# Patient Record
Sex: Female | Born: 1960
Health system: Southern US, Community
[De-identification: ages and names within clinical notes are randomized; demographics above are authoritative.]

## PROBLEM LIST (undated history)

## (undated) DIAGNOSIS — G4730 Sleep apnea, unspecified: Secondary | ICD-10-CM

## (undated) DIAGNOSIS — J3801 Paralysis of vocal cords and larynx, unilateral: Secondary | ICD-10-CM

## (undated) DIAGNOSIS — T8859XA Other complications of anesthesia, initial encounter: Secondary | ICD-10-CM

## (undated) DIAGNOSIS — I10 Essential (primary) hypertension: Secondary | ICD-10-CM

## (undated) DIAGNOSIS — M5416 Radiculopathy, lumbar region: Secondary | ICD-10-CM

## (undated) DIAGNOSIS — L729 Follicular cyst of the skin and subcutaneous tissue, unspecified: Secondary | ICD-10-CM

## (undated) DIAGNOSIS — M1990 Unspecified osteoarthritis, unspecified site: Secondary | ICD-10-CM

## (undated) DIAGNOSIS — E079 Disorder of thyroid, unspecified: Secondary | ICD-10-CM

## (undated) DIAGNOSIS — R011 Cardiac murmur, unspecified: Secondary | ICD-10-CM

## (undated) DIAGNOSIS — F32A Depression, unspecified: Secondary | ICD-10-CM

## (undated) DIAGNOSIS — J40 Bronchitis, not specified as acute or chronic: Secondary | ICD-10-CM

## (undated) DIAGNOSIS — I509 Heart failure, unspecified: Secondary | ICD-10-CM

## (undated) DIAGNOSIS — T4145XA Adverse effect of unspecified anesthetic, initial encounter: Secondary | ICD-10-CM

## (undated) DIAGNOSIS — E039 Hypothyroidism, unspecified: Secondary | ICD-10-CM

## (undated) DIAGNOSIS — J45909 Unspecified asthma, uncomplicated: Secondary | ICD-10-CM

## (undated) DIAGNOSIS — K219 Gastro-esophageal reflux disease without esophagitis: Secondary | ICD-10-CM

## (undated) DIAGNOSIS — F329 Major depressive disorder, single episode, unspecified: Secondary | ICD-10-CM

## (undated) DIAGNOSIS — E119 Type 2 diabetes mellitus without complications: Secondary | ICD-10-CM

## (undated) DIAGNOSIS — J3089 Other allergic rhinitis: Secondary | ICD-10-CM

## (undated) HISTORY — PX: BREAST CYST ASPIRATION: SHX578

## (undated) HISTORY — PX: CYSTECTOMY: SUR359

## (undated) HISTORY — DX: Unspecified asthma, uncomplicated: J45.909

## (undated) HISTORY — DX: Other allergic rhinitis: J30.89

## (undated) HISTORY — PX: OTHER SURGICAL HISTORY: SHX169

## (undated) HISTORY — DX: Radiculopathy, lumbar region: M54.16

## (undated) HISTORY — PX: COLONOSCOPY: SHX5424

## (undated) HISTORY — DX: Heart failure, unspecified: I50.9

## (undated) HISTORY — PX: CHOLECYSTECTOMY: SHX55

---
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
## 1998-01-09 ENCOUNTER — Emergency Department (HOSPITAL_COMMUNITY): Admission: EM | Admit: 1998-01-09 | Discharge: 1998-01-09 | Payer: Self-pay | Admitting: Emergency Medicine

## 1998-05-30 ENCOUNTER — Encounter: Payer: Self-pay | Admitting: Emergency Medicine

## 1998-05-30 ENCOUNTER — Emergency Department (HOSPITAL_COMMUNITY): Admission: EM | Admit: 1998-05-30 | Discharge: 1998-05-30 | Payer: Self-pay | Admitting: Emergency Medicine

## 2006-02-21 ENCOUNTER — Emergency Department (HOSPITAL_COMMUNITY): Admission: EM | Admit: 2006-02-21 | Discharge: 2006-02-21 | Payer: Self-pay | Admitting: Family Medicine

## 2006-05-01 ENCOUNTER — Emergency Department (HOSPITAL_COMMUNITY): Admission: EM | Admit: 2006-05-01 | Discharge: 2006-05-01 | Payer: Self-pay | Admitting: Family Medicine

## 2006-05-10 ENCOUNTER — Ambulatory Visit: Payer: Self-pay | Admitting: Nurse Practitioner

## 2006-05-12 ENCOUNTER — Ambulatory Visit (HOSPITAL_COMMUNITY): Admission: RE | Admit: 2006-05-12 | Discharge: 2006-05-12 | Payer: Self-pay | Admitting: Nurse Practitioner

## 2006-05-14 ENCOUNTER — Ambulatory Visit: Payer: Self-pay | Admitting: *Deleted

## 2006-05-22 ENCOUNTER — Ambulatory Visit: Payer: Self-pay | Admitting: Nurse Practitioner

## 2006-06-04 ENCOUNTER — Ambulatory Visit (HOSPITAL_COMMUNITY): Admission: RE | Admit: 2006-06-04 | Discharge: 2006-06-04 | Payer: Self-pay | Admitting: Family Medicine

## 2006-06-05 ENCOUNTER — Ambulatory Visit (HOSPITAL_COMMUNITY): Admission: RE | Admit: 2006-06-05 | Discharge: 2006-06-05 | Payer: Self-pay | Admitting: Family Medicine

## 2006-06-07 ENCOUNTER — Ambulatory Visit: Payer: Self-pay | Admitting: Nurse Practitioner

## 2006-06-18 ENCOUNTER — Ambulatory Visit: Payer: Self-pay | Admitting: Nurse Practitioner

## 2006-06-26 ENCOUNTER — Emergency Department (HOSPITAL_COMMUNITY): Admission: EM | Admit: 2006-06-26 | Discharge: 2006-06-26 | Payer: Self-pay | Admitting: Emergency Medicine

## 2006-07-03 ENCOUNTER — Ambulatory Visit: Payer: Self-pay | Admitting: Nurse Practitioner

## 2006-07-12 ENCOUNTER — Inpatient Hospital Stay (HOSPITAL_COMMUNITY): Admission: RE | Admit: 2006-07-12 | Discharge: 2006-07-13 | Payer: Self-pay | Admitting: Neurosurgery

## 2006-08-23 ENCOUNTER — Encounter: Payer: Self-pay | Admitting: Obstetrics and Gynecology

## 2006-08-23 ENCOUNTER — Ambulatory Visit: Payer: Self-pay | Admitting: Obstetrics and Gynecology

## 2006-08-27 ENCOUNTER — Ambulatory Visit (HOSPITAL_COMMUNITY): Admission: RE | Admit: 2006-08-27 | Discharge: 2006-08-27 | Payer: Self-pay | Admitting: Family Medicine

## 2006-09-06 ENCOUNTER — Ambulatory Visit: Payer: Self-pay | Admitting: Obstetrics & Gynecology

## 2006-09-24 ENCOUNTER — Ambulatory Visit: Payer: Self-pay | Admitting: Obstetrics & Gynecology

## 2006-09-24 ENCOUNTER — Ambulatory Visit (HOSPITAL_COMMUNITY): Admission: RE | Admit: 2006-09-24 | Discharge: 2006-09-24 | Payer: Self-pay | Admitting: Obstetrics & Gynecology

## 2006-09-24 ENCOUNTER — Encounter: Payer: Self-pay | Admitting: Obstetrics & Gynecology

## 2006-10-10 ENCOUNTER — Ambulatory Visit: Payer: Self-pay | Admitting: Obstetrics & Gynecology

## 2006-11-21 ENCOUNTER — Encounter (INDEPENDENT_AMBULATORY_CARE_PROVIDER_SITE_OTHER): Payer: Self-pay | Admitting: Nurse Practitioner

## 2006-11-21 ENCOUNTER — Ambulatory Visit: Payer: Self-pay | Admitting: Internal Medicine

## 2006-11-21 LAB — CONVERTED CEMR LAB
AST: 15 units/L (ref 0–37)
BUN: 12 mg/dL (ref 6–23)
Calcium: 9.1 mg/dL (ref 8.4–10.5)
Chloride: 104 meq/L (ref 96–112)
Creatinine, Ser: 0.94 mg/dL (ref 0.40–1.20)
TSH: 32.967 microintl units/mL — ABNORMAL HIGH (ref 0.350–5.50)

## 2007-05-05 ENCOUNTER — Emergency Department (HOSPITAL_COMMUNITY): Admission: EM | Admit: 2007-05-05 | Discharge: 2007-05-05 | Payer: Self-pay | Admitting: Family Medicine

## 2007-05-09 ENCOUNTER — Ambulatory Visit: Payer: Self-pay | Admitting: Internal Medicine

## 2007-05-27 ENCOUNTER — Encounter (INDEPENDENT_AMBULATORY_CARE_PROVIDER_SITE_OTHER): Payer: Self-pay | Admitting: Family Medicine

## 2007-05-27 ENCOUNTER — Ambulatory Visit: Payer: Self-pay | Admitting: Internal Medicine

## 2007-05-27 LAB — CONVERTED CEMR LAB
ALT: 37 units/L — ABNORMAL HIGH (ref 0–35)
AST: 23 units/L (ref 0–37)
Albumin: 4.3 g/dL (ref 3.5–5.2)
BUN: 15 mg/dL (ref 6–23)
Calcium: 9.5 mg/dL (ref 8.4–10.5)
Chloride: 100 meq/L (ref 96–112)
Potassium: 4.1 meq/L (ref 3.5–5.3)
TSH: 3.132 microintl units/mL (ref 0.350–5.50)
Total Protein: 7.4 g/dL (ref 6.0–8.3)

## 2007-06-25 ENCOUNTER — Ambulatory Visit (HOSPITAL_COMMUNITY): Admission: RE | Admit: 2007-06-25 | Discharge: 2007-06-25 | Payer: Self-pay | Admitting: Family Medicine

## 2007-08-09 ENCOUNTER — Encounter (INDEPENDENT_AMBULATORY_CARE_PROVIDER_SITE_OTHER): Payer: Self-pay | Admitting: Nurse Practitioner

## 2007-08-09 ENCOUNTER — Ambulatory Visit: Payer: Self-pay | Admitting: Internal Medicine

## 2007-08-09 LAB — CONVERTED CEMR LAB
Basophils Absolute: 0 10*3/uL (ref 0.0–0.1)
Basophils Relative: 0 % (ref 0–1)
Cholesterol: 219 mg/dL — ABNORMAL HIGH (ref 0–200)
Eosinophils Absolute: 0.2 10*3/uL (ref 0.0–0.7)
Eosinophils Relative: 2 % (ref 0–5)
HCT: 42.2 % (ref 36.0–46.0)
HDL: 71 mg/dL (ref >39)
Hemoglobin: 13.3 g/dL (ref 12.0–15.0)
LDL Cholesterol: 129 mg/dL — ABNORMAL HIGH (ref 0–99)
Lymphocytes Relative: 27 % (ref 12–46)
Lymphs Abs: 2.7 10*3/uL (ref 0.7–4.0)
MCHC: 31.5 g/dL (ref 30.0–36.0)
MCV: 94.4 fL (ref 78.0–100.0)
Monocytes Absolute: 0.4 10*3/uL (ref 0.1–1.0)
Monocytes Relative: 4 % (ref 3–12)
Neutro Abs: 6.8 10*3/uL (ref 1.7–7.7)
Neutrophils Relative %: 67 % (ref 43–77)
Platelets: 371 10*3/uL (ref 150–400)
RBC: 4.47 M/uL (ref 3.87–5.11)
RDW: 15.9 % — ABNORMAL HIGH (ref 11.5–15.5)
Total CHOL/HDL Ratio: 3.1
Triglycerides: 96 mg/dL (ref <150)
VLDL: 19 mg/dL (ref 0–40)
WBC: 10.2 10*3/uL (ref 4.0–10.5)

## 2007-08-14 ENCOUNTER — Ambulatory Visit (HOSPITAL_COMMUNITY): Admission: RE | Admit: 2007-08-14 | Discharge: 2007-08-14 | Payer: Self-pay | Admitting: Family Medicine

## 2007-11-17 ENCOUNTER — Emergency Department (HOSPITAL_COMMUNITY): Admission: EM | Admit: 2007-11-17 | Discharge: 2007-11-17 | Payer: Self-pay | Admitting: Family Medicine

## 2008-02-03 ENCOUNTER — Ambulatory Visit: Payer: Self-pay | Admitting: Internal Medicine

## 2008-02-03 ENCOUNTER — Encounter (INDEPENDENT_AMBULATORY_CARE_PROVIDER_SITE_OTHER): Payer: Self-pay | Admitting: Internal Medicine

## 2008-02-03 LAB — CONVERTED CEMR LAB
Alkaline Phosphatase: 117 units/L (ref 39–117)
Basophils Absolute: 0 10*3/uL (ref 0.0–0.1)
CO2: 20 meq/L (ref 19–32)
Creatinine, Ser: 1.27 mg/dL — ABNORMAL HIGH (ref 0.40–1.20)
Eosinophils Absolute: 0.1 10*3/uL (ref 0.0–0.7)
Eosinophils Relative: 1 % (ref 0–5)
Glucose, Bld: 67 mg/dL — ABNORMAL LOW (ref 70–99)
HCT: 42 % (ref 36.0–46.0)
Hemoglobin: 14.1 g/dL (ref 12.0–15.0)
Lymphocytes Relative: 34 % (ref 12–46)
Lymphs Abs: 3.9 10*3/uL (ref 0.7–4.0)
MCV: 91.5 fL (ref 78.0–100.0)
Monocytes Absolute: 0.6 10*3/uL (ref 0.1–1.0)
RDW: 15.3 % (ref 11.5–15.5)
TSH: 8.726 microintl units/mL — ABNORMAL HIGH (ref 0.350–4.50)
Total Bilirubin: 0.2 mg/dL — ABNORMAL LOW (ref 0.3–1.2)

## 2008-02-10 ENCOUNTER — Ambulatory Visit (HOSPITAL_COMMUNITY): Admission: RE | Admit: 2008-02-10 | Discharge: 2008-02-10 | Payer: Self-pay | Admitting: Internal Medicine

## 2008-03-30 ENCOUNTER — Ambulatory Visit: Payer: Self-pay | Admitting: Internal Medicine

## 2008-03-30 ENCOUNTER — Encounter (INDEPENDENT_AMBULATORY_CARE_PROVIDER_SITE_OTHER): Payer: Self-pay | Admitting: Internal Medicine

## 2008-03-30 LAB — CONVERTED CEMR LAB
Cholesterol: 207 mg/dL — ABNORMAL HIGH (ref 0–200)
HDL: 62 mg/dL (ref >39)
Total CHOL/HDL Ratio: 3.3

## 2008-04-21 ENCOUNTER — Ambulatory Visit (HOSPITAL_COMMUNITY): Admission: RE | Admit: 2008-04-21 | Discharge: 2008-04-21 | Payer: Self-pay | Admitting: Internal Medicine

## 2008-04-27 ENCOUNTER — Ambulatory Visit: Payer: Self-pay | Admitting: Internal Medicine

## 2008-05-27 ENCOUNTER — Emergency Department (HOSPITAL_COMMUNITY): Admission: EM | Admit: 2008-05-27 | Discharge: 2008-05-27 | Payer: Self-pay | Admitting: Family Medicine

## 2008-06-02 ENCOUNTER — Ambulatory Visit (HOSPITAL_COMMUNITY): Admission: RE | Admit: 2008-06-02 | Discharge: 2008-06-02 | Payer: Self-pay | Admitting: Internal Medicine

## 2008-06-03 ENCOUNTER — Emergency Department (HOSPITAL_COMMUNITY): Admission: EM | Admit: 2008-06-03 | Discharge: 2008-06-03 | Payer: Self-pay | Admitting: Emergency Medicine

## 2008-06-05 ENCOUNTER — Ambulatory Visit: Payer: Self-pay | Admitting: Internal Medicine

## 2008-07-08 ENCOUNTER — Ambulatory Visit: Payer: Self-pay | Admitting: Internal Medicine

## 2008-07-20 IMAGING — CR DG CHEST 2V
2 series · 2 of 2 positions shown · non-contrast
Comparison: none

CLINICAL DATA: Pre-op for lumbar spine surgery.
 CHEST - 2 VIEWS:

[view not recorded (1 of 2)]
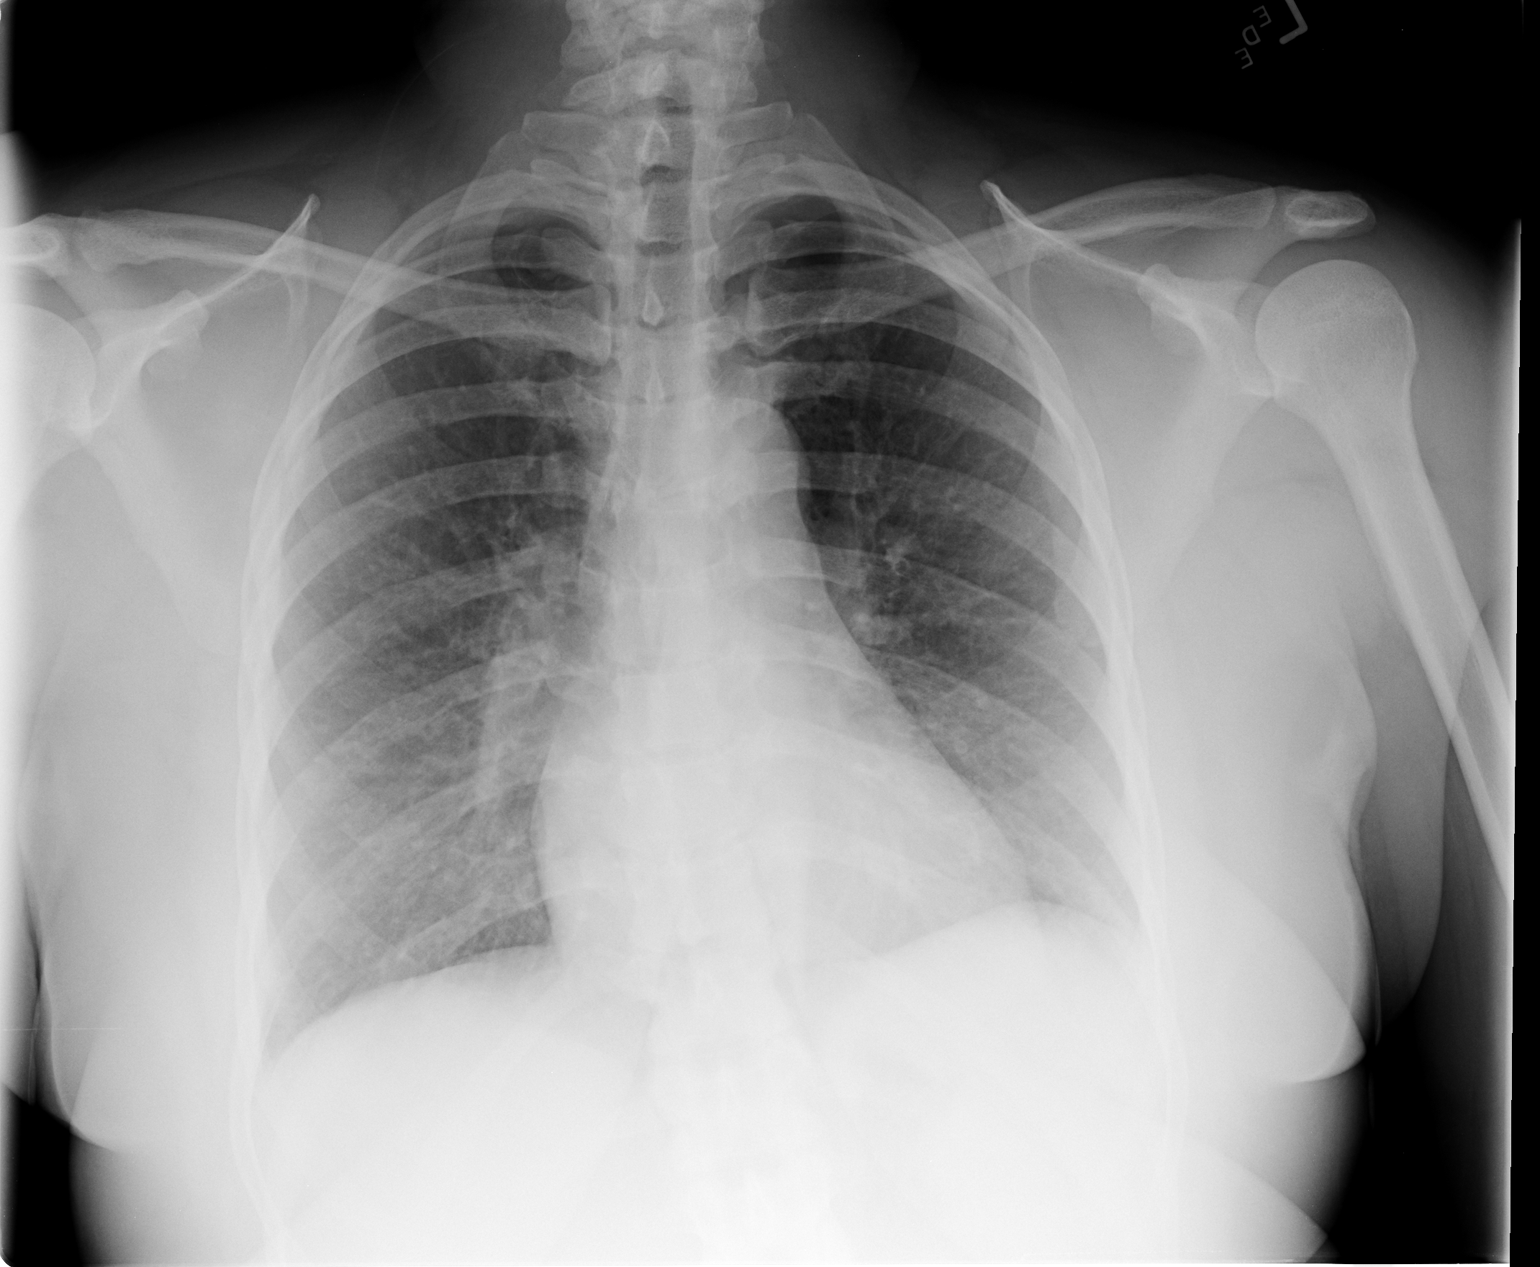

[view not recorded (2 of 2)]
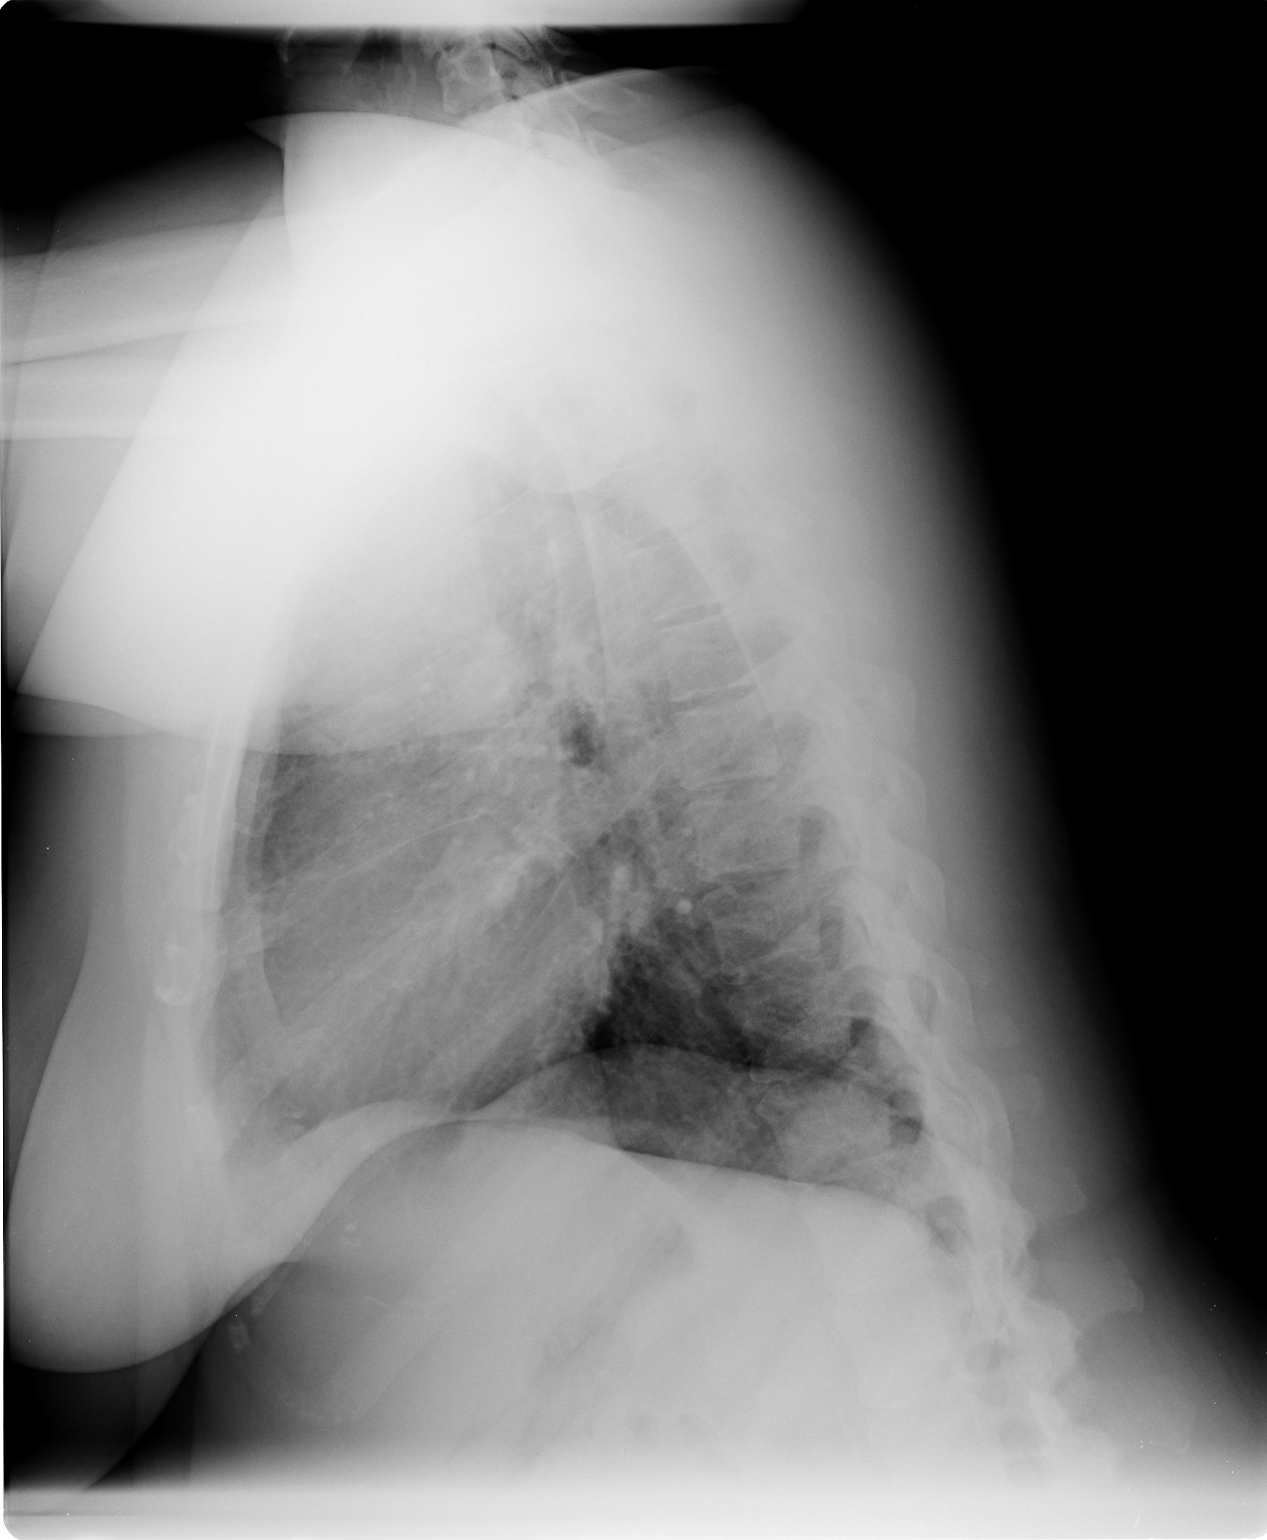

[2 of 2 positions shown; findings below may reference images not displayed]

FINDINGS: Two views of the chest show no pneumonia. The heart is within normal limits in size.  Mild peribronchial thickening is noted.  The bony thorax is intact.
IMPRESSION: No active lung disease.

## 2008-08-24 ENCOUNTER — Ambulatory Visit (HOSPITAL_COMMUNITY): Admission: RE | Admit: 2008-08-24 | Discharge: 2008-08-24 | Payer: Self-pay | Admitting: Internal Medicine

## 2008-09-25 ENCOUNTER — Ambulatory Visit: Payer: Self-pay | Admitting: Internal Medicine

## 2008-09-29 ENCOUNTER — Encounter (INDEPENDENT_AMBULATORY_CARE_PROVIDER_SITE_OTHER): Payer: Self-pay | Admitting: Internal Medicine

## 2008-09-29 ENCOUNTER — Ambulatory Visit: Payer: Self-pay | Admitting: Internal Medicine

## 2008-10-16 ENCOUNTER — Ambulatory Visit: Payer: Self-pay | Admitting: Internal Medicine

## 2009-12-11 ENCOUNTER — Emergency Department (HOSPITAL_COMMUNITY): Admission: EM | Admit: 2009-12-11 | Discharge: 2009-12-12 | Payer: Self-pay | Admitting: Emergency Medicine

## 2010-03-20 ENCOUNTER — Encounter: Payer: Self-pay | Admitting: Internal Medicine

## 2010-03-20 ENCOUNTER — Encounter: Payer: Self-pay | Admitting: Family Medicine

## 2010-05-11 LAB — RAPID URINE DRUG SCREEN, HOSP PERFORMED
Amphetamines: NOT DETECTED
Benzodiazepines: NOT DETECTED
Cocaine: POSITIVE — AB
Tetrahydrocannabinol: NOT DETECTED

## 2010-05-11 LAB — COMPREHENSIVE METABOLIC PANEL
ALT: 19 U/L (ref 0–35)
Albumin: 4 g/dL (ref 3.5–5.2)
Calcium: 9.5 mg/dL (ref 8.4–10.5)
Glucose, Bld: 99 mg/dL (ref 70–99)
Sodium: 140 mEq/L (ref 135–145)
Total Protein: 7.2 g/dL (ref 6.0–8.3)

## 2010-05-11 LAB — DIFFERENTIAL
Lymphs Abs: 1.6 10*3/uL (ref 0.7–4.0)
Monocytes Relative: 4 % (ref 3–12)
Neutro Abs: 5.7 10*3/uL (ref 1.7–7.7)
Neutrophils Relative %: 73 % (ref 43–77)

## 2010-05-11 LAB — CBC
MCH: 32.6 pg (ref 26.0–34.0)
MCHC: 34.6 g/dL (ref 30.0–36.0)
Platelets: 279 10*3/uL (ref 150–400)
RDW: 15 % (ref 11.5–15.5)

## 2010-05-11 LAB — POCT PREGNANCY, URINE: Preg Test, Ur: NEGATIVE

## 2010-05-17 ENCOUNTER — Inpatient Hospital Stay (INDEPENDENT_AMBULATORY_CARE_PROVIDER_SITE_OTHER)
Admission: RE | Admit: 2010-05-17 | Discharge: 2010-05-17 | Disposition: A | Discharge status: Home / Self Care | Payer: Medicaid Other | Source: Ambulatory Visit | Attending: Family Medicine | Admitting: Family Medicine

## 2010-05-17 DIAGNOSIS — J40 Bronchitis, not specified as acute or chronic: Secondary | ICD-10-CM

## 2010-07-12 NOTE — Assessment & Plan Note (Signed)
NAME:  KELSEA, MOUSEL NO.:  [id number]   MEDICAL RECORD NO.:  [id number]          PATIENT TYPE:  WOC   LOCATION:  [facility]         FACILITY:  [facility]   PHYSICIAN:  Allie Bossier, MD        DATE OF BIRTH:  20-Sep-1960   DATE OF SERVICE:                                  CLINIC NOTE   Ms. Greenhalgh is a 50 year old African-American gravida 1, para 1 with a 40-  year-old child.  She has had lower back problems that required surgery.  A preoperative MRI noted an ovarian cyst on the right ovary, one of  which measured 4 cm and was complex.  This was done in April.  She had  her back surgery in May and has been doing well in that respect;  however, a followup ultrasound done on August 27, 2006 showed an  essentially unchanged right adnexal mass.  This is described as complex  and is probably related more to the tube than the ovary.  I have  discussed with Ms. Bangerter watchful waiting versus laparoscopic removal.  She wishes to have the mass removed.  Please note a CA125 was 11 and FSH  and LH were normal.   PAST MEDICAL HISTORY:  Obesity, hypertension, hypothyroidism,  hypercholesterolemia and she is a smoker.   PAST SURGICAL HISTORY:  She has had back surgery May 2008.  She has had  bilateral breast cysts removed and a cholecystectomy.   SOCIAL HISTORY:  She smokes a pack a day.  She says that until June 2008  she was a social alcohol drinker, but she has quit since then.   No latex allergies.  No known drug allergies.   FAMILY HISTORY:  Noncontributory.   REVIEW OF SYSTEMS:  She had a Pap smear done last month that was normal.  She reports some right-sided back pain, this is uncertain whether this  is related to her back or her pelvis.  A very important note is that she  has not had a period since January 2008.  Ultrasound certainly did not  show any pregnancy.  LH and FSH were normal and I have ordered a TSH to  be checked today.  Prior to her period in January  2008 her periods were  approximately every 3 months.  She does wish to be pregnant and is in  fact having unprotected intercourse.   PHYSICAL EXAMINATION:  Weight 232 pounds.  Height 5 feet 4 inches.  Blood pressure 139/82, pulse 80, temperature 98.3.  HEENT:  Normal.  HEART:  Regular rate and rhythm.  LUNGS:  Clear to auscultation bilaterally.  ABDOMEN:  Benign.  EXTERNAL GENITALIA:  Normal.  PELVIC:  Exam limited due to patient's habitus.  Right adnexal fullness  appreciated.   ASSESSMENT AND PLAN:  Symptomatic right adnexal mass.  Will plan for  laparoscopic removal.  Preoperative labs will include a pregnancy test  and a TSH.      Allie Bossier, MD     MCD/MEDQ  D:  09/06/2006  T:  09/07/2006  Job:  [id number]

## 2010-07-12 NOTE — Group Therapy Note (Signed)
NAME:  [name], YOUNGBERG NO.:  [id number]   MEDICAL RECORD NO.:  [id number]          PATIENT TYPE:  WOC   LOCATION:  [facility]                   FACILITY:  [facility]   PHYSICIAN:  Argentina Donovan, MD        DATE OF BIRTH:  [birth year]   DATE OF SERVICE:                                  CLINIC NOTE   The patient is a 50 year old African American female, gravida 1 para 1-0-  0-1 with a child 66 years old.  She had a low back problem that required  surgery and when they did the MRI they noticed ovarian cysts on the  right ovary, one measuring 4 cm in diameter and somewhat complex.  This  was back in April.  Her surgery was in May and been very successful.  She comes in for [place] for reevaluation.  Her mammogram was 4 months ago,  she has been over a year since a Pap smear because she could not be  examined because of the severe pain prior to her orthopedic surgery.   EXAMINATION:  ABDOMEN:  Soft, somewhat looking like it is distended but  nontender, no masses or organomegaly palpated.  EXTERNAL GENITALIA:  Normal, BUS is within normal limits.  Vagina is  clean and well rugated.  The cervix is clean and parous and a Pap smear  was taken.  The uterus is anterior, is upper limits of normal size,  shape.  The adnexa could not be well outlined.  I discussed with the  patient the plan.  She has been amenorrhea for 1 year and has occasional  hot flashes so I am going to get an FSH, LH, a prolactin level.  I am  also going to get a CA-125 and follow-up ultrasound to see if those  cysts are still there and whether anything has to be done with them.  She denies as far as review of systems, her only complaints have been  weight gain, night sweats, and some myalgia.  She is on medication for  hypertension, hypercholesteremia, GERD, and hypothyroidism.   IMPRESSION:  1. Ovarian cyst to be followed.  2. Normal gynecological exam.  Pap smear done.  3. CA-125 to evaluate ovarian cyst.          ______________________________  Argentina Donovan, MD     PR/MEDQ  D:  08/23/2006  T:  08/23/2006  Job:  [id number]

## 2010-07-12 NOTE — Op Note (Signed)
[name], [name]                  ACCOUNT NO.:  [id number]   MEDICAL RECORD NO.:  [id number]          PATIENT TYPE:  INP   LOCATION:  6741                         FACILITY:  [facility]   PHYSICIAN:  Hewitt Shorts, M.D.DATE OF BIRTH:  September 05, 1960   DATE OF PROCEDURE:  07/12/2006  DATE OF DISCHARGE:                               OPERATIVE REPORT   PREOPERATIVE DIAGNOSES:  L5-S1 lumbar disk herniation, lumbar  degenerative disk disease, lumbar spondylosis and lumbar radiculopathy.   PREOPERATIVE DIAGNOSES:  L5-S1 lumbar disk herniation, lumbar  degenerative disk disease, lumbar spondylosis and lumbar radiculopathy.   PROCEDURE:  Bilateral L5-S1 lumbar laminotomy and microdiskectomy with  microdissection.   SURGEON:  Hewitt Shorts, M.D.   ASSISTANT:  Hilda Lias, M.D.   ANESTHESIA:  General endotracheal.   INDICATIONS:  The patient is a 50 year old woman, who presented with a  disabling left lumbar radiculopathy and was found to have an enormous L5-  S1 lumbar disk herniation located centrally and extending bilaterally,  left worse than right, with near complete radiographic block by MRI scan  at the L5-S1 level.  The decision was made to proceed with bilateral  lumbar laminotomy and microdiskectomy.   DESCRIPTION OF PROCEDURE:  The patient was brought to the operating  room, placed under general endotracheal anesthesia.  The patient was  turned to a prone position.  The lumbar region was prepped with Betadine  Soap and Solution and draped in a sterile fashion.  The midline was  infiltrated with local anesthetic with epinephrine, and an x-ray was  taken and the L5-S1 level identified.  A midline incision was made and  carried down through the subcutaneous tissue with bipolar cautery, and  electrocautery used to maintain hemostasis.  Dissection was carried down  to the lumbar fascia, which was incised bilaterally, and the paraspinal  muscles were dissected from the  spinous processes and laminae in  subperiosteal fashion.  A self-retaining retractor was placed and the L5-  S1 interlaminar space was identified.  An x-ray was taken to confirm the  localization, and then the microscope was draped and brought into the  field to provide additional magnification, illumination and  visualization, and the remainder of the decompression was performed  using microdissection and microsurgical technique.  Bilateral laminotomy  was performed using the XMax drill and Kerrison punches.  The ligamentum  flavum was carefully removed, and we identified the thecal sac and  exiting S1 nerve roots bilaterally.  As we examined the ventral epidural  space, there was a large ventral mass.  We entered into the lateral  aspect of the annulus.  It was incised with a 15 scalpel, and we  gradually proceeded with a diskectomy, beginning to remove some of the  degenerative disk material from within the disk space, and then  gradually moving medially, using a variety of microcurets to mobilize  the enormous disk herniation.  We were able to remove the disk  herniation in a piecemeal fashion, with several very large fragments of  disk material removed that had herniated and were causing severe thecal  sac and nerve root compression.  In the end, the annulus was opened  bilaterally at L5-S1, and we were able to proceed with a thorough  diskectomy using a variety of microcurets and pituitary rongeurs.  Good  decompression of the thecal sac and nerve roots was achieved, and once  the diskectomy was completed, hemostasis was established using bipolar  cautery.  Once good hemostasis was established, the wound was irrigated  extensively with Bacitracin solution, as it had been done several times  during the procedure.  And then we instilled 2 mL of fentanyl and 80 mg  of Depo-Medrol into the epidural space, and proceeded with closure.  The  deep fascia was closed with interrupted, undyed 1  Vicryl suture.  The  Scarpa fascia was closed with interrupted undyed 1 Vicryl sutures.  The  subcutaneous and subcuticular layers were closed with interrupted,  inverted 2-0 and 3-0 undyed Vicryl sutures, and the skin edges were  approximated with Dermabond.  The procedure was tolerated well.  The  estimated blood loss was 50 mL.  Sponge and needle counts were correct.  Following surgery, the patient was to be turned back to a supine  position, reversed from the anesthetic, extubated and transferred to the  recovery room for further care.      Hewitt Shorts, M.D.  Electronically Signed     RWN/MEDQ  D:  07/12/2006  T:  07/13/2006  Job:  [id number]

## 2010-07-15 NOTE — Op Note (Signed)
NAMEHANAKO, [name]                  ACCOUNT NO.:  [id number]   MEDICAL RECORD NO.:  [id number]          PATIENT TYPE:  AMB   LOCATION:  [facility]                           [facility]   PHYSICIAN:  Allie Bossier, MD        DATE OF BIRTH:  [birth year]   DATE OF PROCEDURE:  09/25/2006  DATE OF DISCHARGE:                               OPERATIVE REPORT   PREOPERATIVE DIAGNOSES:  1. Pelvic pain.  2. Right adnexal mass.   POSTOPERATIVE DIAGNOSES:  1. Pelvic pain.  2. Right adnexal mass.   SURGEON:  Allie Bossier, M.D.   ANESTHESIA:  GETA, Angelica Pou, M.D.   COMPLICATIONS:  None.   ESTIMATED BLOOD LOSS:  Minimal.   SPECIMENS:  Cystic fluid.   FINDINGS:  1. Normal upper abdomen.  2. Normal appendix.  3. Dense adhesions of the bowel to the posterior aspect of the uterus      and both ovaries.  4. Very large cystic structure associated with the right pelvic side      wall and right ova duct.   DESCRIPTION OF PROCEDURE:  The risks, benefits and alternatives of  surgery were explained.  The accept consents were signed.  Her pregnancy  test was negative.  She was taken to the operating room and placed in  the dorsal lithotomy position.  Once she reported herself to be  comfortable, gentle anesthesia was applied without complications.  Great  care was taken in adjusting her in the lithotomy position due to her  history of back pain.  Her abdomen and vagina were prepped and draped in  the usual sterile fashion.  Her bladder was emptied with a Robinson  catheter.  A bimanual exam revealed a fixed mid-plane uterus and non-  enlarged adnexa.  Please note that the exam was limited by her body  habitus.  A Hulka manipulator was placed.  Gloves were changed.  Attention was turned to the abdomen.  A vertical umbilical incision was  made after injecting the umbilical area with approximately 8 mL of 0.25%  Marcaine.  A Veress needle was placed in the incision.  Low-flow CO2 was  used to  insufflate the abdomen with approximately 3 liters.  The  patient's abdominal pressure was always less than 12.  A 5 mm port was  placed in each lower quadrant under direct laparoscopic visualization,  taking care to avoid the inferior epigastric vessels.  An 11 mm trocar  was placed.  A laparoscopy confirmed the correct placement.  She was  placed into the steep Trendelenburg position to move the very large  amount of bowel out of the pelvis.  My initial impression was dense  adhesions throughout the pelvis and a large cystic mass arising from the  right pelvic side wall in conjunction with the right ova duct.  The  upper abdomen was  normal and the appendix was visualized and noted to  be normal as well.  There were dense adhesions of the posterior cul-de-  sac.  Using an electrosurgical technique, I carefully removed the  adhesions encasing the right ovary and left ovary.  Great care was taken  to avoid the adherent bowel.  I was well away from the ureters at all  times.  The ovary on the left had a long hemorrhagic cyst.  This was  opened and drained.  Once I had the adhesions free, I was attentive to  the right ova duct.  I was able to see the beginning of the ova duct at  its insertion into the uterus.  At no point was I able to find the  fimbria.  The ova duct seemed to become/join the large cyst that was  attached to the side wall.  I used a needle to drain this of  approximately  60 mL of brownish/greenish liquid.  The cyst was deflated  at the end of the removal of this fluid and was hemostatic.  At this  point the adhesions were all free.  The cyst was drained.  There were no  other procedures that needed to be done.  Please note that the ova duct  from the left was somewhat agglutinated on the left ovary.  At the  completion of the case I removed the 5 mm ports.  No bleeding was noted  from their sites.  CO2 was allowed to escape from the abdomen and the  port was removed from  the umbilicus.  The remainder of the Marcaine was  injected into the 5 mm port sites.  Subcuticular closures were done with  #4-0 Vicryl at all three sites.  The Hulka manipulator was removed.  The  instrument, sponge and needle counts were correct.   She was extubated and taken to the recovery room in stable condition.  She tolerated the procedure well.      Allie Bossier, MD  Electronically Signed     MCD/MEDQ  D:  09/24/2006  T:  09/25/2006  Job:  [id number]

## 2010-11-21 LAB — CBC
Hemoglobin: 12.9
MCHC: 34.4
MCV: 91.8
RBC: 4.09

## 2010-11-21 LAB — DIFFERENTIAL
Basophils Absolute: 0
Basophils Relative: 0
Eosinophils Absolute: 0.3
Eosinophils Relative: 2
Lymphocytes Relative: 17
Lymphs Abs: 1.9
Monocytes Absolute: 0.5
Monocytes Relative: 4
Neutro Abs: 8.3 — ABNORMAL HIGH
Neutrophils Relative %: 76

## 2010-12-12 LAB — BASIC METABOLIC PANEL
CO2: 23
Calcium: 9.7
Creatinine, Ser: 0.72
GFR calc Af Amer: >60

## 2010-12-12 LAB — HCG, SERUM, QUALITATIVE: Preg, Serum: NEGATIVE

## 2010-12-12 LAB — CBC
MCHC: 33.7
RDW: 14.9 — ABNORMAL HIGH

## 2014-05-11 ENCOUNTER — Encounter: Payer: Self-pay | Admitting: Physical Therapy

## 2014-05-11 ENCOUNTER — Ambulatory Visit: Payer: No Typology Code available for payment source | Attending: Chiropractic Medicine | Admitting: Physical Therapy

## 2014-05-11 DIAGNOSIS — M25511 Pain in right shoulder: Secondary | ICD-10-CM | POA: Diagnosis present

## 2014-05-11 DIAGNOSIS — M545 Low back pain, unspecified: Secondary | ICD-10-CM
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
## 2014-05-18 ENCOUNTER — Ambulatory Visit: Payer: No Typology Code available for payment source | Admitting: Physical Therapy

## 2014-05-18 DIAGNOSIS — M25511 Pain in right shoulder: Secondary | ICD-10-CM | POA: Diagnosis not present

## 2014-05-18 DIAGNOSIS — M545 Low back pain, unspecified: Secondary | ICD-10-CM

## 2014-05-18 NOTE — Therapy (Signed)
[facility] [address] [place], [place], [address] Phone: [phone]   Fax:  [phone]  Physical Therapy Treatment  Patient Details  Name: [name] MRN: [id number] Date of Birth: 12/09/1960 Referring Provider:  Pete GlatterFricke, Jeffrey, DC  Encounter Date: 05/18/2014      PT End of Session - 05/18/14 1045    Visit Number 2   Date for PT Re-Evaluation 07/11/14   PT Start Time 1014   PT Stop Time 1110   PT Time Calculation (min) 56 min   Activity Tolerance Patient tolerated treatment well   Behavior During Therapy [facility] for tasks assessed/performed      Past Medical History  Diagnosis Date  . CHF (congestive heart failure)     Past Surgical History  Procedure Laterality Date  . Back surgery 2007 Bilateral     There were no vitals filed for this visit.  Visit Diagnosis:  Midline low back pain without sciatica      Subjective Assessment - 05/18/14 1019    Symptoms slept on RT should and hurts today. No low back pain unless doing house work and feeding dogs   Currently in Pain? No/denies                       [facility]/Exercise - 05/18/14 0001    Exercises   Exercises Lumbar   Lumbar Exercises: Standing   Heel Raises 10 reps   Forward Lunge 5 reps  functional short step lunges   Lumbar Exercises: Supine   Dead Bug 5 reps;5 seconds   Bridge 20 reps  X 2   Moist Heat Therapy   Number Minutes Moist Heat 15 Minutes   Moist Heat Location Other (comment)  lumbar   Electrical Stimulation   Electrical Stimulation Location low b   Electrical Stimulation Action pain                PT Education - 05/18/14 1044    Education Details also issued hep for bridges          PT Short Term Goals - 05/11/14 1031    PT SHORT TERM GOAL #1   Title independent with initial HEP   Time 2   Period Weeks   Status New           PT Long Term Goals - 05/11/14 1031    PT  LONG TERM GOAL #1   Title understand proper posture and body mechanics   Time 8   Period Weeks   Status New   PT LONG TERM GOAL #2   Title dress and do hair without difficulty   Time 8   Period Weeks   Status New   PT LONG TERM GOAL #3   Title increase ROM of the right shouler ER and IR to 70 degrees   Time 8   Period Weeks   Status New               Plan - 05/18/14 1053    Clinical Impression Statement Pt did well with body mechanics and posture awareness.    Pt will benefit from skilled therapeutic intervention in order to improve on the following deficits Decreased range of motion;Impaired flexibility;Pain;Impaired UE functional use;Decreased mobility;Decreased strength;Increased muscle spasms   PT Frequency 2x / week   PT Duration 8 weeks   PT Treatment/Interventions Electrical Stimulation;Ultrasound;Moist Heat;Therapeutic exercise;Therapeutic activities;Patient/family education;Manual techniques   PT Next Visit  Plan continue with strength and ROM   Consulted and Agree with Plan of Care Patient        Problem List There are no active problems to display for this patient.    Lady Deutscher, PTA  05/18/2014, 10:57 AM  [facility] [address] [place], [place], [address] Phone: [phone]   Fax:  [phone]

## 2014-05-21 ENCOUNTER — Ambulatory Visit: Payer: No Typology Code available for payment source | Admitting: Physical Therapy

## 2014-05-21 DIAGNOSIS — M25511 Pain in right shoulder: Secondary | ICD-10-CM

## 2014-05-21 DIAGNOSIS — M545 Low back pain, unspecified: Secondary | ICD-10-CM

## 2014-05-21 NOTE — Therapy (Signed)
[facility] [place] [place] [address] [place], [place], [address] Phone: [phone]   Fax:  [phone]  Physical Therapy Treatment  Patient Details  Name: [name] MRN: [id number] Date of Birth: 12/16/1960 Referring Provider:  Pete GlatterFricke, Jeffrey, DC  Encounter Date: 05/21/2014      PT End of Session - 05/21/14 1040    Visit Number 3   Date for PT Re-Evaluation 07/11/14   PT Start Time 0957   PT Stop Time 1057   PT Time Calculation (min) 60 min   Activity Tolerance Patient tolerated treatment well   Behavior During Therapy [facility] for tasks assessed/performed      Past Medical History  Diagnosis Date  . CHF (congestive heart failure)     Past Surgical History  Procedure Laterality Date  . Back surgery 2007 Bilateral     There were no vitals filed for this visit.  Visit Diagnosis:  Midline low back pain without sciatica  Right shoulder pain      Subjective Assessment - 05/21/14 0958    Symptoms Nothing is hurting today; so far everything feels good.  Back pain increases with activity and bending. Still going to chiropractor 1x/week   Patient Stated Goals no pain   Currently in Pain? No/denies                       [facility]/Exercise - 05/21/14 0959    Lumbar Exercises: Stretches   Passive Hamstring Stretch 3 reps;30 seconds   Passive Hamstring Stretch Limitations supine with strap   Single Knee to Chest Stretch 3 reps;30 seconds   Lower Trunk Rotation 5 reps;20 seconds   Lumbar Exercises: Aerobic   Stationary Bike NuStep Level 5 x 8 min   Lumbar Exercises: Standing   Scapular Retraction 15 reps;Strengthening;Theraband   Theraband Level (Scapular Retraction) Level 2 (Red)   Shoulder Extension Strengthening;15 reps;Theraband   Theraband Level (Shoulder Extension) Level 2 (Red)   Lumbar Exercises: Supine   Ab Set 10 reps;5 seconds   Clam 10 reps   Clam Limitations alt lower  extremities with ab set   Bridge 5 seconds;20 reps   Modalities   Modalities Electrical Stimulation;Moist Heat   Moist Heat Therapy   Number Minutes Moist Heat 15 Minutes   Moist Heat Location Other (comment)  low back   Electrical Stimulation   Electrical Stimulation Location low back   Electrical Stimulation Parameters IFC   Electrical Stimulation Goals Pain                  PT Short Term Goals - 05/11/14 1031    PT SHORT TERM GOAL #1   Title independent with initial HEP   Time 2   Period Weeks   Status New           PT Long Term Goals - 05/11/14 1031    PT LONG TERM GOAL #1   Title understand proper posture and body mechanics   Time 8   Period Weeks   Status New   PT LONG TERM GOAL #2   Title dress and do hair without difficulty   Time 8   Period Weeks   Status New   PT LONG TERM GOAL #3   Title increase ROM of the right shouler ER and IR to 70 degrees   Time 8   Period Weeks   Status New  Plan - 05/21/14 1040    Clinical Impression Statement Pt reports pain increased with exercises; reports pain described as "muscles workig."  Will continue to benefit from PT to maximize function.   PT Next Visit Plan continue with strength and ROM        Problem List There are no active problems to display for this patient.  Clarita Crane, PT, DPT 05/21/2014 10:59 AM  [facility]- [address] [place] [address] [place], [place], [address] Phone: [phone]   Fax:  [phone]

## 2014-05-25 ENCOUNTER — Encounter: Payer: Self-pay | Admitting: Physical Therapy

## 2014-05-25 ENCOUNTER — Ambulatory Visit: Payer: No Typology Code available for payment source | Admitting: Physical Therapy

## 2014-05-25 DIAGNOSIS — M25511 Pain in right shoulder: Secondary | ICD-10-CM

## 2014-05-25 DIAGNOSIS — M545 Low back pain, unspecified: Secondary | ICD-10-CM

## 2014-05-25 NOTE — Therapy (Signed)
[facility] [place] [place] [address] [place], [place], [address] Phone: [phone]   Fax:  [phone]  Physical Therapy Treatment  Patient Details  Name: Karen BankRose M Brase MRN: [id number] Date of Birth: 12/27/1960 Referring Provider:  Pete GlatterFricke, Jeffrey, DC  Encounter Date: 05/25/2014    Past Medical History  Diagnosis Date  . CHF (congestive heart failure)     Past Surgical History  Procedure Laterality Date  . Back surgery 2007 Bilateral     There were no vitals filed for this visit.  Visit Diagnosis:  Midline low back pain without sciatica  Right shoulder pain      Subjective Assessment - 05/25/14 1012    Currently in Pain? Yes   Pain Score 8    Pain Location Back   Pain Orientation Lower   Pain Descriptors / Indicators Sharp   Pain Type Chronic pain   Pain Onset More than a month ago   Pain Frequency Intermittent            [facility] PT Assessment - 05/25/14 0001    ROM / Strength   AROM / PROM / Strength AROM   AROM   AROM Assessment Site Shoulder   Right/Left Shoulder Right   Right Shoulder Flexion 157 Degrees   Right Shoulder ABduction 165 Degrees   Right Shoulder Internal Rotation 60 Degrees   Right Shoulder External Rotation 90 Degrees                   [facility] Adult PT Treatment/Exercise - 05/25/14 0001    Exercises   Exercises Lumbar;Shoulder   Lumbar Exercises: Aerobic   Stationary Bike NuStep Level 5 x 8 min   Lumbar Exercises: Standing   Other Standing Lumbar Exercises 5# pull to midline 2x10 bilateral   Lumbar Exercises: Seated   Other Seated Lumbar Exercises 25# row 2x15   Other Seated Lumbar Exercises 25# lat pull 2x15   Lumbar Exercises: Supine   Other Supine Lumbar Exercises KTC, rotation with ball  2x15   Shoulder Exercises: Standing   Other Standing Exercises 4# dow rod IR up back 2x10   Modalities   Modalities Electrical Stimulation;Moist Heat   Moist Heat Therapy    Number Minutes Moist Heat 15 Minutes   Moist Heat Location Shoulder  right   Electrical Stimulation   Electrical Stimulation Location right shoulder   Electrical Stimulation Parameters IFC   Electrical Stimulation Goals Pain                PT Education - 05/25/14 1055    Education provided Yes   Education Details Discussed posture, lifting, and body mechanics   Person(s) Educated Patient   Methods Explanation;Demonstration   Comprehension Verbalized understanding          PT Short Term Goals - 05/25/14 1035    PT SHORT TERM GOAL #1   Title independent with initial HEP   Time 2   Period Weeks   Status Achieved           PT Long Term Goals - 05/25/14 1035    PT LONG TERM GOAL #1   Title understand proper posture and body mechanics   Time 8   Period Weeks   Status Achieved   PT LONG TERM GOAL #2   Title dress and do hair without difficulty   Time 8   Period Weeks   Status Achieved   PT LONG TERM GOAL #3   Title increase  ROM of the right shouler ER and IR to 70 degrees   Time 8   Period Weeks   Status On-going               Plan - 05/25/14 1054    Clinical Impression Statement Decreased strength in right shoulder.  Pain increases with activity in both shoulder and lumbar.   Pt will benefit from skilled therapeutic intervention in order to improve on the following deficits Decreased range of motion;Impaired flexibility;Pain;Impaired UE functional use;Decreased mobility;Decreased strength;Increased muscle spasms   Rehab Potential Good   PT Frequency 2x / week   PT Duration 8 weeks   PT Treatment/Interventions Electrical Stimulation;Ultrasound;Moist Heat;Therapeutic exercise;Therapeutic activities;Patient/family education;Manual techniques   PT Next Visit Plan continue with strength and ROM   Consulted and Agree with Plan of Care Patient        Problem List There are no active problems to display for this patient.   Avrom Robarts  PTA 05/25/2014, 10:56 AM  [facility] [place] [address] [place], [place], [address] Phone: [phone]   Fax:  [phone]

## 2014-05-28 ENCOUNTER — Ambulatory Visit: Payer: No Typology Code available for payment source | Admitting: Physical Therapy

## 2014-05-28 ENCOUNTER — Encounter: Payer: Self-pay | Admitting: Physical Therapy

## 2014-05-28 DIAGNOSIS — M545 Low back pain, unspecified: Secondary | ICD-10-CM

## 2014-05-28 DIAGNOSIS — M25511 Pain in right shoulder: Secondary | ICD-10-CM | POA: Diagnosis not present

## 2014-05-28 NOTE — Therapy (Signed)
[facility] [place] [place] [address] [place], [place], [address] Phone: [phone]   Fax:  [phone]  Physical Therapy Treatment  Patient Details  Name: Karen BankRose M Pyka MRN: [id number] Date of Birth: 07/14/1960 Referring Provider:  Pete GlatterFricke, Jeffrey, DC  Encounter Date: 05/28/2014      PT End of Session - 05/28/14 1031    Visit Number 5   PT Start Time 1000   PT Stop Time 1100   PT Time Calculation (min) 60 min      Past Medical History  Diagnosis Date  . CHF (congestive heart failure)     Past Surgical History  Procedure Laterality Date  . Back surgery 2007 Bilateral     There were no vitals filed for this visit.  Visit Diagnosis:  Midline low back pain without sciatica      Subjective Assessment - 05/28/14 1003    Currently in Pain? Yes   Pain Score 2    Pain Location Back   Pain Orientation Lower   Pain Descriptors / Indicators Aching                       [facility] Adult PT Treatment/Exercise - 05/28/14 0001    Lumbar Exercises: Aerobic   Stationary Bike NuStep Level 5 x 8 min   Lumbar Exercises: Machines for Strengthening   Leg Press 40# 2 sets 15   Lumbar Exercises: Standing   Other Standing Lumbar Exercises green tband hip ext and abd 15 times bilaterally   Lumbar Exercises: Seated   Other Seated Lumbar Exercises 25# row 2x15   Other Seated Lumbar Exercises 25# lat pull 2x15   Lumbar Exercises: Supine   Ab Set 10 reps  2 sets seated green tband   Dead Bug 5 reps;5 seconds   Bridge 5 seconds;20 reps   Other Supine Lumbar Exercises KTC, rotation with ball  2x15   Other Supine Lumbar Exercises isometric abs with ball 2 sets 10   Modalities   Modalities Electrical Stimulation   Moist Heat Therapy   Number Minutes Moist Heat 15 Minutes   Moist Heat Location Shoulder  lumbar   Electrical Stimulation   Electrical Stimulation Location --  lumbar   Electrical Stimulation Parameters IFC   Electrical Stimulation Goals Pain                PT Education - 05/28/14 1030    Education provided Yes   Education Details pt stated using Bow Flex, cautioned to use lift weight and good body mech   Person(s) Educated Patient   Methods Explanation;Demonstration   Comprehension Verbalized understanding          PT Short Term Goals - 05/25/14 1035    PT SHORT TERM GOAL #1   Title independent with initial HEP   Time 2   Period Weeks   Status Achieved           PT Long Term Goals - 05/25/14 1035    PT LONG TERM GOAL #1   Title understand proper posture and body mechanics   Time 8   Period Weeks   Status Achieved   PT LONG TERM GOAL #2   Title dress and do hair without difficulty   Time 8   Period Weeks   Status Achieved   PT LONG TERM GOAL #3   Title increase ROM of the right shouler ER and IR to 70 degrees   Time 8  Period Weeks   Status On-going               Plan - 05/28/14 1032    Clinical Impression Statement pt tolerated ther ex well, needs to continue to work on stab ther ex and core strength   PT Next Visit Plan INCREASE HEP        Problem List There are no active problems to display for this patient.   Claris Guymon,ANGIE PTA 05/28/2014, 10:37 AM  [facility] [place] [address] [place], [place], [address] Phone: [phone]   Fax:  [phone]

## 2014-06-02 ENCOUNTER — Ambulatory Visit: Payer: No Typology Code available for payment source | Attending: Chiropractic Medicine | Admitting: Physical Therapy

## 2014-06-02 ENCOUNTER — Encounter: Payer: Self-pay | Admitting: Physical Therapy

## 2014-06-02 DIAGNOSIS — M545 Low back pain, unspecified: Secondary | ICD-10-CM

## 2014-06-02 DIAGNOSIS — M25511 Pain in right shoulder: Secondary | ICD-10-CM | POA: Diagnosis present

## 2014-06-02 NOTE — Therapy (Signed)
[facility] [address] [place], [place], [address] Phone: [phone]   Fax:  [phone]  Physical Therapy Treatment  Patient Details  Name: Karen BankRose M Shaw MRN: [id number] Date of Birth: 11/12/1960 Referring Provider:  Pete GlatterFricke, Jeffrey, DC  Encounter Date: 06/02/2014      PT End of Session - 06/02/14 1119    Visit Number 6   Date for PT Re-Evaluation 07/11/14   PT Start Time 1017   PT Stop Time 1112   PT Time Calculation (min) 55 min   Activity Tolerance Patient tolerated treatment well;Patient limited by fatigue;Patient limited by pain   Behavior During Therapy [facility] for tasks assessed/performed      Past Medical History  Diagnosis Date  . CHF (congestive heart failure)     Past Surgical History  Procedure Laterality Date  . Back surgery 2007 Bilateral     There were no vitals filed for this visit.  Visit Diagnosis:  Midline low back pain without sciatica  Right shoulder pain      Subjective Assessment - 06/02/14 1017    Subjective It hurt when I fed my dogs.   Currently in Pain? No/denies                       [facility] Adult PT Treatment/Exercise - 06/02/14 0001    Exercises   Exercises Lumbar   Lumbar Exercises: Aerobic   Stationary Bike --   Elliptical Nustep level 6 x 6 minutes   Lumbar Exercises: Machines for Strengthening   Cybex Lumbar Extension 15# 2x15   Cybex Knee Extension 35# 2x15   Lumbar Exercises: Supine   Bridge 10 reps  2 sets   Other Supine Lumbar Exercises KTC, rotation with ball                PT Education - 06/02/14 1047    Education provided Yes   Education Details Re-educated patient on body mechanics   Person(s) Educated Patient   Methods Explanation;Demonstration   Comprehension Verbalized understanding          PT Short Term Goals - 05/25/14 1035    PT SHORT TERM GOAL #1   Title independent with initial HEP   Time 2   Period Weeks   Status Achieved           PT Long Term Goals - 05/25/14 1035    PT LONG TERM GOAL #1   Title understand proper posture and body mechanics   Time 8   Period Weeks   Status Achieved   PT LONG TERM GOAL #2   Title dress and do hair without difficulty   Time 8   Period Weeks   Status Achieved   PT LONG TERM GOAL #3   Title increase ROM of the right shouler ER and IR to 70 degrees   Time 8   Period Weeks   Status On-going               Problem List There are no active problems to display for this patient.   Mariely Mahr PTA 06/02/2014, 11:25 AM  [facility] [place] [address] [place], [place], [address] Phone: [phone]   Fax:  [phone]

## 2014-06-04 ENCOUNTER — Ambulatory Visit: Payer: No Typology Code available for payment source | Admitting: Physical Therapy

## 2014-06-08 ENCOUNTER — Ambulatory Visit: Payer: No Typology Code available for payment source | Admitting: Physical Therapy

## 2014-06-08 DIAGNOSIS — M545 Low back pain, unspecified: Secondary | ICD-10-CM

## 2014-06-08 DIAGNOSIS — M25511 Pain in right shoulder: Secondary | ICD-10-CM | POA: Diagnosis not present

## 2014-06-08 NOTE — Therapy (Signed)
[facility] [place] [address] [place], [place], [address] Phone: [phone]   Fax:  [phone]  Physical Therapy Treatment  Patient Details  Name: [name] MRN: [id number] Date of Birth: 03/01/1960 Referring Provider:  Pete GlatterFricke, Jeffrey, DC  Encounter Date: 06/08/2014      PT End of Session - 06/08/14 1058    Visit Number 7   Date for PT Re-Evaluation 07/11/14   PT Start Time 1015   PT Stop Time 1113   PT Time Calculation (min) 58 min   Activity Tolerance Patient tolerated treatment well;Patient limited by fatigue;Patient limited by pain   Behavior During Therapy [facility] for tasks assessed/performed      Past Medical History  Diagnosis Date  . CHF (congestive heart failure)     Past Surgical History  Procedure Laterality Date  . Back surgery 2007 Bilateral     There were no vitals filed for this visit.  Visit Diagnosis:  Midline low back pain without sciatica      Subjective Assessment - 06/08/14 1018    Subjective Back still hurts with walking; reports L 5th toe is going numb when lying down.  Wants to start a walking program.   Pertinent History Reports prior lumbar surgery in 2007   Limitations Sitting;Lifting;House hold activities   How long can you sit comfortably? unlimited   How long can you stand comfortably? unlimited as long as she isn't doing anything   How long can you walk comfortably? 30 min   Diagnostic tests DDD, arthritis   Patient Stated Goals no pain   Currently in Pain? No/denies   Pain Score --  increases to 9/10 with activity (cleaning house)   Pain Location Back   Pain Orientation Lower   Pain Descriptors / Indicators Aching   Pain Type Chronic pain   Pain Onset More than a month ago   Pain Frequency Intermittent                       [facility] Adult PT Treatment/Exercise - 06/08/14 1020    Lumbar Exercises: Stretches   Single Knee to Chest Stretch 3 reps;20  seconds   Single Knee to Chest Stretch Limitations used sheet to help with stretch   Lower Trunk Rotation 3 reps;20 seconds   Lumbar Exercises: Aerobic   Stationary Bike NuStep Level 6 x 8 min   Lumbar Exercises: Seated   Other Seated Lumbar Exercises 25# row 3x10   Other Seated Lumbar Exercises 25# lat pull 3x10   Lumbar Exercises: Supine   Ab Set 20 reps;5 seconds   Bridge 20 reps;5 seconds   Modalities   Modalities Electrical Stimulation;Moist Heat   Moist Heat Therapy   Number Minutes Moist Heat 15 Minutes   Moist Heat Location Other (comment)  low back   Electrical Stimulation   Electrical Stimulation Location low back   Electrical Stimulation Action IFC x 15 min   Electrical Stimulation Parameters to tolerance   Electrical Stimulation Goals Pain                  PT Short Term Goals - 05/25/14 1035    PT SHORT TERM GOAL #1   Title independent with initial HEP   Time 2   Period Weeks   Status Achieved           PT Long Term Goals - 05/25/14 1035    PT LONG TERM GOAL #1  Title understand proper posture and body mechanics   Time 8   Period Weeks   Status Achieved   PT LONG TERM GOAL #2   Title dress and do hair without difficulty   Time 8   Period Weeks   Status Achieved   PT LONG TERM GOAL #3   Title increase ROM of the right shouler ER and IR to 70 degrees   Time 8   Period Weeks   Status On-going               Plan - 06/08/14 1058    Clinical Impression Statement Increased back pain today with increased activity, however pt reports pain improved with stretches.  Will continue to benefit from PT to maximize function.   PT Next Visit Plan INCREASE HEP   Consulted and Agree with Plan of Care Patient        Problem List There are no active problems to display for this patient.  Clarita Crane, PT, DPT 06/08/2014 11:15 AM  [facility]- [address] [place] [address] [place], [place], [address] Phone: [phone]   Fax:  [phone]

## 2014-06-11 ENCOUNTER — Ambulatory Visit: Payer: No Typology Code available for payment source | Admitting: Physical Therapy

## 2014-06-11 ENCOUNTER — Encounter: Payer: Self-pay | Admitting: Physical Therapy

## 2014-06-11 DIAGNOSIS — M25511 Pain in right shoulder: Secondary | ICD-10-CM

## 2014-06-11 DIAGNOSIS — M545 Low back pain, unspecified: Secondary | ICD-10-CM

## 2014-06-11 NOTE — Therapy (Signed)
[facility] [place] [address] [place], [place], [address] Phone: [phone]   Fax:  [phone]  Physical Therapy Treatment  Patient Details  Name: [name] MRN: [id number] Date of Birth: June 10, 1960 Referring Provider:  Pete Glatter, DC  Encounter Date: 06/11/2014      PT End of Session - 06/11/14 1138    Visit Number 8   Date for PT Re-Evaluation 07/11/14   PT Start Time 1059   PT Stop Time 1155   PT Time Calculation (min) 56 min      Past Medical History  Diagnosis Date  . CHF (congestive heart failure)     Past Surgical History  Procedure Laterality Date  . Back surgery 2007 Bilateral     There were no vitals filed for this visit.  Visit Diagnosis:  Midline low back pain without sciatica  Right shoulder pain      Subjective Assessment - 06/11/14 1104    Subjective Still painful with activity   Currently in Pain? Yes   Pain Score 2    Pain Location Back   Pain Orientation Lower   Pain Descriptors / Indicators Aching   Pain Type Chronic pain   Aggravating Factors  any activity will increase pain up to 6/10   Pain Relieving Factors easy exercises                       [facility] Adult PT Treatment/Exercise - 06/11/14 0001    Ambulation/Gait   Gait Comments walk around building required two rests due to fatigue and some pain in the low back   Lumbar Exercises: Aerobic   Stationary Bike NuStep Level 6 x 8 min   Lumbar Exercises: Machines for Strengthening   Other Lumbar Machine Exercise seated row 25#, last 25#   Other Lumbar Machine Exercise tband obliques and hip extension   Lumbar Exercises: Supine   Ab Set 20 reps;5 seconds   Other Supine Lumbar Exercises KTC, rotation with ball   Other Supine Lumbar Exercises isometric abs with ball 2 sets 10   Modalities   Modalities Electrical Stimulation;Moist Heat   Moist Heat Therapy   Number Minutes Moist Heat 15 Minutes   Moist  Heat Location Other (comment)   Electrical Stimulation   Electrical Stimulation Location low back   Electrical Stimulation Parameters IFC   Electrical Stimulation Goals Pain                PT Education - 06/11/14 1137    Education provided Yes   Education Details went over HEP, gym exercises she could do and a walking program   Person(s) Educated Patient   Methods Explanation;Demonstration   Comprehension Verbalized understanding;Returned demonstration          PT Short Term Goals - 05/25/14 1035    PT SHORT TERM GOAL #1   Title independent with initial HEP   Time 2   Period Weeks   Status Achieved           PT Long Term Goals - 06/11/14 1140    PT LONG TERM GOAL #3   Title increase ROM of the right shouler ER and IR to 70 degrees   Status Achieved               Plan - 06/11/14 1139    Clinical Impression Statement Patient reports that she feels better and has less pain with normal ADL's and at rest, however  c/o toe numbness and is concerned about this   PT Next Visit Plan Patient reports that she may return to MD as she is concerned about the toe numbness and the fact that she has had lumbar surgery in past   Consulted and Agree with Plan of Care Patient        Problem List There are no active problems to display for this patient.   Jearld LeschALBRIGHT,Talena Neira W, PT 06/11/2014, 11:41 AM  [facility] [address] [address] [place], [place], [address] Phone: [phone]   Fax:  [phone]

## 2014-06-12 ENCOUNTER — Ambulatory Visit: Payer: No Typology Code available for payment source | Admitting: Rehabilitation

## 2014-08-24 DIAGNOSIS — M5136 Other intervertebral disc degeneration, lumbar region: Secondary | ICD-10-CM | POA: Insufficient documentation

## 2014-08-24 DIAGNOSIS — M47816 Spondylosis without myelopathy or radiculopathy, lumbar region: Secondary | ICD-10-CM | POA: Insufficient documentation

## 2015-02-15 DIAGNOSIS — R0603 Acute respiratory distress: Secondary | ICD-10-CM | POA: Insufficient documentation

## 2015-02-17 DIAGNOSIS — R739 Hyperglycemia, unspecified: Secondary | ICD-10-CM | POA: Insufficient documentation

## 2015-07-08 ENCOUNTER — Emergency Department (HOSPITAL_COMMUNITY)
Admission: EM | Admit: 2015-07-08 | Discharge: 2015-07-08 | Disposition: A | Discharge status: Home / Self Care | Payer: No Typology Code available for payment source | Attending: Emergency Medicine | Admitting: Emergency Medicine

## 2015-07-08 ENCOUNTER — Encounter (HOSPITAL_COMMUNITY): Payer: Self-pay | Admitting: Emergency Medicine

## 2015-07-08 DIAGNOSIS — E119 Type 2 diabetes mellitus without complications: Secondary | ICD-10-CM | POA: Insufficient documentation

## 2015-07-08 DIAGNOSIS — I509 Heart failure, unspecified: Secondary | ICD-10-CM | POA: Insufficient documentation

## 2015-07-08 DIAGNOSIS — I10 Essential (primary) hypertension: Secondary | ICD-10-CM | POA: Insufficient documentation

## 2015-07-08 DIAGNOSIS — F17200 Nicotine dependence, unspecified, uncomplicated: Secondary | ICD-10-CM | POA: Insufficient documentation

## 2015-07-08 DIAGNOSIS — M545 Low back pain: Secondary | ICD-10-CM | POA: Insufficient documentation

## 2015-07-08 DIAGNOSIS — G8929 Other chronic pain: Secondary | ICD-10-CM | POA: Insufficient documentation

## 2015-07-08 DIAGNOSIS — M1990 Unspecified osteoarthritis, unspecified site: Secondary | ICD-10-CM | POA: Insufficient documentation

## 2015-07-08 HISTORY — DX: Type 2 diabetes mellitus without complications: E11.9

## 2015-07-08 HISTORY — DX: Disorder of thyroid, unspecified: E07.9

## 2015-07-08 HISTORY — DX: Essential (primary) hypertension: I10

## 2015-07-08 HISTORY — DX: Unspecified osteoarthritis, unspecified site: M19.90

## 2015-07-08 MED ORDER — APAP 325 MG PO TABS: 650.0000 mg | ORAL_TABLET | Freq: Four times a day (QID) | ORAL | Status: DC | PRN

## 2015-07-08 NOTE — ED Provider Notes (Signed)
CSN: [id number]     Arrival date & time 07/08/15  1037 History  By signing my name below, I, Freida Busmaniana Omoyeni, attest that this documentation has been prepared under the direction and in the presence of non-physician practitioner, Everlene FarrierWilliam Dequarius Jeffries, PA-C. Electronically Signed: Freida Busmaniana Omoyeni, Scribe. 07/08/2015. 11:17 AM.    Chief Complaint  Patient presents with  . Back Pain    The history is provided by the patient. No language interpreter was used.     HPI Comments:  [name] is a 55 y.o. female with a history of chronic back pain since back surgery in 2007, who presents to the Emergency Department complaining of 10/10 lower back pain, worse in the last 3 days. Pt has next appointment with pain management scheduled for 07/19/15. She reports she ran out of her narcotic pain medications 3 days ago. She has taking tylenol and ibuprofen without relief.   Pt denies recent injury/fall, bowel/bladder incontinence, difficulty urinating, dysuria, hematuria, frequency/urgent urination, numbness/weakness to the extremities,  abdominal pain, nausea, and vomiting. Pt also denies h/o CA, and h/o  IVDA . No alleviating factors noted.  On 06/18/15 pt received 30 day supply of Vicodin with 90 tablets; pt states she ran out 3 days ago.   Past Medical History  Diagnosis Date  . CHF (congestive heart failure) (HCC)   . Hypertension   . Diabetes mellitus without complication (HCC)   . Arthritis   . Thyroid disease    Past Surgical History  Procedure Laterality Date  . Back surgery 2007 Bilateral    No family history on file. Social History  Substance Use Topics  . Smoking status: Current Every Day Smoker  . Smokeless tobacco: None  . Alcohol Use: Yes   OB History    No data available     Review of Systems  Constitutional: Negative for fever.  Gastrointestinal: Negative for nausea, vomiting and abdominal pain.  Genitourinary: Negative for dysuria, urgency, frequency, hematuria and difficulty  urinating.  Musculoskeletal: Positive for back pain.  Skin: Negative for rash and wound.  Neurological: Negative for weakness and numbness.   Allergies  Review of patient's allergies indicates no known allergies.  Home Medications   Prior to Admission medications   Medication Sig Start Date End Date Taking? Authorizing Provider  Acetaminophen (APAP) 325 MG tablet Take 2 tablets (650 mg total) by mouth every 6 (six) hours as needed. 07/08/15   Everlene FarrierWilliam Elon Eoff, PA-C   BP 147/78 mmHg  Pulse 94  Temp(Src) 98.9 F (37.2 C) (Oral)  Resp 18  Ht 5\' 3"  (1.6 m)  Wt 113.399 kg  BMI 44.30 kg/m2  SpO2 97% Physical Exam  Constitutional: She appears well-developed and well-nourished. No distress.  Nontoxic appearing.  HENT:  Head: Normocephalic and atraumatic.  Eyes: Right eye exhibits no discharge. Left eye exhibits no discharge.  Cardiovascular: Normal rate, regular rhythm, normal heart sounds and intact distal pulses.   Pulmonary/Chest: Effort normal and breath sounds normal. No respiratory distress.  Abdominal: Soft. There is no tenderness.  Musculoskeletal: Normal range of motion. She exhibits tenderness. She exhibits no edema.  Diffuse tenderness throughout lower back without focal tenderness. No midline spine tenderness  No erythema, edema, or ecchymosis to back.  5/5 strength BLE nml gait   Neurological: She is alert. She has normal reflexes. She displays normal reflexes. Coordination normal.  Bilateral patellar DTRs intact. Normal gait.  Skin: Skin is warm and dry. No rash noted. She is not diaphoretic. No erythema. No pallor.  Psychiatric: She has a normal mood and affect. Her behavior is normal.  Nursing note and vitals reviewed.   ED Course  Procedures   DIAGNOSTIC STUDIES:  Oxygen Saturation is 97% on RA, normal by my interpretation.    COORDINATION OF CARE:  11:14 AM Discussed treatment plan with pt at bedside and pt agreed to plan.   MDM   Meds given in  ED:  Medications - No data to display  New Prescriptions   ACETAMINOPHEN (APAP) 325 MG TABLET    Take 2 tablets (650 mg total) by mouth every 6 (six) hours as needed.     Final diagnoses:  Chronic low back pain   This is a 55 y.o. female with a history of chronic back pain since back surgery in 2007, who presents to the Emergency Department complaining of 10/10 lower back pain, worse in the last 3 days. Pt has next appointment with pain management scheduled for 07/19/15. She reports she ran out of her narcotic pain medications 3 days ago. Patient with chronic back pain after running out of her pain medications early. On 06/18/15 pt received 30 day supply of Vicodin with 90 tablets; pt states she ran out 3 days ago.   No neurological deficits and normal neuro exam.  Patient is ambulatory.  No loss of bowel or bladder control.  No concern for cauda equina.  No fever, night sweats, weight loss, h/o cancer, IVDA, no recent procedure to back. No urinary symptoms suggestive of UTI.  Supportive care and return precaution discussed. Appears safe for discharge at this time. I advised her not to use ibuprofen as she has elevated creatinine. I discussed return precautions. I advised the patient to follow-up with their primary care provider this week. I advised the patient to return to the emergency department with new or worsening symptoms or new concerns. The patient verbalized understanding and agreement with plan.     I personally performed the services described in this documentation, which was scribed in my presence. The recorded information has been reviewed and is accurate.              Everlene Farrier, PA-C 07/08/15 1126  Alvira Monday, MD 07/09/15 1123

## 2015-07-08 NOTE — Discharge Instructions (Signed)
Back Pain, Adult °Back pain is very common in adults. The cause of back pain is rarely dangerous and the pain often gets better over time. The cause of your back pain may not be known. Some common causes of back pain include: °1. Strain of the muscles or ligaments supporting the spine. °2. Wear and tear (degeneration) of the spinal disks. °3. Arthritis. °4. Direct injury to the back. °For many people, back pain may return. Since back pain is rarely dangerous, most people can learn to manage this condition on their own. °HOME CARE INSTRUCTIONS °Watch your back pain for any changes. The following actions may help to lessen any discomfort you are feeling: °1. Remain active. It is stressful on your back to sit or stand in one place for long periods of time. Do not sit, drive, or stand in one place for more than 30 minutes at a time. Take short walks on even surfaces as soon as you are able. Try to increase the length of time you walk each day. °2. Exercise regularly as directed by your health care provider. Exercise helps your back heal faster. It also helps avoid future injury by keeping your muscles strong and flexible. °3. Do not stay in bed. Resting more than 1-2 days can delay your recovery. °4. Pay attention to your body when you bend and lift. The most comfortable positions are those that put less stress on your recovering back. Always use proper lifting techniques, including: °1. Bending your knees. °2. Keeping the load close to your body. °3. Avoiding twisting. °5. Find a comfortable position to sleep. Use a firm mattress and lie on your side with your knees slightly bent. If you lie on your back, put a pillow under your knees. °6. Avoid feeling anxious or stressed. Stress increases muscle tension and can worsen back pain. It is important to recognize when you are anxious or stressed and learn ways to manage it, such as with exercise. °7. Take medicines only as directed by your health care provider.  Over-the-counter medicines to reduce pain and inflammation are often the most helpful. Your health care provider may prescribe muscle relaxant drugs. These medicines help dull your pain so you can more quickly return to your normal activities and healthy exercise. °8. Apply ice to the injured area: °1. Put ice in a plastic bag. °2. Place a towel between your skin and the bag. °3. Leave the ice on for 20 minutes, 2-3 times a day for the first 2-3 days. After that, ice and heat may be alternated to reduce pain and spasms. °9. Maintain a healthy weight. Excess weight puts extra stress on your back and makes it difficult to maintain good posture. °SEEK MEDICAL CARE IF: °1. You have pain that is not relieved with rest or medicine. °2. You have increasing pain going down into the legs or buttocks. °3. You have pain that does not improve in one week. °4. You have night pain. °5. You lose weight. °6. You have a fever or chills. °SEEK IMMEDIATE MEDICAL CARE IF:  °1. You develop new bowel or bladder control problems. °2. You have unusual weakness or numbness in your arms or legs. °3. You develop nausea or vomiting. °4. You develop abdominal pain. °5. You feel faint. °  °This information is not intended to replace advice given to you by your health care provider. Make sure you discuss any questions you have with your health care provider. °  °Document Released: 02/13/2005 Document Revised: 03/06/2014 Document Reviewed: 06/17/2013 °[employer]vier Interactive Patient Education ©2016 [employer] ° °Back Exercises °The following exercises strengthen the muscles that help to support the back. They also help to keep the lower back flexible. Doing these exercises can help to prevent back pain or lessen existing pain. °If you have back pain or discomfort, try doing these exercises 2-3 times each day or as told by your health care provider. When the pain goes away, do them once each day, but increase the number of times that you repeat the  steps for each exercise (do more repetitions). If you do not have back pain or discomfort, do these exercises once each day or as told by your health care provider. °EXERCISES °Single Knee to Chest °Repeat these steps 3-5 times for each leg: °5. Lie on your back on a firm bed or the floor with your legs extended. °6. Bring one knee to your chest. Your other leg should stay extended and in contact with the floor. °7. Hold your knee in place by grabbing your knee or thigh. °8. Pull on your knee until you feel a gentle stretch in your lower back. °9. Hold the stretch for 10-30 seconds. °10. Slowly release and straighten your leg. °Pelvic Tilt °Repeat these steps 5-10 times: °10. Lie on your back on a firm bed or the floor with your legs extended. °11. Bend your knees so they are pointing toward the ceiling and your feet are flat on the floor. °12. Tighten your lower abdominal muscles to press your lower back against the floor. This motion will tilt your pelvis so your tailbone points up toward the ceiling instead of pointing to your feet or the floor. °13. With gentle tension and even breathing, hold this position for 5-10 seconds. °Cat-Cow °Repeat these steps until your lower back becomes more flexible: °7. Get into a hands-and-knees position on a firm surface. Keep your hands under your shoulders, and keep your knees under your hips. You may place padding under your knees for comfort. °8. Let your head hang down, and point your tailbone toward the floor so your lower back becomes rounded like the back of a cat. °9. Hold this position for 5 seconds. °10. Slowly lift your head and point your tailbone up toward the ceiling so your back forms a sagging arch like the back of a cow. °11. Hold this position for 5 seconds. °Press-Ups °Repeat these steps 5-10 times: °6. Lie on your abdomen (face-down) on the floor. °7. Place your palms near your head, about shoulder-width apart. °8. While you keep your back as relaxed as  possible and keep your hips on the floor, slowly straighten your arms to raise the top half of your body and lift your shoulders. Do not use your back muscles to raise your upper torso. You may adjust the placement of your hands to make yourself more comfortable. °9. Hold this position for 5 seconds while you keep your back relaxed. °10. Slowly return to lying flat on the floor. °Bridges °Repeat these steps 10 times: °1. Lie on your back on a firm surface. °2. Bend your knees so they are pointing toward the ceiling and your feet are flat on the floor. °3. Tighten your buttocks muscles and lift your buttocks off of the floor until your waist is at almost the same height as your knees. You should feel the muscles working in your buttocks and the back of your thighs. If you do not feel these muscles, slide your feet 1-2 inches farther away from your buttocks. °4. Hold this   position for 3-5 seconds. °5. Slowly lower your hips to the starting position, and allow your buttocks muscles to relax completely. °If this exercise is too easy, try doing it with your arms crossed over your chest. °Abdominal Crunches °Repeat these steps 5-10 times: °1. Lie on your back on a firm bed or the floor with your legs extended. °2. Bend your knees so they are pointing toward the ceiling and your feet are flat on the floor. °3. Cross your arms over your chest. °4. Tip your chin slightly toward your chest without bending your neck. °5. Tighten your abdominal muscles and slowly raise your trunk (torso) high enough to lift your shoulder blades a tiny bit off of the floor. Avoid raising your torso higher than that, because it can put too much stress on your low back and it does not help to strengthen your abdominal muscles. °6. Slowly return to your starting position. °Back Lifts °Repeat these steps 5-10 times: °1. Lie on your abdomen (face-down) with your arms at your sides, and rest your forehead on the floor. °2. Tighten the muscles in your  legs and your buttocks. °3. Slowly lift your chest off of the floor while you keep your hips pressed to the floor. Keep the back of your head in line with the curve in your back. Your eyes should be looking at the floor. °4. Hold this position for 3-5 seconds. °5. Slowly return to your starting position. °SEEK MEDICAL CARE IF: °· Your back pain or discomfort gets much worse when you do an exercise. °· Your back pain or discomfort does not lessen within 2 hours after you exercise. °If you have any of these problems, stop doing these exercises right away. Do not do them again unless your health care provider says that you can. °SEEK IMMEDIATE MEDICAL CARE IF: °· You develop sudden, severe back pain. If this happens, stop doing the exercises right away. Do not do them again unless your health care provider says that you can. °  °This information is not intended to replace advice given to you by your health care provider. Make sure you discuss any questions you have with your health care provider. °  °Document Released: 03/23/2004 Document Revised: 11/04/2014 Document Reviewed: 04/09/2014 °Elsevier Interactive Patient Education ©2016 [employer] ° °
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
## 2016-06-13 ENCOUNTER — Ambulatory Visit (INDEPENDENT_AMBULATORY_CARE_PROVIDER_SITE_OTHER): Payer: Self-pay | Admitting: Internal Medicine

## 2016-06-13 VITALS — BP 138/82 | HR 62 | Resp 18 | Ht 63.0 in | Wt 247.0 lb

## 2016-06-13 DIAGNOSIS — J441 Chronic obstructive pulmonary disease with (acute) exacerbation: Secondary | ICD-10-CM

## 2016-06-13 DIAGNOSIS — J3089 Other allergic rhinitis: Secondary | ICD-10-CM

## 2016-07-14 ENCOUNTER — Ambulatory Visit: Payer: Self-pay | Admitting: Internal Medicine

## 2016-07-20 ENCOUNTER — Emergency Department (HOSPITAL_COMMUNITY): Payer: Medicaid Other

## 2016-07-20 ENCOUNTER — Encounter (HOSPITAL_COMMUNITY): Payer: Self-pay

## 2016-07-20 ENCOUNTER — Emergency Department (HOSPITAL_COMMUNITY)
Admission: EM | Admit: 2016-07-20 | Discharge: 2016-07-20 | Disposition: A | Discharge status: Home / Self Care | Payer: Medicaid Other | Attending: Emergency Medicine | Admitting: Emergency Medicine

## 2016-07-20 DIAGNOSIS — Z79899 Other long term (current) drug therapy: Secondary | ICD-10-CM | POA: Diagnosis not present

## 2016-07-20 DIAGNOSIS — M5441 Lumbago with sciatica, right side: Secondary | ICD-10-CM | POA: Insufficient documentation

## 2016-07-20 DIAGNOSIS — I110 Hypertensive heart disease with heart failure: Secondary | ICD-10-CM | POA: Diagnosis not present

## 2016-07-20 DIAGNOSIS — R05 Cough: Secondary | ICD-10-CM | POA: Insufficient documentation

## 2016-07-20 DIAGNOSIS — F17200 Nicotine dependence, unspecified, uncomplicated: Secondary | ICD-10-CM | POA: Insufficient documentation

## 2016-07-20 DIAGNOSIS — G8929 Other chronic pain: Secondary | ICD-10-CM

## 2016-07-20 DIAGNOSIS — G894 Chronic pain syndrome: Secondary | ICD-10-CM | POA: Diagnosis not present

## 2016-07-20 DIAGNOSIS — J449 Chronic obstructive pulmonary disease, unspecified: Secondary | ICD-10-CM | POA: Insufficient documentation

## 2016-07-20 DIAGNOSIS — M5442 Lumbago with sciatica, left side: Secondary | ICD-10-CM | POA: Insufficient documentation

## 2016-07-20 DIAGNOSIS — E119 Type 2 diabetes mellitus without complications: Secondary | ICD-10-CM | POA: Insufficient documentation

## 2016-07-20 DIAGNOSIS — R053 Chronic cough: Secondary | ICD-10-CM

## 2016-07-20 DIAGNOSIS — I509 Heart failure, unspecified: Secondary | ICD-10-CM | POA: Diagnosis not present

## 2016-07-20 LAB — COMPREHENSIVE METABOLIC PANEL
ALT: 25 U/L (ref 14–54)
AST: 25 U/L (ref 15–41)
Albumin: 3.5 g/dL (ref 3.5–5.0)
Alkaline Phosphatase: 92 U/L (ref 38–126)
Anion gap: 8 (ref 5–15)
BILIRUBIN TOTAL: 0.7 mg/dL (ref 0.3–1.2)
BUN: 11 mg/dL (ref 6–20)
CO2: 29 mmol/L (ref 22–32)
Calcium: 9.1 mg/dL (ref 8.9–10.3)
Chloride: 104 mmol/L (ref 101–111)
Creatinine, Ser: 1.18 mg/dL — ABNORMAL HIGH (ref 0.44–1.00)
GFR, EST AFRICAN AMERICAN: 59 mL/min — AB (ref >60)
GFR, EST NON AFRICAN AMERICAN: 51 mL/min — AB (ref >60)
Glucose, Bld: 133 mg/dL — ABNORMAL HIGH (ref 65–99)
POTASSIUM: 3.4 mmol/L — AB (ref 3.5–5.1)
Sodium: 141 mmol/L (ref 135–145)
TOTAL PROTEIN: 6.4 g/dL — AB (ref 6.5–8.1)

## 2016-07-20 LAB — CBC WITH DIFFERENTIAL/PLATELET
BASOS ABS: 0 10*3/uL (ref 0.0–0.1)
Basophils Relative: 0 %
EOS PCT: 2 %
Eosinophils Absolute: 0.2 10*3/uL (ref 0.0–0.7)
HEMATOCRIT: 41.3 % (ref 36.0–46.0)
Hemoglobin: 13.4 g/dL (ref 12.0–15.0)
LYMPHS ABS: 2.2 10*3/uL (ref 0.7–4.0)
LYMPHS PCT: 25 %
MCH: 30.5 pg (ref 26.0–34.0)
MCHC: 32.4 g/dL (ref 30.0–36.0)
MCV: 93.9 fL (ref 78.0–100.0)
MONO ABS: 0.3 10*3/uL (ref 0.1–1.0)
MONOS PCT: 4 %
NEUTROS ABS: 6.1 10*3/uL (ref 1.7–7.7)
Neutrophils Relative %: 69 %
PLATELETS: 292 10*3/uL (ref 150–400)
RBC: 4.4 MIL/uL (ref 3.87–5.11)
RDW: 15.5 % (ref 11.5–15.5)
WBC: 8.8 10*3/uL (ref 4.0–10.5)

## 2016-07-20 LAB — I-STAT TROPONIN, ED: TROPONIN I, POC: 0.01 ng/mL (ref 0.00–0.08)

## 2016-07-20 MED ORDER — KETOROLAC TROMETHAMINE 60 MG/2ML IM SOLN
15.0000 mg | Freq: Once | INTRAMUSCULAR | Status: AC
  Administered 2016-07-20: 15 mg via INTRAMUSCULAR
  Filled 2016-07-20: qty 2

## 2016-07-20 MED ORDER — AZITHROMYCIN 250 MG PO TABS: 250.0000 mg | ORAL_TABLET | Freq: Every day | ORAL | 0 refills | Status: DC

## 2016-07-20 MED ORDER — DIAZEPAM 2 MG PO TABS
2.0000 mg | ORAL_TABLET | Freq: Once | ORAL | Status: AC
  Administered 2016-07-20: 2 mg via ORAL
  Filled 2016-07-20: qty 1

## 2016-07-20 MED ORDER — ALBUTEROL SULFATE (2.5 MG/3ML) 0.083% IN NEBU
INHALATION_SOLUTION | RESPIRATORY_TRACT | Status: AC
  Filled 2016-07-20: qty 3

## 2016-07-20 MED ORDER — PREDNISONE 20 MG PO TABS: ORAL_TABLET | ORAL | 0 refills | Status: DC

## 2016-07-20 MED ORDER — ACETAMINOPHEN 500 MG PO TABS
1000.0000 mg | ORAL_TABLET | Freq: Once | ORAL | Status: AC
  Administered 2016-07-20: 1000 mg via ORAL
  Filled 2016-07-20: qty 2

## 2016-07-20 MED ORDER — PREDNISONE 20 MG PO TABS
60.0000 mg | ORAL_TABLET | Freq: Once | ORAL | Status: AC
  Administered 2016-07-20: 60 mg via ORAL
  Filled 2016-07-20: qty 3

## 2016-07-20 MED ORDER — BENZONATATE 100 MG PO CAPS: 100.0000 mg | ORAL_CAPSULE | Freq: Three times a day (TID) | ORAL | 0 refills | Status: DC

## 2016-07-20 MED ORDER — ALBUTEROL SULFATE (2.5 MG/3ML) 0.083% IN NEBU
5.0000 mg | INHALATION_SOLUTION | Freq: Once | RESPIRATORY_TRACT | Status: AC
  Administered 2016-07-20: 5 mg via RESPIRATORY_TRACT

## 2016-07-20 MED ORDER — OXYCODONE HCL 5 MG PO TABS
5.0000 mg | ORAL_TABLET | Freq: Once | ORAL | Status: AC
  Administered 2016-07-20: 5 mg via ORAL
  Filled 2016-07-20: qty 1

## 2016-07-20 NOTE — ED Triage Notes (Addendum)
Bilateral rib pain that radiates to back. PT was diagnosed with bronchitis 3 weeks ago and has had sob and pain since then. Denies chest pain or nausea. Pt still endorses cough from bronchitis. Breath sounds clear per ems.  spo2 97%ra cbg 133 bp 132/80 Hr 84

## 2016-07-20 NOTE — ED Provider Notes (Signed)
[facility]-EMERGENCY DEPT Provider Note   CSN: [id number] Arrival date & time: 07/20/16  1340   By signing my name below, I, [name], attest that this documentation has been prepared under the direction and in the presence of No att. providers found. Electronically Signed: [name], Scribe. 07/20/16. 3:28 PM.   History   Chief Complaint Chief Complaint  Patient presents with  . Cough  . Shortness of Breath   The history is provided by the patient. No language interpreter was used.  Cough  This is a chronic problem. The current episode started more than 1 week ago. The problem occurs every few minutes. There has been no fever. Associated symptoms include chest pain, rhinorrhea, myalgias and shortness of breath. Pertinent negatives include no chills, no headaches, no wheezing and no eye redness.  Shortness of Breath  This is a chronic problem. The current episode started more than 1 week ago. Associated symptoms include rhinorrhea, cough, chest pain and leg pain. Pertinent negatives include no fever, no headaches, no wheezing and no vomiting. The treatment provided no relief.    HPI Comments: [name] is a 56 y.o. female with a PSHx of back surgery who presents to the Emergency Department complaining of chronic, constant, gradually worsening cough and SOB since 3 weeks. She has associated chest pain when she coughs, lower back pain that radiates to her legs, and numbness in her fingertips. She states she takes tylenol with no pain relief. She denies changes to bowel/bladder function beyond baseline.   Past Medical History:  Diagnosis Date  . Arthritis   . CHF (congestive heart failure) (HCC)   . COPD (chronic obstructive pulmonary disease) (HCC)   . Diabetes mellitus without complication (HCC)   . Hypertension   . Thyroid disease     There are no active problems to display for this patient.   Past Surgical History:  Procedure Laterality Date  . back surgery 2007  Bilateral     OB History    No data available       Home Medications    Prior to Admission medications   Medication Sig Start Date End Date Taking? Authorizing Provider  Acetaminophen (APAP) 325 MG tablet Take 2 tablets (650 mg total) by mouth every 6 (six) hours as needed. 07/08/15   Everlene Farrier, PA-C  azithromycin (ZITHROMAX) 250 MG tablet Take 1 tablet (250 mg total) by mouth daily. Take first 2 tablets together, then 1 every day until finished. 07/20/16   Melene Plan, DO  benzonatate (TESSALON) 100 MG capsule Take 1 capsule (100 mg total) by mouth every 8 (eight) hours. 07/20/16   Melene Plan, DO  predniSONE (DELTASONE) 20 MG tablet 2 tabs po daily x 4 days 07/20/16   Melene Plan, DO    Family History No family history on file.  Social History Social History  Substance Use Topics  . Smoking status: Current Every Day Smoker  . Smokeless tobacco: Never Used  . Alcohol use Yes     Allergies   Patient has no known allergies.   Review of Systems Review of Systems  Constitutional: Negative for chills and fever.  HENT: Positive for rhinorrhea. Negative for congestion.   Eyes: Negative for redness and visual disturbance.  Respiratory: Positive for cough and shortness of breath. Negative for wheezing.   Cardiovascular: Positive for chest pain. Negative for palpitations.  Gastrointestinal: Negative for nausea and vomiting.  Genitourinary: Negative for dysuria and urgency.  Musculoskeletal: Positive for myalgias. Negative for arthralgias.  Skin: Negative for pallor and wound.  Neurological: Positive for numbness. Negative for dizziness and headaches.     Physical Exam Updated Vital Signs BP (!) 145/76 (BP Location: Left Arm)   Pulse 85   Resp 18   Ht 5\' 4"  (1.626 m)   Wt 113.4 kg (250 lb)   SpO2 99%   BMI 42.91 kg/m   Physical Exam  Constitutional: She is oriented to person, place, and time. She appears well-developed and well-nourished. No distress.  HENT:    Head: Normocephalic and atraumatic.  Eyes: EOM are normal. Pupils are equal, round, and reactive to light.  Neck: Normal range of motion. Neck supple.  Cardiovascular: Normal rate and regular rhythm.  Exam reveals no gallop and no friction rub.   No murmur heard. Pulmonary/Chest: Effort normal. She has no wheezes. She has no rales.   Clear lungs, swollen turbinates, rhinorrhea  Abdominal: Soft. She exhibits no distension. There is no tenderness.  Musculoskeletal: She exhibits no edema or tenderness.  Left SI joint  Intact pulse motor and sensation to right lower extremity She has subjective no sensation to plantar surface of left foot and the left lateral dorsal aspect of the left foot This is a chronic issue for her and she has had it multiple times in the past No clonus, reflexes 2+ bilaterally Negative bobinski's bilaterally  Neurological: She is alert and oriented to person, place, and time.  Skin: Skin is warm and dry. She is not diaphoretic.  Psychiatric: She has a normal mood and affect. Her behavior is normal.  Nursing note and vitals reviewed.    ED Treatments / Results  DIAGNOSTIC STUDIES: Oxygen Saturation is 100% on RA, normal by my interpretation.    COORDINATION OF CARE: 3:26 PM Discussed treatment plan with pt at bedside and pt agreed to plan. Labs (all labs ordered are listed, but only abnormal results are displayed) Labs Reviewed  COMPREHENSIVE METABOLIC PANEL - Abnormal; Notable for the following:       Result Value   Potassium 3.4 (*)    Glucose, Bld 133 (*)    Creatinine, Ser 1.18 (*)    Total Protein 6.4 (*)    GFR calc non Af Amer 51 (*)    GFR calc Af Amer 59 (*)    All other components within normal limits  CBC WITH DIFFERENTIAL/PLATELET  [facility] SensorI-STAT TROPOININ, ED    EKG  EKG Interpretation  Date/Time:  Thursday Jul 20 2016 13:45:04 EDT Ventricular Rate:  72 PR Interval:  150 QRS Duration: 80 QT Interval:  366 QTC Calculation: 400 R  Axis:   38 Text Interpretation:  Normal sinus rhythm Possible Left atrial enlargement biphasic t waves in the inferior and lateral leads new since 08 Otherwise no significant change Confirmed by Adela LankFLOYD MD, Reuel BoomANIEL [phone]([id number]) on 07/20/2016 3:02:01 PM       Radiology Dg Chest 2 View  Result Date: 07/20/2016 CLINICAL DATA:  56 year old female with recent bronchitis. Bilateral rib pain radiating to the back. Continued cough. Smoker. EXAM: CHEST  2 VIEW COMPARISON:  Chest radiographs 02/17/2015 and earlier. FINDINGS: Improved lung volumes today. Mediastinal contours remain normal. Visualized tracheal air column is within normal limits. No pneumothorax, pleural effusion or consolidation. Mild basilar predominant increased pulmonary interstitial markings appear chronic and stable. No acute pulmonary opacity identified. No acute displaced rib fracture identified. No acute osseous abnormality identified. Calcified aortic atherosclerosis re - identified in the abdomen. Negative visible bowel gas pattern. IMPRESSION: 1.  No acute cardiopulmonary abnormality.  2.  Calcified aortic atherosclerosis. Electronically Signed   By: Odessa Fleming M.D.   On: 07/20/2016 15:13    Procedures Procedures (including critical care time)  Medications Ordered in ED Medications  albuterol (PROVENTIL) (2.5 MG/3ML) 0.083% nebulizer solution (not administered)  albuterol (PROVENTIL) (2.5 MG/3ML) 0.083% nebulizer solution (not administered)  albuterol (PROVENTIL) (2.5 MG/3ML) 0.083% nebulizer solution 5 mg (5 mg Nebulization Given 07/20/16 1357)  acetaminophen (TYLENOL) tablet 1,000 mg (1,000 mg Oral Given 07/20/16 1535)  ketorolac (TORADOL) injection 15 mg (15 mg Intramuscular Given 07/20/16 1537)  oxyCODONE (Oxy IR/ROXICODONE) immediate release tablet 5 mg (5 mg Oral Given 07/20/16 1535)  diazepam (VALIUM) tablet 2 mg (2 mg Oral Given 07/20/16 1535)  predniSONE (DELTASONE) tablet 60 mg (60 mg Oral Given 07/20/16 1535)     Initial  Impression / Assessment and Plan / ED Course  I have reviewed the triage vital signs and the nursing notes.  Pertinent labs & imaging results that were available during my care of the patient were reviewed by me and considered in my medical decision making (see chart for details).     56 yo F With a chief complaint of a cough. Going on for the past 3 months. Patient feels it is worsened her chronic back pain. Complaining of bilateral lower back pain that radiates down both legs. Is feeling some numbness to the left lower extremity that she says that off and on chronically. Denies any changes to bowel or bladder. Denies any recent procedures or fevers. If the patient's symptoms seem conical treat her pain acutely here and have her follow with her spinal surgeon. Patient with this chronic cough some concern for pertussis will cover with antibiotics. She also had some improvement with bronchodilators will give a burst of steroids.  4:17 PM:  I have discussed the diagnosis/risks/treatment options with the patient and believe the pt to be eligible for discharge home to follow-up with PCP. We also discussed returning to the ED immediately if new or worsening sx occur. We discussed the sx which are most concerning (e.g., sudden worsening pain, fever, inability to tolerate by mouth) that necessitate immediate return. Medications administered to the patient during their visit and any new prescriptions provided to the patient are listed below.  Medications given during this visit Medications  albuterol (PROVENTIL) (2.5 MG/3ML) 0.083% nebulizer solution (not administered)  albuterol (PROVENTIL) (2.5 MG/3ML) 0.083% nebulizer solution (not administered)  albuterol (PROVENTIL) (2.5 MG/3ML) 0.083% nebulizer solution 5 mg (5 mg Nebulization Given 07/20/16 1357)  acetaminophen (TYLENOL) tablet 1,000 mg (1,000 mg Oral Given 07/20/16 1535)  ketorolac (TORADOL) injection 15 mg (15 mg Intramuscular Given 07/20/16 1537)   oxyCODONE (Oxy IR/ROXICODONE) immediate release tablet 5 mg (5 mg Oral Given 07/20/16 1535)  diazepam (VALIUM) tablet 2 mg (2 mg Oral Given 07/20/16 1535)  predniSONE (DELTASONE) tablet 60 mg (60 mg Oral Given 07/20/16 1535)     The patient appears reasonably screen and/or stabilized for discharge and I doubt any other medical condition or other [facility] requiring further screening, evaluation, or treatment in the ED at this time prior to discharge.    Final Clinical Impressions(s) / ED Diagnoses   Final diagnoses:  Chronic cough  Chronic bilateral low back pain with bilateral sciatica    New Prescriptions Discharge Medication List as of 07/20/2016  3:30 PM    START taking these medications   Details  azithromycin (ZITHROMAX) 250 MG tablet Take 1 tablet (250 mg total) by mouth daily. Take first 2 tablets  together, then 1 every day until finished., Starting Thu 07/20/2016, Print    benzonatate (TESSALON) 100 MG capsule Take 1 capsule (100 mg total) by mouth every 8 (eight) hours., Starting Thu 07/20/2016, Print    predniSONE (DELTASONE) 20 MG tablet 2 tabs po daily x 4 days, Print       I personally performed the services described in this documentation, which was scribed in my presence. The recorded information has been reviewed and is accurate.     Melene Plan, DO 07/20/16 [phone]

## 2016-07-20 NOTE — ED Notes (Signed)
ED Provider at bedside. 

## 2016-07-20 NOTE — ED Notes (Signed)
Pt. returned from XR. 

## 2016-07-20 NOTE — Discharge Instructions (Signed)
Take 4 over the counter ibuprofen tablets 3 times a day or 2 over-the-counter naproxen tablets twice a day for pain. Also take tylenol 1000mg (2 extra strength) four times a day.   Only take the ibuprofen or naproxen if your family physician feels that it's safe for you to take.

## 2016-07-24 ENCOUNTER — Encounter: Payer: Self-pay | Admitting: Internal Medicine

## 2016-07-24 MED ORDER — CETIRIZINE HCL 10 MG PO TABS: 10.0000 mg | ORAL_TABLET | Freq: Every day | ORAL | 11 refills | Status: DC
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
## 2016-08-11 ENCOUNTER — Ambulatory Visit (INDEPENDENT_AMBULATORY_CARE_PROVIDER_SITE_OTHER): Payer: Self-pay | Admitting: Internal Medicine

## 2016-08-11 DIAGNOSIS — E079 Disorder of thyroid, unspecified: Secondary | ICD-10-CM

## 2016-08-11 DIAGNOSIS — J45909 Unspecified asthma, uncomplicated: Secondary | ICD-10-CM

## 2016-08-11 DIAGNOSIS — J449 Chronic obstructive pulmonary disease, unspecified: Secondary | ICD-10-CM

## 2016-08-11 DIAGNOSIS — M5416 Radiculopathy, lumbar region: Secondary | ICD-10-CM

## 2016-08-11 DIAGNOSIS — E119 Type 2 diabetes mellitus without complications: Secondary | ICD-10-CM

## 2016-08-11 DIAGNOSIS — J3089 Other allergic rhinitis: Secondary | ICD-10-CM
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
## 2016-08-11 NOTE — Patient Instructions (Addendum)
Tobacco Cessation:   1800QUITNOW or [phone], the former for support and possibly free nicotine patches/gum and support; the latter for [facility] Smoking cessation class. Get rid of all smoking supplies:  Cigarettes, lighters, ashtrays--no stashes just in case at home if you are serious.For nicotine patches:  Stop smoking anything the day you start the first patch Start with 21 mg patch and reapply new to different area of skin every 24 hours for 30 days. Then 14 mg patch changed every 24 hours for 14 days. Then 7 mg patch changed every 24 hours for 14 days.   Call Wednesday if you have not heard from me.  I will try to get you set up with Flonase or similar and Cyclobenzaprine for back pain through [facility]--I have that message into them. Go pick up Flovent and [facility] from [facility]

## 2016-08-14 ENCOUNTER — Encounter: Payer: Self-pay | Admitting: Internal Medicine

## 2016-08-14 DIAGNOSIS — J3089 Other allergic rhinitis: Secondary | ICD-10-CM

## 2016-08-14 DIAGNOSIS — I10 Essential (primary) hypertension: Secondary | ICD-10-CM | POA: Insufficient documentation

## 2016-08-14 DIAGNOSIS — J45909 Unspecified asthma, uncomplicated: Secondary | ICD-10-CM | POA: Insufficient documentation

## 2016-08-14 DIAGNOSIS — M5416 Radiculopathy, lumbar region: Secondary | ICD-10-CM | POA: Insufficient documentation

## 2016-08-14 DIAGNOSIS — E119 Type 2 diabetes mellitus without complications: Secondary | ICD-10-CM | POA: Insufficient documentation

## 2016-08-14 DIAGNOSIS — E039 Hypothyroidism, unspecified: Secondary | ICD-10-CM | POA: Insufficient documentation

## 2016-08-14 DIAGNOSIS — J449 Chronic obstructive pulmonary disease, unspecified: Secondary | ICD-10-CM | POA: Insufficient documentation

## 2016-08-14 DIAGNOSIS — M1990 Unspecified osteoarthritis, unspecified site: Secondary | ICD-10-CM | POA: Insufficient documentation

## 2016-08-14 DIAGNOSIS — I509 Heart failure, unspecified: Secondary | ICD-10-CM | POA: Insufficient documentation

## 2016-08-14 HISTORY — DX: Other allergic rhinitis: J30.89

## 2016-08-14 MED ORDER — CETIRIZINE HCL 10 MG PO TABS: 10.0000 mg | ORAL_TABLET | Freq: Every day | ORAL | 3 refills | Status: DC

## 2016-08-14 MED ORDER — ALBUTEROL SULFATE HFA 108 (90 BASE) MCG/ACT IN AERS: 2.0000 | INHALATION_SPRAY | Freq: Four times a day (QID) | RESPIRATORY_TRACT | 1 refills | Status: DC | PRN

## 2016-08-14 MED ORDER — CYCLOBENZAPRINE HCL 10 MG PO TABS: ORAL_TABLET | ORAL | 3 refills | Status: DC

## 2016-08-14 MED ORDER — MOMETASONE FUROATE 220 MCG/INH IN AEPB: 2.0000 | INHALATION_SPRAY | Freq: Every day | RESPIRATORY_TRACT | 3 refills | Status: DC

## 2016-08-14 MED ORDER — MOMETASONE FUROATE 50 MCG/ACT NA SUSP: 2.0000 | Freq: Every day | NASAL | 3 refills | Status: DC

## 2016-08-15 ENCOUNTER — Telehealth: Payer: Self-pay | Admitting: Internal Medicine

## 2016-08-17 NOTE — Telephone Encounter (Signed)
Spoke with patient. Patient verbalized understanding.

## 2016-08-22 ENCOUNTER — Ambulatory Visit (INDEPENDENT_AMBULATORY_CARE_PROVIDER_SITE_OTHER): Payer: Self-pay | Admitting: Internal Medicine

## 2016-08-22 ENCOUNTER — Encounter: Payer: Self-pay | Admitting: Internal Medicine

## 2016-08-22 ENCOUNTER — Encounter: Payer: Self-pay | Admitting: Family Medicine

## 2016-08-22 VITALS — BP 130/78 | HR 76 | Resp 14 | Ht 63.0 in | Wt 265.0 lb

## 2016-08-22 DIAGNOSIS — M25562 Pain in left knee: Secondary | ICD-10-CM | POA: Insufficient documentation

## 2016-08-22 DIAGNOSIS — E119 Type 2 diabetes mellitus without complications: Secondary | ICD-10-CM

## 2016-08-22 DIAGNOSIS — M25561 Pain in right knee: Secondary | ICD-10-CM

## 2016-08-22 LAB — GLUCOSE, POCT (MANUAL RESULT ENTRY): POC Glucose: 113 mg/dl — AB (ref 70–99)

## 2016-08-22 NOTE — Progress Notes (Signed)
Subjective  Patient is presenting with the following illnesses  Right Knee Pain Hurting more over the last month although on and off for about a year.  No injury.  Tylenol does not help much.  Narcotic pain medications have helped some in the past.  Gabapentin did not help when took for her back.  No soft tissue swelling or redness or rashes or other joint pain except for her back.   Never had an xray.    PMH Creatinine of 1.2 last year. High of 1.7 in past  Chief Complaint noted Review of Symptoms - see HPI PMH - Smoking status noted.     Objective Vital Signs reviewed Knees - no evident soft tissue swelling or effusion.  Can almost fully extend R knee but with pain.  Diffuse mild tenderness without focality. Limited exam due to pain but no laxity or instability. Normal skin.  R hip and ankle range of motion and strength normal without pain.  Can ambulate but uses a cane      Assessments/Plans  R knee pain - seems most consistent with  DJD.  No signs of systemic inflammatory condition or internal derangement of infection.  Will get xray to confirm and rule out focal lesions  Given history of mild renal insuff will not use full dose nsaids but try combination of different nonnarcotic analgesics - see after visit summary

## 2016-08-22 NOTE — Patient Instructions (Signed)
Good to see you today!  Thanks for coming in  For your knee Use combination of things for the pain - often the work better together than separately  Aspercreme - OTC use 2-3 x a day rub on  Tylenol 500 mg can take 2 tabs up to 3 x a day  Ibuprofen (motrin ) can take up to 1 tab every 12 hours (do not take every day) We will get an xray to see if any thing other than arthritis is going on   Losing weight will help with your pain -even 5 lbs - cut out sweet sugar drinks

## 2016-09-20 ENCOUNTER — Other Ambulatory Visit: Payer: Self-pay

## 2016-09-25 ENCOUNTER — Other Ambulatory Visit: Payer: Self-pay

## 2016-10-09 ENCOUNTER — Emergency Department (HOSPITAL_COMMUNITY)
Admission: EM | Admit: 2016-10-09 | Discharge: 2016-10-09 | Disposition: A | Discharge status: Home / Self Care | Payer: Medicaid Other | Attending: Emergency Medicine | Admitting: Emergency Medicine

## 2016-10-09 ENCOUNTER — Encounter (HOSPITAL_COMMUNITY): Payer: Self-pay

## 2016-10-09 DIAGNOSIS — G8929 Other chronic pain: Secondary | ICD-10-CM | POA: Diagnosis not present

## 2016-10-09 DIAGNOSIS — J449 Chronic obstructive pulmonary disease, unspecified: Secondary | ICD-10-CM | POA: Insufficient documentation

## 2016-10-09 DIAGNOSIS — E119 Type 2 diabetes mellitus without complications: Secondary | ICD-10-CM | POA: Insufficient documentation

## 2016-10-09 DIAGNOSIS — J45909 Unspecified asthma, uncomplicated: Secondary | ICD-10-CM | POA: Diagnosis not present

## 2016-10-09 DIAGNOSIS — Z79899 Other long term (current) drug therapy: Secondary | ICD-10-CM | POA: Insufficient documentation

## 2016-10-09 DIAGNOSIS — I110 Hypertensive heart disease with heart failure: Secondary | ICD-10-CM | POA: Insufficient documentation

## 2016-10-09 DIAGNOSIS — M545 Low back pain: Secondary | ICD-10-CM | POA: Diagnosis not present

## 2016-10-09 DIAGNOSIS — I509 Heart failure, unspecified: Secondary | ICD-10-CM | POA: Insufficient documentation

## 2016-10-09 DIAGNOSIS — M25561 Pain in right knee: Secondary | ICD-10-CM | POA: Diagnosis not present

## 2016-10-09 DIAGNOSIS — F17210 Nicotine dependence, cigarettes, uncomplicated: Secondary | ICD-10-CM | POA: Insufficient documentation

## 2016-10-09 MED ORDER — OXYCODONE-ACETAMINOPHEN 5-325 MG PO TABS
1.0000 | ORAL_TABLET | Freq: Once | ORAL | Status: AC
  Administered 2016-10-09: 1 via ORAL
  Filled 2016-10-09: qty 1

## 2016-10-09 MED ORDER — TRAMADOL HCL 50 MG PO TABS: 50.0000 mg | ORAL_TABLET | Freq: Four times a day (QID) | ORAL | 0 refills | Status: DC | PRN

## 2016-10-09 MED ORDER — DICLOFENAC SODIUM 1 % TD GEL: TRANSDERMAL | 0 refills | Status: DC

## 2016-10-09 NOTE — ED Notes (Signed)
Patient Alert and oriented X4. Stable and ambulatory. Patient verbalized understanding of the discharge instructions.  Patient belongings were taken by the patient.  

## 2016-10-09 NOTE — Discharge Instructions (Signed)
It was my pleasure taking care of you today!   You need to follow up with both the orthopedist and your spine doctor.   Return to ER for new or worsening symptoms, any additional concerns.

## 2016-10-09 NOTE — ED Notes (Signed)
PA-C at bedside 
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
## 2016-10-11 ENCOUNTER — Ambulatory Visit: Payer: Self-pay | Admitting: Internal Medicine

## 2016-10-11 MED FILL — traMADol HCL 50 MG TABS: 50 | 2 days supply | Qty: 5 | Fill #0

## 2016-10-16 ENCOUNTER — Ambulatory Visit: Payer: Self-pay | Admitting: Internal Medicine

## 2016-10-31 ENCOUNTER — Emergency Department (HOSPITAL_COMMUNITY)
Admission: EM | Admit: 2016-10-31 | Discharge: 2016-10-31 | Disposition: A | Discharge status: Home / Self Care | Payer: Medicaid Other | Attending: Emergency Medicine | Admitting: Emergency Medicine

## 2016-10-31 ENCOUNTER — Encounter (HOSPITAL_COMMUNITY): Payer: Self-pay | Admitting: Emergency Medicine

## 2016-10-31 DIAGNOSIS — E119 Type 2 diabetes mellitus without complications: Secondary | ICD-10-CM | POA: Diagnosis not present

## 2016-10-31 DIAGNOSIS — G8929 Other chronic pain: Secondary | ICD-10-CM | POA: Diagnosis not present

## 2016-10-31 DIAGNOSIS — M545 Low back pain: Secondary | ICD-10-CM | POA: Diagnosis not present

## 2016-10-31 DIAGNOSIS — K644 Residual hemorrhoidal skin tags: Secondary | ICD-10-CM | POA: Insufficient documentation

## 2016-10-31 DIAGNOSIS — Z79899 Other long term (current) drug therapy: Secondary | ICD-10-CM | POA: Diagnosis not present

## 2016-10-31 DIAGNOSIS — J45909 Unspecified asthma, uncomplicated: Secondary | ICD-10-CM | POA: Diagnosis not present

## 2016-10-31 DIAGNOSIS — J449 Chronic obstructive pulmonary disease, unspecified: Secondary | ICD-10-CM | POA: Diagnosis not present

## 2016-10-31 DIAGNOSIS — I110 Hypertensive heart disease with heart failure: Secondary | ICD-10-CM | POA: Diagnosis not present

## 2016-10-31 DIAGNOSIS — F17210 Nicotine dependence, cigarettes, uncomplicated: Secondary | ICD-10-CM | POA: Insufficient documentation

## 2016-10-31 DIAGNOSIS — I509 Heart failure, unspecified: Secondary | ICD-10-CM | POA: Diagnosis not present

## 2016-10-31 MED ORDER — HYDROCORTISONE ACETATE 25 MG RE SUPP: 25.0000 mg | Freq: Two times a day (BID) | RECTAL | 0 refills | Status: DC

## 2016-10-31 MED ORDER — LIDOCAINE 5 % EX PTCH: 1.0000 | MEDICATED_PATCH | CUTANEOUS | 0 refills | Status: DC

## 2016-10-31 MED ORDER — ACETAMINOPHEN 500 MG PO TABS: 500.0000 mg | ORAL_TABLET | Freq: Three times a day (TID) | ORAL | 0 refills | Status: DC
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
## 2016-10-31 NOTE — ED Triage Notes (Signed)
Pt c/o chronic lower back pain that has worsened the past week, states the Tramadol she was prescribed last time is not helping. Patient had back surgery in 2007 and states she is waiting on her Medicaid card before she is able to see her back doctor. Patient adds that her hemorrhoids are acting up again - sees a little bright red blood when wiping after having a BM. Patient denies dark stools, no numbness/tingling, or urinary/bowel incontinence. Pt ambulatory with cane.

## 2016-10-31 NOTE — Discharge Instructions (Signed)
If is very important that you stay well hydrated to keep your stools soft.  Use the hydrocortisone suppositories twice a day for your hemorrhoids.  Use the lidocaine patches as needed for back pain.  Take tylenol 3 times a day to help with back pain.  It is important that you follow up with your spine doctor for evaluation of your back pain.  Return to the ER if you develop fevers, chills, loss of bowel or bladder control, inability to walk, or any new or worsening conditions.

## 2016-11-14 ENCOUNTER — Ambulatory Visit: Payer: Self-pay | Admitting: Internal Medicine

## 2016-11-24 ENCOUNTER — Encounter (HOSPITAL_COMMUNITY): Payer: Self-pay | Admitting: Emergency Medicine

## 2016-11-24 ENCOUNTER — Inpatient Hospital Stay (HOSPITAL_COMMUNITY)
Admission: EM | Admit: 2016-11-24 | Discharge: 2016-11-29 | DRG: 638 | Disposition: A | Discharge status: Home / Self Care | Payer: Medicaid Other | Attending: Internal Medicine | Admitting: Internal Medicine

## 2016-11-24 DIAGNOSIS — E785 Hyperlipidemia, unspecified: Secondary | ICD-10-CM | POA: Diagnosis present

## 2016-11-24 DIAGNOSIS — N611 Abscess of the breast and nipple: Secondary | ICD-10-CM | POA: Diagnosis not present

## 2016-11-24 DIAGNOSIS — N3000 Acute cystitis without hematuria: Secondary | ICD-10-CM | POA: Diagnosis present

## 2016-11-24 DIAGNOSIS — Z91048 Other nonmedicinal substance allergy status: Secondary | ICD-10-CM

## 2016-11-24 DIAGNOSIS — E1165 Type 2 diabetes mellitus with hyperglycemia: Secondary | ICD-10-CM

## 2016-11-24 DIAGNOSIS — E1100 Type 2 diabetes mellitus with hyperosmolarity without nonketotic hyperglycemic-hyperosmolar coma (NKHHC): Secondary | ICD-10-CM | POA: Diagnosis present

## 2016-11-24 DIAGNOSIS — I509 Heart failure, unspecified: Secondary | ICD-10-CM | POA: Diagnosis present

## 2016-11-24 DIAGNOSIS — J449 Chronic obstructive pulmonary disease, unspecified: Secondary | ICD-10-CM | POA: Diagnosis present

## 2016-11-24 DIAGNOSIS — I110 Hypertensive heart disease with heart failure: Secondary | ICD-10-CM | POA: Diagnosis present

## 2016-11-24 DIAGNOSIS — M5416 Radiculopathy, lumbar region: Secondary | ICD-10-CM | POA: Diagnosis present

## 2016-11-24 DIAGNOSIS — E039 Hypothyroidism, unspecified: Secondary | ICD-10-CM | POA: Diagnosis present

## 2016-11-24 DIAGNOSIS — E1101 Type 2 diabetes mellitus with hyperosmolarity with coma: Secondary | ICD-10-CM | POA: Diagnosis not present

## 2016-11-24 DIAGNOSIS — I10 Essential (primary) hypertension: Secondary | ICD-10-CM | POA: Diagnosis present

## 2016-11-24 DIAGNOSIS — Z79899 Other long term (current) drug therapy: Secondary | ICD-10-CM | POA: Diagnosis not present

## 2016-11-24 DIAGNOSIS — Z23 Encounter for immunization: Secondary | ICD-10-CM

## 2016-11-24 DIAGNOSIS — Z833 Family history of diabetes mellitus: Secondary | ICD-10-CM

## 2016-11-24 DIAGNOSIS — L03313 Cellulitis of chest wall: Secondary | ICD-10-CM | POA: Diagnosis present

## 2016-11-24 DIAGNOSIS — E1110 Type 2 diabetes mellitus with ketoacidosis without coma: Secondary | ICD-10-CM | POA: Diagnosis present

## 2016-11-24 DIAGNOSIS — N61 Mastitis without abscess: Secondary | ICD-10-CM | POA: Diagnosis present

## 2016-11-24 DIAGNOSIS — R739 Hyperglycemia, unspecified: Secondary | ICD-10-CM

## 2016-11-24 LAB — CBC WITH DIFFERENTIAL/PLATELET
Basophils Absolute: 0 10*3/uL (ref 0.0–0.1)
Basophils Relative: 0 %
EOS ABS: 0.1 10*3/uL (ref 0.0–0.7)
EOS PCT: 1 %
HCT: 38.5 % (ref 36.0–46.0)
Hemoglobin: 12.9 g/dL (ref 12.0–15.0)
Lymphocytes Relative: 33 %
Lymphs Abs: 2.1 10*3/uL (ref 0.7–4.0)
MCH: 30.1 pg (ref 26.0–34.0)
MCHC: 33.5 g/dL (ref 30.0–36.0)
MCV: 89.7 fL (ref 78.0–100.0)
MONO ABS: 0.4 10*3/uL (ref 0.1–1.0)
Monocytes Relative: 7 %
NEUTROS PCT: 59 %
Neutro Abs: 3.8 10*3/uL (ref 1.7–7.7)
PLATELETS: 192 10*3/uL (ref 150–400)
RBC: 4.29 MIL/uL (ref 3.87–5.11)
RDW: 14 % (ref 11.5–15.5)
SMEAR REVIEW: ADEQUATE
WBC: 6.4 10*3/uL (ref 4.0–10.5)

## 2016-11-24 LAB — URINALYSIS, ROUTINE W REFLEX MICROSCOPIC
Bilirubin Urine: NEGATIVE
Glucose, UA: >=500 mg/dL — AB
KETONES UR: 20 mg/dL — AB
Nitrite: NEGATIVE
PH: 6 (ref 5.0–8.0)
PROTEIN: NEGATIVE mg/dL
Specific Gravity, Urine: 1.029 (ref 1.005–1.030)

## 2016-11-24 LAB — CBC
HEMATOCRIT: 41.5 % (ref 36.0–46.0)
HEMOGLOBIN: 13.8 g/dL (ref 12.0–15.0)
MCH: 29.9 pg (ref 26.0–34.0)
MCHC: 33.3 g/dL (ref 30.0–36.0)
MCV: 90 fL (ref 78.0–100.0)
Platelets: 254 10*3/uL (ref 150–400)
RBC: 4.61 MIL/uL (ref 3.87–5.11)
RDW: 14.2 % (ref 11.5–15.5)
WBC: 8 10*3/uL (ref 4.0–10.5)

## 2016-11-24 LAB — CBG MONITORING, ED
GLUCOSE-CAPILLARY: 583 mg/dL — AB (ref 65–99)
GLUCOSE-CAPILLARY: 473 mg/dL — AB (ref 65–99)
GLUCOSE-CAPILLARY: 291 mg/dL — AB (ref 65–99)
GLUCOSE-CAPILLARY: 178 mg/dL — AB (ref 65–99)
GLUCOSE-CAPILLARY: 145 mg/dL — AB (ref 65–99)
GLUCOSE-CAPILLARY: 120 mg/dL — AB (ref 65–99)
GLUCOSE-CAPILLARY: 311 mg/dL — AB (ref 65–99)
Glucose-Capillary: 180 mg/dL — ABNORMAL HIGH (ref 65–99)
Glucose-Capillary: 223 mg/dL — ABNORMAL HIGH (ref 65–99)
Glucose-Capillary: 357 mg/dL — ABNORMAL HIGH (ref 65–99)
Glucose-Capillary: >600 mg/dL (ref 65–99)

## 2016-11-24 LAB — BASIC METABOLIC PANEL
ANION GAP: 10 (ref 5–15)
ANION GAP: 10 (ref 5–15)
ANION GAP: 14 (ref 5–15)
Anion gap: 10 (ref 5–15)
BUN: 5 mg/dL — ABNORMAL LOW (ref 6–20)
BUN: 9 mg/dL (ref 6–20)
BUN: <5 mg/dL — ABNORMAL LOW (ref 6–20)
BUN: <5 mg/dL — ABNORMAL LOW (ref 6–20)
CALCIUM: 8.1 mg/dL — AB (ref 8.9–10.3)
CALCIUM: 8.3 mg/dL — AB (ref 8.9–10.3)
CHLORIDE: 103 mmol/L (ref 101–111)
CHLORIDE: 104 mmol/L (ref 101–111)
CHLORIDE: 95 mmol/L — AB (ref 101–111)
CO2: 21 mmol/L — ABNORMAL LOW (ref 22–32)
CO2: 20 mmol/L — AB (ref 22–32)
CO2: 21 mmol/L — AB (ref 22–32)
CO2: 20 mmol/L — ABNORMAL LOW (ref 22–32)
Calcium: 9.8 mg/dL (ref 8.9–10.3)
Calcium: 8.6 mg/dL — ABNORMAL LOW (ref 8.9–10.3)
Chloride: 107 mmol/L (ref 101–111)
Creatinine, Ser: 1.54 mg/dL — ABNORMAL HIGH (ref 0.44–1.00)
Creatinine, Ser: 0.88 mg/dL (ref 0.44–1.00)
Creatinine, Ser: 0.95 mg/dL (ref 0.44–1.00)
Creatinine, Ser: 0.99 mg/dL (ref 0.44–1.00)
GFR calc Af Amer: 43 mL/min — ABNORMAL LOW (ref >60)
GFR calc Af Amer: >60 mL/min (ref >60)
GFR calc Af Amer: >60 mL/min (ref >60)
GFR calc non Af Amer: 37 mL/min — ABNORMAL LOW (ref >60)
GFR calc non Af Amer: >60 mL/min (ref >60)
GFR calc non Af Amer: >60 mL/min (ref >60)
GFR calc non Af Amer: >60 mL/min (ref >60)
GLUCOSE: 412 mg/dL — AB (ref 65–99)
GLUCOSE: 602 mg/dL — AB (ref 65–99)
Glucose, Bld: 170 mg/dL — ABNORMAL HIGH (ref 65–99)
Glucose, Bld: 267 mg/dL — ABNORMAL HIGH (ref 65–99)
POTASSIUM: 3.5 mmol/L (ref 3.5–5.1)
Potassium: 4.4 mmol/L (ref 3.5–5.1)
Potassium: 3.1 mmol/L — ABNORMAL LOW (ref 3.5–5.1)
Potassium: 4 mmol/L (ref 3.5–5.1)
SODIUM: 137 mmol/L (ref 135–145)
Sodium: 130 mmol/L — ABNORMAL LOW (ref 135–145)
Sodium: 135 mmol/L (ref 135–145)
Sodium: 133 mmol/L — ABNORMAL LOW (ref 135–145)

## 2016-11-24 LAB — COMPREHENSIVE METABOLIC PANEL
ALT: 19 U/L (ref 14–54)
AST: 18 U/L (ref 15–41)
Albumin: 2.9 g/dL — ABNORMAL LOW (ref 3.5–5.0)
Alkaline Phosphatase: 140 U/L — ABNORMAL HIGH (ref 38–126)
Anion gap: 17 — ABNORMAL HIGH (ref 5–15)
BUN: 8 mg/dL (ref 6–20)
CALCIUM: 9 mg/dL (ref 8.9–10.3)
CHLORIDE: 98 mmol/L — AB (ref 101–111)
CO2: 18 mmol/L — ABNORMAL LOW (ref 22–32)
CREATININE: 1.36 mg/dL — AB (ref 0.44–1.00)
GFR, EST AFRICAN AMERICAN: 50 mL/min — AB (ref >60)
GFR, EST NON AFRICAN AMERICAN: 43 mL/min — AB (ref >60)
Glucose, Bld: 489 mg/dL — ABNORMAL HIGH (ref 65–99)
Potassium: 3.3 mmol/L — ABNORMAL LOW (ref 3.5–5.1)
Sodium: 133 mmol/L — ABNORMAL LOW (ref 135–145)
Total Bilirubin: 1 mg/dL (ref 0.3–1.2)
Total Protein: 5.5 g/dL — ABNORMAL LOW (ref 6.5–8.1)

## 2016-11-24 LAB — TROPONIN I

## 2016-11-24 LAB — GLUCOSE, CAPILLARY: Glucose-Capillary: 387 mg/dL — ABNORMAL HIGH (ref 65–99)

## 2016-11-24 LAB — LACTIC ACID, PLASMA
Lactic Acid, Venous: 1.4 mmol/L (ref 0.5–1.9)
Lactic Acid, Venous: 0.8 mmol/L (ref 0.5–1.9)

## 2016-11-24 LAB — HIV ANTIBODY (ROUTINE TESTING W REFLEX): HIV SCREEN 4TH GENERATION: NONREACTIVE

## 2016-11-24 MED ORDER — VANCOMYCIN HCL 10 G IV SOLR
2000.0000 mg | Freq: Once | INTRAVENOUS | Status: AC
  Administered 2016-11-24: 2000 mg via INTRAVENOUS
  Filled 2016-11-24: qty 2000

## 2016-11-24 MED ORDER — ACETAMINOPHEN 325 MG PO TABS
650.0000 mg | ORAL_TABLET | Freq: Four times a day (QID) | ORAL | Status: DC | PRN
  Administered 2016-11-24: 650 mg via ORAL
  Filled 2016-11-24: qty 2

## 2016-11-24 MED ORDER — SODIUM CHLORIDE 0.9 % IV BOLUS (SEPSIS)
1000.0000 mL | Freq: Once | INTRAVENOUS | Status: AC
  Administered 2016-11-24: 1000 mL via INTRAVENOUS

## 2016-11-24 MED ORDER — VANCOMYCIN HCL 10 G IV SOLR
1250.0000 mg | INTRAVENOUS | Status: DC
  Administered 2016-11-25 – 2016-11-26 (×2): 1250 mg via INTRAVENOUS
  Filled 2016-11-24 (×3): qty 1250

## 2016-11-24 MED ORDER — INSULIN STARTER KIT- SYRINGES (ENGLISH)
1.0000 | Freq: Once | Status: AC
  Administered 2016-11-26: 1
  Filled 2016-11-24: qty 1

## 2016-11-24 MED ORDER — INSULIN GLARGINE 100 UNIT/ML ~~LOC~~ SOLN
20.0000 [IU] | Freq: Every day | SUBCUTANEOUS | Status: DC
  Administered 2016-11-25: 20 [IU] via SUBCUTANEOUS
  Filled 2016-11-24 (×2): qty 0.2

## 2016-11-24 MED ORDER — ONDANSETRON HCL 4 MG PO TABS: 4.0000 mg | ORAL_TABLET | Freq: Four times a day (QID) | ORAL | Status: DC | PRN

## 2016-11-24 MED ORDER — SODIUM CHLORIDE 0.9 % IV BOLUS (SEPSIS)
2000.0000 mL | Freq: Once | INTRAVENOUS | Status: AC
  Administered 2016-11-24: 2000 mL via INTRAVENOUS

## 2016-11-24 MED ORDER — POTASSIUM CHLORIDE CRYS ER 20 MEQ PO TBCR
40.0000 meq | EXTENDED_RELEASE_TABLET | Freq: Once | ORAL | Status: AC
  Administered 2016-11-24: 40 meq via ORAL
  Filled 2016-11-24: qty 2

## 2016-11-24 MED ORDER — LEVOTHYROXINE SODIUM 25 MCG PO TABS
125.0000 ug | ORAL_TABLET | Freq: Every day | ORAL | Status: DC
  Administered 2016-11-25 – 2016-11-29 (×5): 125 ug via ORAL
  Filled 2016-11-24 (×7): qty 1

## 2016-11-24 MED ORDER — SODIUM CHLORIDE 0.9 % IV SOLN: INTRAVENOUS | Status: DC

## 2016-11-24 MED ORDER — INSULIN REGULAR BOLUS VIA INFUSION
0.0000 [IU] | Freq: Three times a day (TID) | INTRAVENOUS | Status: DC
  Filled 2016-11-24: qty 10

## 2016-11-24 MED ORDER — DEXTROSE 50 % IV SOLN: 25.0000 mL | INTRAVENOUS | Status: DC | PRN

## 2016-11-24 MED ORDER — ALBUTEROL SULFATE (2.5 MG/3ML) 0.083% IN NEBU: 2.5000 mg | INHALATION_SOLUTION | Freq: Four times a day (QID) | RESPIRATORY_TRACT | Status: DC | PRN

## 2016-11-24 MED ORDER — ATORVASTATIN CALCIUM 40 MG PO TABS
40.0000 mg | ORAL_TABLET | Freq: Every day | ORAL | Status: DC
  Administered 2016-11-25 – 2016-11-29 (×5): 40 mg via ORAL
  Filled 2016-11-24 (×7): qty 1

## 2016-11-24 MED ORDER — LORATADINE 10 MG PO TABS
10.0000 mg | ORAL_TABLET | Freq: Every day | ORAL | Status: DC
  Administered 2016-11-24 – 2016-11-29 (×6): 10 mg via ORAL
  Filled 2016-11-24 (×6): qty 1

## 2016-11-24 MED ORDER — SODIUM CHLORIDE 0.9 % IV SOLN: INTRAVENOUS | Status: AC

## 2016-11-24 MED ORDER — SODIUM CHLORIDE 0.9 % IV SOLN
INTRAVENOUS | Status: DC
  Administered 2016-11-24: 05:00:00 via INTRAVENOUS

## 2016-11-24 MED ORDER — SODIUM CHLORIDE 0.9 % IV SOLN
INTRAVENOUS | Status: DC
  Administered 2016-11-24: 5.2 [IU]/h via INTRAVENOUS

## 2016-11-24 MED ORDER — LIVING WELL WITH DIABETES BOOK
Freq: Once | Status: DC
  Filled 2016-11-24: qty 1

## 2016-11-24 MED ORDER — PANTOPRAZOLE SODIUM 40 MG PO TBEC
40.0000 mg | DELAYED_RELEASE_TABLET | Freq: Every day | ORAL | Status: DC
  Administered 2016-11-24 – 2016-11-29 (×6): 40 mg via ORAL
  Filled 2016-11-24 (×6): qty 1

## 2016-11-24 MED ORDER — PIPERACILLIN-TAZOBACTAM 3.375 G IVPB
3.3750 g | Freq: Three times a day (TID) | INTRAVENOUS | Status: DC
  Administered 2016-11-24 – 2016-11-27 (×10): 3.375 g via INTRAVENOUS
  Filled 2016-11-24 (×12): qty 50

## 2016-11-24 MED ORDER — DEXTROSE-NACL 5-0.45 % IV SOLN: INTRAVENOUS | Status: DC

## 2016-11-24 MED ORDER — NYSTATIN 100000 UNIT/GM EX CREA
TOPICAL_CREAM | Freq: Two times a day (BID) | CUTANEOUS | Status: DC
  Administered 2016-11-24 – 2016-11-26 (×5): via TOPICAL
  Administered 2016-11-26: 1 via TOPICAL
  Administered 2016-11-27 (×2): via TOPICAL
  Administered 2016-11-28: 1 via TOPICAL
  Administered 2016-11-28 – 2016-11-29 (×2): via TOPICAL
  Filled 2016-11-24 (×3): qty 15

## 2016-11-24 MED ORDER — TRAMADOL HCL 50 MG PO TABS
50.0000 mg | ORAL_TABLET | Freq: Four times a day (QID) | ORAL | Status: DC | PRN
  Administered 2016-11-27 – 2016-11-29 (×4): 50 mg via ORAL
  Filled 2016-11-24 (×4): qty 1

## 2016-11-24 MED ORDER — INSULIN ASPART 100 UNIT/ML ~~LOC~~ SOLN
0.0000 [IU] | Freq: Three times a day (TID) | SUBCUTANEOUS | Status: DC
  Administered 2016-11-24: 11 [IU] via SUBCUTANEOUS
  Administered 2016-11-25: 15 [IU] via SUBCUTANEOUS
  Filled 2016-11-24: qty 1

## 2016-11-24 MED ORDER — ONDANSETRON HCL 4 MG/2ML IJ SOLN: 4.0000 mg | Freq: Four times a day (QID) | INTRAMUSCULAR | Status: DC | PRN

## 2016-11-24 MED ORDER — INSULIN ASPART 100 UNIT/ML ~~LOC~~ SOLN
0.0000 [IU] | Freq: Every day | SUBCUTANEOUS | Status: DC
  Administered 2016-11-24 – 2016-11-25 (×2): 5 [IU] via SUBCUTANEOUS
  Administered 2016-11-26: 4 [IU] via SUBCUTANEOUS
  Administered 2016-11-27: 3 [IU] via SUBCUTANEOUS
  Administered 2016-11-28: 2 [IU] via SUBCUTANEOUS

## 2016-11-24 MED ORDER — DEXTROSE 5 % IV SOLN
1.0000 g | Freq: Once | INTRAVENOUS | Status: AC
  Administered 2016-11-24: 1 g via INTRAVENOUS
  Filled 2016-11-24: qty 10

## 2016-11-24 MED ORDER — ACETAMINOPHEN 650 MG RE SUPP: 650.0000 mg | Freq: Four times a day (QID) | RECTAL | Status: DC | PRN

## 2016-11-24 MED ORDER — FLUOXETINE HCL 20 MG PO CAPS
20.0000 mg | ORAL_CAPSULE | Freq: Every day | ORAL | Status: DC
  Administered 2016-11-25 – 2016-11-29 (×5): 20 mg via ORAL
  Filled 2016-11-24 (×7): qty 1

## 2016-11-24 MED ORDER — SODIUM CHLORIDE 0.9 % IV SOLN
INTRAVENOUS | Status: DC
  Filled 2016-11-24: qty 1

## 2016-11-24 NOTE — ED Notes (Signed)
Pt crying -- c/o severe itching in pubic/vaginal area-- states she has had same and uses special soap "dove"-- area is red/irritated looking.

## 2016-11-24 NOTE — Progress Notes (Signed)
Received pt, alert and oriented x 4. Pt on 2 L O2 per nasal cannula. Pt denies pain at this time.

## 2016-11-24 NOTE — ED Notes (Addendum)
Lunch meal given. Paged Dr. Cena Benton of pt's latest CBG and iinsulin gtt discontinued.

## 2016-11-24 NOTE — Progress Notes (Signed)
Inpatient Diabetes Program Recommendations  AACE/ADA: New Consensus Statement on Inpatient Glycemic Control (2015)  Target Ranges:  Prepandial:   less than 140 mg/dL      Peak postprandial:   less than 180 mg/dL (1-2 hours)      Critically ill patients:  140 - 180 mg/dL   Lab Results  Component Value Date   GLUCAP 120 (H) 11/24/2016    Review of Glycemic ControlResults for [name], [name] (MRN [id number]) as of 11/24/2016 14:10  Ref. Range 11/24/2016 00:50 11/24/2016 03:49 11/24/2016 05:07 11/24/2016 06:14 11/24/2016 07:19 11/24/2016 08:39 11/24/2016 09:54 11/24/2016 11:05 11/24/2016 12:10 11/24/2016 13:26  Glucose-Capillary Latest Ref Range: 65 - 99 mg/dL >191 (HH) 478 (HH) 295 (H) 357 (H) 291 (H) 178 (H) 223 (H) 180 (H) 145 (H) 120 (H)   Diabetes history: Type 2 diabetes Outpatient Diabetes medications: None listed Current orders for Inpatient glycemic control:  IV insulin- stopped  Inpatient Diabetes Program Recommendations:   Sent text page to Dr. Cena Benton requesting basal insulin-Lantus 20 units daily and Novolog correction.  A1C pending.  Will likely need addition of diabetes medications at d/c and f/u with PCP.    Thanks, Beryl Meager, RN, BC-ADM Inpatient Diabetes Coordinator Pager [phone] (8a-5p)

## 2016-11-24 NOTE — Progress Notes (Signed)
Inpatient Diabetes Program Recommendations  AACE/ADA: New Consensus Statement on Inpatient Glycemic Control (2015)  Target Ranges:  Prepandial:   less than 140 mg/dL      Peak postprandial:   less than 180 mg/dL (1-2 hours)      Critically ill patients:  140 - 180 mg/dL   Lab Results  Component Value Date   GLUCAP 120 (H) 11/24/2016    Review of Glycemic Control  Inpatient Diabetes Program Recommendations:  Spoke with patient @ bedside concerning hx diabetes. Patient states she was diagnosed approx. 1 year ago with type 2 diabetes but has never taken any medications for the diabetes. Ordered Living Well With Diabetes book along with insulin syringes to have ready to educate if patient discharged on insulin. Patient's meter @ home not working properly and shared her Medicaid should restart next month. Shared with patient cheapest meter @ [employer] is [employer] meter + 50 strips = approx. $18.   Thank you, Billy Fischer. Samir Ishaq, RN, MSN, CDE  Diabetes Coordinator Inpatient Glycemic Control Team Team Pager [phone] (8am-5pm) 11/24/2016 3:00 PM
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
## 2016-11-24 NOTE — ED Triage Notes (Signed)
Patient reports excessive thirst , vaginal yeast infection with dryness/itching and diagnosed with DM last year with no medications . CBG ="High" at triage . Denies fever or chills .

## 2016-11-24 NOTE — Progress Notes (Addendum)
Pharmacy Antibiotic Note  [name] is a 56 y.o. female admitted on 11/24/2016 with cellulitis, bilateral breasts.  Pharmacy has been consulted for Vancomycin/Zosyn dosing. Wt: ~120kg. WBC WNL. Noted renal dysfunction with normalized CrCl ~47.   Plan: Vancomycin 2000 mg IV x 1, then 1250 mg IV q24h, increase dose if renal function improves  Zosyn 3.375G IV q8h to be infused over 4 hours Trend WBC, temp, renal function  F/U infectious work-up Drug levels as indicated  No data recorded.   Recent Labs Lab 11/24/16 0054  WBC 8.0  CREATININE 1.54*    CrCl cannot be calculated (Unknown ideal weight.).    Allergies  Allergen Reactions  . Tape Rash    Abran Duke 11/24/2016 3:18 AM
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
## 2016-11-24 NOTE — Progress Notes (Signed)
Patient seen and evaluated earlier this AM by my associate. Please refer to H and P for details regarding assessment and plan.   Had orders for telemetry bed but that was changed to stepdown. Also advanced diet to diabetic diet given blood sugars in the 100's.   Gen: pt in nad, alert and awake, sitting up smiling at times CV: no cyanosis Pulm: no increased wob, no wheezes  [name]  Will reassess next am.  Replace K and reassess

## 2016-11-25 ENCOUNTER — Encounter (HOSPITAL_COMMUNITY): Payer: Self-pay

## 2016-11-25 LAB — URINE CULTURE

## 2016-11-25 LAB — GLUCOSE, CAPILLARY
GLUCOSE-CAPILLARY: 424 mg/dL — AB (ref 65–99)
Glucose-Capillary: 425 mg/dL — ABNORMAL HIGH (ref 65–99)
Glucose-Capillary: 310 mg/dL — ABNORMAL HIGH (ref 65–99)
Glucose-Capillary: 346 mg/dL — ABNORMAL HIGH (ref 65–99)
Glucose-Capillary: 394 mg/dL — ABNORMAL HIGH (ref 65–99)

## 2016-11-25 MED ORDER — INFLUENZA VAC SPLIT QUAD 0.5 ML IM SUSY
0.5000 mL | PREFILLED_SYRINGE | INTRAMUSCULAR | Status: AC
  Administered 2016-11-26: 0.5 mL via INTRAMUSCULAR
  Filled 2016-11-25: qty 0.5

## 2016-11-25 MED ORDER — INSULIN GLARGINE 100 UNIT/ML ~~LOC~~ SOLN
25.0000 [IU] | Freq: Every day | SUBCUTANEOUS | Status: DC
  Administered 2016-11-26: 25 [IU] via SUBCUTANEOUS
  Filled 2016-11-25: qty 0.25

## 2016-11-25 MED ORDER — INSULIN ASPART 100 UNIT/ML ~~LOC~~ SOLN
0.0000 [IU] | Freq: Three times a day (TID) | SUBCUTANEOUS | Status: DC
  Administered 2016-11-25 (×2): 15 [IU] via SUBCUTANEOUS
  Administered 2016-11-26: 20 [IU] via SUBCUTANEOUS
  Administered 2016-11-26: 7 [IU] via SUBCUTANEOUS
  Administered 2016-11-26 – 2016-11-27 (×2): 20 [IU] via SUBCUTANEOUS
  Administered 2016-11-27: 15 [IU] via SUBCUTANEOUS
  Administered 2016-11-27: 11 [IU] via SUBCUTANEOUS
  Administered 2016-11-28: 15 [IU] via SUBCUTANEOUS
  Administered 2016-11-28: 20 [IU] via SUBCUTANEOUS
  Administered 2016-11-28 – 2016-11-29 (×2): 11 [IU] via SUBCUTANEOUS
  Administered 2016-11-29: 7 [IU] via SUBCUTANEOUS

## 2016-11-25 MED ORDER — CALCIUM CARBONATE ANTACID 500 MG PO CHEW
1.0000 | CHEWABLE_TABLET | Freq: Once | ORAL | Status: AC
  Administered 2016-11-25: 200 mg via ORAL
  Filled 2016-11-25: qty 1

## 2016-11-25 MED ORDER — INSULIN ASPART 100 UNIT/ML ~~LOC~~ SOLN
5.0000 [IU] | Freq: Once | SUBCUTANEOUS | Status: AC
  Administered 2016-11-25: 5 [IU] via SUBCUTANEOUS

## 2016-11-25 NOTE — Progress Notes (Signed)
PROGRESS NOTE    [name]  OIT:[id number] DOB: 1961/02/24 DOA: 11/24/2016 PCP: Patient, No Pcp Per    Brief Narrative:   56 y.o. female with history of diabetes mellitus type 2 has not been taking medications, hypertension, hypothyroidism and hyperlipidemia presents to the ER because she has been running elevated blood sugar last few days. Patient states her glucometer has not able to record the readings. In addition patient also has been noticing some breast abscess which has eventually started draining. Breast abscess present in both breasts.   Assessment & Plan:   Principal Problem:   Hyperosmolar non-ketotic state in patient with type 2 diabetes mellitus (HCC)/Uncontrolled type 2 diabetes mellitus with hyperglycemia (HCC)  - Continue sliding scale insulin and increase Lantus to 25 units daily  Active Problems:   Hypertension - Stable currently off of antihypertensives    Hypothyroidism - Stable on Synthroid    COPD (chronic obstructive pulmonary disease) (HCC) - Currently compensated. Continue current medical regimen    Breast abscess - Most likely cause of exacerbated principal problem with elevated blood sugars. -Continue IV antibiotics   DVT prophylaxis: Early ambulation and SCD's Code Status: Full Family Communication: None at bedside Disposition Plan: Once blood sugars better controlled   Consultants:   None   Procedures: None  Antimicrobials: Zosyn and Vancomycin  Subjective: Pt has no new complaints, no acute issues overnight.   Objective: Vitals:   11/24/16 1900 11/24/16 2031 11/25/16 0405 11/25/16 1425  BP: (!) 133/97 (!) 135/58 (!) 104/57 (!) 127/50  Pulse: 86 80 78 69  Resp:  18 18 18   Temp:  97.8 F (36.6 C) 98.5 F (36.9 C) 98.1 F (36.7 C)  TempSrc:  Oral  Oral  SpO2: 98% 95% 98% 100%  Weight:      Height:        Intake/Output Summary (Last 24 hours) at 11/25/16 1742 Last data filed at 11/25/16 1018  Gross per 24 hour  Intake              1935 ml  Output             2300 ml  Net             -365 ml   Filed Weights   11/24/16 1000  Weight: 120.2 kg (264 lb 15.9 oz)    Examination:  General exam: Appears calm and comfortable , In no acute distress Respiratory system: Clear to auscultation. Respiratory effort normal. Cardiovascular system: S1 & S2 heard, RRR. No JVD, murmurs, rubs, gallops or clicks. Gastrointestinal system: Abdomen is nondistended, soft and nontender. No guarding Normal bowel sounds heard. Central nervous system: Alert and oriented. No focal neurological deficits. Extremities: Symmetric 5 x 5 power. Skin: Patient has ulcerations and some cellulitis at right lower. Psychiatry: Judgement and insight appear normal. Mood & affect appropriate.   Data Reviewed: I have personally reviewed following labs and imaging studies  CBC:  Recent Labs Lab 11/24/16 0054 11/24/16 0503  WBC 8.0 6.4  NEUTROABS  --  3.8  HGB 13.8 12.9  HCT 41.5 38.5  MCV 90.0 89.7  PLT 254 583   Basic Metabolic Panel:  Recent Labs Lab 11/24/16 0054 11/24/16 0503 11/24/16 0847 11/24/16 1548 11/24/16 1929  NA 130* 133* 137 135 133*  K 4.4 3.3* 3.1* 4.0 3.5  CL 95* 98* 107 104 103  CO2 21* 18* 20* 21* 20*  GLUCOSE 602* 489* 170* 267* 412*  BUN 9 8 <5* <5* 5*  CREATININE 1.54* 1.36* 0.88 0.95 0.99  CALCIUM 9.8 9.0 8.6* 8.1* 8.3*   GFR: Estimated Creatinine Clearance: 83 mL/min (by C-G formula based on SCr of 0.99 mg/dL). Liver Function Tests:  Recent Labs Lab 11/24/16 0503  AST 18  ALT 19  ALKPHOS 140*  BILITOT 1.0  PROT 5.5*  ALBUMIN 2.9*   No results for input(s): LIPASE, AMYLASE in the last 168 hours. No results for input(s): AMMONIA in the last 168 hours. Coagulation Profile: No results for input(s): INR, PROTIME in the last 168 hours. Cardiac Enzymes:  Recent Labs Lab 11/24/16 0847  TROPONINI <0.03   BNP (last 3 results) No results for input(s): PROBNP in the last 8760  hours. HbA1C: No results for input(s): HGBA1C in the last 72 hours. CBG:  Recent Labs Lab 11/24/16 2046 11/25/16 0808 11/25/16 0946 11/25/16 1235 11/25/16 1649  GLUCAP 387* 425* 424* 310* 346*   Lipid Profile: No results for input(s): CHOL, HDL, LDLCALC, TRIG, CHOLHDL, LDLDIRECT in the last 72 hours. Thyroid Function Tests: No results for input(s): TSH, T4TOTAL, FREET4, T3FREE, THYROIDAB in the last 72 hours. Anemia Panel: No results for input(s): VITAMINB12, FOLATE, FERRITIN, TIBC, IRON, RETICCTPCT in the last 72 hours. Sepsis Labs:  Recent Labs Lab 11/24/16 0847 11/24/16 1548  LATICACIDVEN 1.4 0.8    Recent Results (from the past 240 hour(s))  Urine Culture     Status: Abnormal   Collection Time: 11/24/16 12:56 AM  Result Value Ref Range Status   Specimen Description Urine  Final   Special Requests NONE  Final   Culture MULTIPLE SPECIES PRESENT, SUGGEST RECOLLECTION (A)  Final   Report Status 11/25/2016 FINAL  Final  Wound or Superficial Culture     Status: None (Preliminary result)   Collection Time: 11/24/16  5:41 AM  Result Value Ref Range Status   Specimen Description WOUND BREAST  Final   Special Requests NONE  Final   Gram Stain   Final    ABUNDANT WBC PRESENT,BOTH PMN AND MONONUCLEAR ABUNDANT GRAM POSITIVE RODS FEW GRAM POSITIVE COCCI IN PAIRS    Culture PENDING  Incomplete   Report Status PENDING  Incomplete      Radiology Studies: No results found.   Scheduled Meds: . atorvastatin  40 mg Oral Daily  . FLUoxetine  20 mg Oral Daily  . [START ON 11/26/2016] Influenza vac split quadrivalent PF  0.5 mL Intramuscular Tomorrow-1000  . insulin aspart  0-20 Units Subcutaneous TID WC  . insulin aspart  0-5 Units Subcutaneous QHS  . [START ON 11/26/2016] insulin glargine  25 Units Subcutaneous Daily  . insulin starter kit- syringes  1 kit Other Once  . levothyroxine  125 mcg Oral QAC breakfast  . living well with diabetes book   Does not apply Once  .  loratadine  10 mg Oral Daily  . nystatin cream   Topical BID  . pantoprazole  40 mg Oral Daily   Continuous Infusions: . piperacillin-tazobactam (ZOSYN)  IV 3.375 g (11/25/16 1343)  . vancomycin Stopped (11/25/16 0707)     LOS: 1 day    Time spent: > 35 minutes  Velvet Bathe, MD [facility] Pager [phone]  If 7PM-7AM, please contact night-coverage [email or web address] Password [facility] 11/25/2016, 5:42 PM

## 2016-11-26 LAB — CBC
HCT: 38.6 % (ref 36.0–46.0)
HEMOGLOBIN: 12.8 g/dL (ref 12.0–15.0)
MCH: 30 pg (ref 26.0–34.0)
MCHC: 33.2 g/dL (ref 30.0–36.0)
MCV: 90.6 fL (ref 78.0–100.0)
PLATELETS: 207 10*3/uL (ref 150–400)
RBC: 4.26 MIL/uL (ref 3.87–5.11)
RDW: 13.9 % (ref 11.5–15.5)
WBC: 5.7 10*3/uL (ref 4.0–10.5)

## 2016-11-26 LAB — GLUCOSE, CAPILLARY
GLUCOSE-CAPILLARY: 499 mg/dL — AB (ref 65–99)
GLUCOSE-CAPILLARY: 535 mg/dL — AB (ref 65–99)
GLUCOSE-CAPILLARY: 232 mg/dL — AB (ref 65–99)
Glucose-Capillary: 339 mg/dL — ABNORMAL HIGH (ref 65–99)
Glucose-Capillary: 232 mg/dL — ABNORMAL HIGH (ref 65–99)
Glucose-Capillary: 309 mg/dL — ABNORMAL HIGH (ref 65–99)

## 2016-11-26 LAB — OCCULT BLOOD X 1 CARD TO LAB, STOOL: FECAL OCCULT BLD: POSITIVE — AB

## 2016-11-26 MED ORDER — VANCOMYCIN HCL IN DEXTROSE 750-5 MG/150ML-% IV SOLN
750.0000 mg | Freq: Two times a day (BID) | INTRAVENOUS | Status: DC
  Administered 2016-11-26 – 2016-11-27 (×2): 750 mg via INTRAVENOUS
  Filled 2016-11-26 (×3): qty 150

## 2016-11-26 MED ORDER — INSULIN GLARGINE 100 UNIT/ML ~~LOC~~ SOLN
28.0000 [IU] | Freq: Every day | SUBCUTANEOUS | Status: DC
  Administered 2016-11-27: 28 [IU] via SUBCUTANEOUS
  Filled 2016-11-26: qty 0.28

## 2016-11-26 NOTE — Progress Notes (Signed)
PROGRESS NOTE    [name]  WUJ:[id number] DOB: Jan 06, 1961 DOA: 11/24/2016 PCP: Patient, No Pcp Per    Brief Narrative:   56 y.o. female with history of diabetes mellitus type 2 has not been taking medications, hypertension, hypothyroidism and hyperlipidemia presents to the ER because she has been running elevated blood sugar last few days. Patient states her glucometer has not able to record the readings. In addition patient also has been noticing some breast abscess which has eventually started draining. Breast abscess present in both breasts.   Assessment & Plan:   Principal Problem:   Hyperosmolar non-ketotic state in patient with type 2 diabetes mellitus (HCC)/Uncontrolled type 2 diabetes mellitus with hyperglycemia (HCC)  - Continue sliding scale insulin and increase Lantus to 28 units daily  Active Problems:   Hypertension - Stable currently off of antihypertensives    Hypothyroidism - Stable on Synthroid    COPD (chronic obstructive pulmonary disease) (HCC) - Currently compensated. Continue current medical regimen    Breast abscess - Increasing blood sugars most likely -Will continue IV antibiotics  DVT prophylaxis: Early ambulation and SCD's Code Status: Full Family Communication: None at bedside Disposition Plan: Once blood sugars better controlled   Consultants:   None   Procedures: None  Antimicrobials: Zosyn and Vancomycin  Subjective: Pt has no new complaints, no acute issues overnight.   Objective: Vitals:   11/25/16 0405 11/25/16 1425 11/25/16 2136 11/26/16 0421  BP: (!) 104/57 (!) 127/50 128/74 (!) 139/59  Pulse: 78 69 83 68  Resp: Temp: 98.5 F (36.9 C) 98.1 F (36.7 C) 98.1 F (36.7 C) 98.2 F (36.8 C)  TempSrc:  Oral Oral Oral  SpO2: 98% 100% 98% 100%  Weight:      Height:        Intake/Output Summary (Last 24 hours) at 11/26/16 1107 Last data filed at 11/26/16 9562  Gross per 24 hour  Intake             1800 ml    Output              800 ml  Net             1000 ml   Filed Weights   11/24/16 1000  Weight: 120.2 kg (264 lb 15.9 oz)    Examination:  General exam: Appears calm and comfortable , In no acute distress Respiratory system: Clear to auscultation. Respiratory effort normal. Cardiovascular system: S1 & S2 heard, RRR. No JVD, murmurs, rubs, gallops or clicks. Gastrointestinal system: Abdomen is nondistended, soft and nontender. No guarding Normal bowel sounds heard. Central nervous system: Alert and oriented. No focal neurological deficits. Extremities: Symmetric 5 x 5 power. Skin: Patient has ulcerations and some cellulitis at right lower quadrant of right breast. Psychiatry: Judgement and insight appear normal. Mood & affect appropriate.   Data Reviewed: I have personally reviewed following labs and imaging studies  CBC:  Recent Labs Lab 11/24/16 0054 11/24/16 0503  WBC 8.0 6.4  NEUTROABS  --  3.8  HGB 13.8 12.9  HCT 41.5 38.5  MCV 90.0 89.7  PLT 254 192   Basic Metabolic Panel:  Recent Labs Lab 11/24/16 0054 11/24/16 0503 11/24/16 0847 11/24/16 1548 11/24/16 1929  NA 130* 133* 137 135 133*  K 4.4 3.3* 3.1* 4.0 3.5  CL 95* 98* 107 104 103  CO2 21* 18* 20* 21* 20*  GLUCOSE 602* 489* 170* 267* 412*  BUN 9 8 <5* <5*  5*  CREATININE 1.54* 1.36* 0.88 0.95 0.99  CALCIUM 9.8 9.0 8.6* 8.1* 8.3*   GFR: Estimated Creatinine Clearance: 83 mL/min (by C-G formula based on SCr of 0.99 mg/dL). Liver Function Tests:  Recent Labs Lab 11/24/16 0503  AST 18  ALT 19  ALKPHOS 140*  BILITOT 1.0  PROT 5.5*  ALBUMIN 2.9*   No results for input(s): LIPASE, AMYLASE in the last 168 hours. No results for input(s): AMMONIA in the last 168 hours. Coagulation Profile: No results for input(s): INR, PROTIME in the last 168 hours. Cardiac Enzymes:  Recent Labs Lab 11/24/16 0847  TROPONINI <0.03   BNP (last 3 results) No results for input(s): PROBNP in the last 8760  hours. HbA1C: No results for input(s): HGBA1C in the last 72 hours. CBG:  Recent Labs Lab 11/25/16 0946 11/25/16 1235 11/25/16 1649 11/25/16 2027 11/26/16 0747  GLUCAP 424* 310* 346* 394* 499*   Lipid Profile: No results for input(s): CHOL, HDL, LDLCALC, TRIG, CHOLHDL, LDLDIRECT in the last 72 hours. Thyroid Function Tests: No results for input(s): TSH, T4TOTAL, FREET4, T3FREE, THYROIDAB in the last 72 hours. Anemia Panel: No results for input(s): VITAMINB12, FOLATE, FERRITIN, TIBC, IRON, RETICCTPCT in the last 72 hours. Sepsis Labs:  Recent Labs Lab 11/24/16 0847 11/24/16 1548  LATICACIDVEN 1.4 0.8    Recent Results (from the past 240 hour(s))  Urine Culture     Status: Abnormal   Collection Time: 11/24/16 12:56 AM  Result Value Ref Range Status   Specimen Description Urine  Final   Special Requests NONE  Final   Culture MULTIPLE SPECIES PRESENT, SUGGEST RECOLLECTION (A)  Final   Report Status 11/25/2016 FINAL  Final  Wound or Superficial Culture     Status: None (Preliminary result)   Collection Time: 11/24/16  5:41 AM  Result Value Ref Range Status   Specimen Description WOUND BREAST  Final   Special Requests NONE  Final   Gram Stain   Final    ABUNDANT WBC PRESENT,BOTH PMN AND MONONUCLEAR ABUNDANT GRAM POSITIVE RODS FEW GRAM POSITIVE COCCI IN PAIRS    Culture PENDING  Incomplete   Report Status PENDING  Incomplete  Culture, blood (routine x 2)     Status: None (Preliminary result)   Collection Time: 11/24/16  8:50 AM  Result Value Ref Range Status   Specimen Description BLOOD LEFT HAND  Final   Special Requests   Final    BOTTLES DRAWN AEROBIC AND ANAEROBIC Blood Culture adequate volume   Culture NO GROWTH 1 DAY  Final   Report Status PENDING  Incomplete  Culture, blood (routine x 2)     Status: None (Preliminary result)   Collection Time: 11/24/16  8:55 AM  Result Value Ref Range Status   Specimen Description BLOOD WRIST LEFT  Final   Special Requests  IN PEDIATRIC BOTTLE Blood Culture adequate volume  Final   Culture NO GROWTH 1 DAY  Final   Report Status PENDING  Incomplete      Radiology Studies: No results found.   Scheduled Meds: . atorvastatin  40 mg Oral Daily  . FLUoxetine  20 mg Oral Daily  . insulin aspart  0-20 Units Subcutaneous TID WC  . insulin aspart  0-5 Units Subcutaneous QHS  . [START ON 11/27/2016] insulin glargine  28 Units Subcutaneous Daily  . levothyroxine  125 mcg Oral QAC breakfast  . living well with diabetes book   Does not apply Once  . loratadine  10 mg Oral Daily  .  nystatin cream   Topical BID  . pantoprazole  40 mg Oral Daily   Continuous Infusions: . piperacillin-tazobactam (ZOSYN)  IV Stopped (11/26/16 0908)  . vancomycin Stopped (11/26/16 1610)     LOS: 2 days    Time spent: > 35 minutes  Penny Pia, MD [facility] Pager [phone]  If 7PM-7AM, please contact night-coverage [email or web address] Password [email or web address] 11/26/2016, 11:07 AM

## 2016-11-26 NOTE — Progress Notes (Signed)
Pharmacy Antibiotic Note  [name] is a 56 y.o. female admitted on 11/24/2016 with cellulitis, bilateral breasts.  Pharmacy has been consulted for Vancomycin/Zosyn dosing. Wt: ~120kg. WBC WNL. Noted renal dysfunction with normalized CrCl ~ 80  Plan: Increase Vancomycin to  IV every 12 hours for improving renal function.  Continue Zosyn 3.375G IV q8h to be infused over 4 hours Trend WBC, temp, renal function  F/U infectious work-up Drug levels as indicated  Suggest restart of insulin drip to help with blood sugar control  Temp (24hrs), Avg:98.1 F (36.7 C), Min:98.1 F (36.7 C), Max:98.2 F (36.8 C)   Recent Labs Lab 11/24/16 0054 11/24/16 0503 11/24/16 0847 11/24/16 1548 11/24/16 1929  WBC 8.0 6.4  --   --   --   CREATININE 1.54* 1.36* 0.88 0.95 0.99  LATICACIDVEN  --   --  1.4 0.8  --     Estimated Creatinine Clearance: 83 mL/min (by C-G formula based on SCr of 0.99 mg/dL).    Allergies  Allergen Reactions  . Tape Rash    Link Snuffer, PharmD, BCPS Clinical Pharmacist Clinical phone 11/26/2016 until 3:[phone] After hours, please call [phone] 11/26/2016 12:33 PM

## 2016-11-27 LAB — GLUCOSE, CAPILLARY
GLUCOSE-CAPILLARY: 393 mg/dL — AB (ref 65–99)
GLUCOSE-CAPILLARY: 302 mg/dL — AB (ref 65–99)
Glucose-Capillary: 291 mg/dL — ABNORMAL HIGH (ref 65–99)
Glucose-Capillary: 297 mg/dL — ABNORMAL HIGH (ref 65–99)

## 2016-11-27 LAB — AEROBIC CULTURE  (SUPERFICIAL SPECIMEN): CULTURE: NORMAL

## 2016-11-27 LAB — AEROBIC CULTURE W GRAM STAIN (SUPERFICIAL SPECIMEN)

## 2016-11-27 LAB — HEMOGLOBIN A1C: Mean Plasma Glucose: >398 mg/dL

## 2016-11-27 MED ORDER — INSULIN GLARGINE 100 UNIT/ML ~~LOC~~ SOLN
32.0000 [IU] | Freq: Every day | SUBCUTANEOUS | Status: DC
  Administered 2016-11-28: 32 [IU] via SUBCUTANEOUS
  Filled 2016-11-27: qty 0.32

## 2016-11-27 MED ORDER — SULFAMETHOXAZOLE-TRIMETHOPRIM 800-160 MG PO TABS
1.0000 | ORAL_TABLET | Freq: Two times a day (BID) | ORAL | Status: DC
  Administered 2016-11-27 – 2016-11-29 (×4): 1 via ORAL
  Filled 2016-11-27 (×5): qty 1

## 2016-11-27 NOTE — Progress Notes (Signed)
Inpatient Diabetes Program Recommendations  AACE/ADA: New Consensus Statement on Inpatient Glycemic Control (2015)  Target Ranges:  Prepandial:   less than 140 mg/dL      Peak postprandial:   less than 180 mg/dL (1-2 hours)      Critically ill patients:  140 - 180 mg/dL   Lab Results  Component Value Date   GLUCAP 393 (H) 11/27/2016   HGBA1C >15.5 (H) 11/24/2016    Review of Glycemic ControlResults for SARABETH, BENTON (MRN [id number]) as of 11/27/2016 11:10  Ref. Range 11/26/2016 13:25 11/26/2016 16:35 11/26/2016 16:52 11/26/2016 20:41 11/27/2016 08:00  Glucose-Capillary Latest Ref Range: 65 - 99 mg/dL 160 (H) 737 (H) 106 (H) 309 (H) 393 (H)   Diabetes history: Type 2 diabetes Outpatient Diabetes medications: None listed Current orders for Inpatient glycemic control:  Novolog resistant tid with meals and HS, Lantus 28 units daily Inpatient Diabetes Program Recommendations:    Please continue to titrate insulin based on fasting blood sugars.  Consider increasing Lantus to 36 units daily.  Also please add Novolog meal coverage 5 units tid with meals.  Called RN to discuss insulin teaching and diabetes videos.  A1C indicates that patient will need both basal insulin and meal coverage insulin prior to d/c- unclear it patient's Medicaid is now restarted?  Will order case management consult.   Thanks, Beryl Meager, RN, BC-ADM Inpatient Diabetes Coordinator Pager [phone] (8a-5p)

## 2016-11-27 NOTE — Consult Note (Addendum)
WOC Nurse wound consult note Reason for Consult: Consult requested for breast folds.  Pt states she had red lesions in the past which rupture and heal.  Left breast with .2X.2cm dry scabbed area where one previously occurred.  No open wound or drainage, dry and scabbed. Right breast fold with 2 healing full thickness wounds; .2X.2X.2cm and .3X.2X.2cm; both are pink and moist, no odor, small amt yellow drainage.   Dressing procedure/placement/frequency: Foam dressings to protect and promote healing.  Recommend bathing with dial antibacterial soap.  Discussed plan of care with patient and she verbalized understanding. Please re-consult if further assistance is needed.  Thank-you,  Cammie Mcgee MSN, RN, CWOCN, [place], CNS [phone]

## 2016-11-27 NOTE — Care Management Note (Signed)
Case Management Note  Patient Details  Name: INFANT DOANE MRN: [id number] Date of Birth: Jun 10, 1960  Subjective/Objective:                    Action/Plan:  MATCH letter given dated 11-27-16 to 12-04-16 and explained.   Patient has scheduled an appointment at [facility] on Oct 10 , 2018 .   Patient asking for a machine to check her blood sugar. Advised patient she will have to purchase one . NCM has been told best deal for machine and test strips is  [employer] .  Patient voiced understanding to all of above. Expected Discharge Date:                  Expected Discharge Plan:  Home/Self Care  In-House Referral:     Discharge planning Services  CM Consult, Indigent Health Clinic, [facility], Medication Assistance  Post Acute Care Choice:    Choice offered to:  Patient  DME Arranged:    DME Agency:     HH Arranged:    HH Agency:     Status of Service:  Completed, signed off  If discussed at [employer], dates discussed:    Additional Comments:  Kingsley Plan, RN 11/27/2016, 2:13 PM

## 2016-11-27 NOTE — Progress Notes (Signed)
Pt left unit without telling anyone. Someone called from cafeteria and stated that pt doesn't feel like she can walk back could we bring a wc and wheel her back. Pt instructed to not leave unit again.

## 2016-11-27 NOTE — Progress Notes (Signed)
PROGRESS NOTE    PENNI PENADO  ZOX:[id number] DOB: [birth year] DOA: 11/24/2016 PCP: Patient, No Pcp Per    Brief Narrative:   56 y.o. female with history of diabetes mellitus type 2 has not been taking medications, hypertension, hypothyroidism and hyperlipidemia presents to the ER because she has been running elevated blood sugar last few days. Patient states her glucometer has not able to record the readings. In addition patient also has been noticing some breast abscess which has eventually started draining. Breast abscess present in both breasts.  Assessment & Plan:   Principal Problem:   Hyperosmolar non-ketotic state in patient with type 2 diabetes mellitus (HCC)/Uncontrolled type 2 diabetes mellitus with hyperglycemia (HCC)  - Continue sliding scale insulin, blood sugars still elevated will increase Lantus dosing to 32 units daily  Active Problems:   Hypertension - Stable currently off of antihypertensives    Hypothyroidism - Stable on Synthroid    COPD (chronic obstructive pulmonary disease) (HCC) - Currently compensated. Continue current medical regimen    Breast abscess -  Increasing blood sugars most likely - Will transition to oral antibiotics given improvement in condition  DVT prophylaxis: Early ambulation and SCD's Code Status: Full Family Communication: None at bedside Disposition Plan: Once blood sugars better controlled, still elevated.    Consultants:   None   Procedures: None  Antimicrobials: Zosyn and Vancomycin  Subjective: Still reporting elevated blood sugars. But reports improvement in infections at breast  Objective: Vitals:   11/26/16 1528 11/26/16 2039 11/27/16 0555 11/27/16 1544  BP: (!) 119/58 133/60 (!) 113/42 (!) 133/59  Pulse: 80 83 75 72  Resp: Temp: 98.2 F (36.8 C) 98.2 F (36.8 C) 98.6 F (37 C) 98.1 F (36.7 C)  TempSrc: Oral Oral Oral Oral  SpO2: 97% 97% 92% 100%  Weight:      Height:         Intake/Output Summary (Last 24 hours) at 11/27/16 1743 Last data filed at 11/27/16 1543  Gross per 24 hour  Intake             1894 ml  Output              500 ml  Net             1394 ml   Filed Weights   11/24/16 1000  Weight: 120.2 kg (264 lb 15.9 oz)    Examination:Exam unchanged when compared to 11/26/2016  General exam: Appears calm and comfortable , In no acute distress Respiratory system: Clear to auscultation. Respiratory effort normal. Cardiovascular system: S1 & S2 heard, RRR. No JVD, murmurs, rubs, gallops or clicks. Gastrointestinal system: Abdomen is nondistended, soft and nontender. No guarding Normal bowel sounds heard. Central nervous system: Alert and oriented. No focal neurological deficits. Extremities: Symmetric 5 x 5 power. Skin: Patient has ulcerations and some cellulitis at right lower quadrant of right breast. Psychiatry: Judgement and insight appear normal. Mood & affect appropriate.   Data Reviewed: I have personally reviewed following labs and imaging studies  CBC:  Recent Labs Lab 11/24/16 0054 11/24/16 0503 11/26/16 1641  WBC 8.0 6.4 5.7  NEUTROABS  --  3.8  --   HGB 13.8 12.9 12.8  HCT 41.5 38.5 38.6  MCV 90.0 89.7 90.6  PLT 254 192 207   Basic Metabolic Panel:  Recent Labs Lab 11/24/16 0054 11/24/16 0503 11/24/16 0847 11/24/16 1548 11/24/16 1929  NA 130* 133* 137 135 133*  K 4.4 3.3*  3.1* 4.0 3.5  CL 95* 98* 107 104 103  CO2 21* 18* 20* 21* 20*  GLUCOSE 602* 489* 170* 267* 412*  BUN 9 8 <5* <5* 5*  CREATININE 1.54* 1.36* 0.88 0.95 0.99  CALCIUM 9.8 9.0 8.6* 8.1* 8.3*   GFR: Estimated Creatinine Clearance: 83 mL/min (by C-G formula based on SCr of 0.99 mg/dL). Liver Function Tests:  Recent Labs Lab 11/24/16 0503  AST 18  ALT 19  ALKPHOS 140*  BILITOT 1.0  PROT 5.5*  ALBUMIN 2.9*   No results for input(s): LIPASE, AMYLASE in the last 168 hours. No results for input(s): AMMONIA in the last 168  hours. Coagulation Profile: No results for input(s): INR, PROTIME in the last 168 hours. Cardiac Enzymes:  Recent Labs Lab 11/24/16 0847  TROPONINI <0.03   BNP (last 3 results) No results for input(s): PROBNP in the last 8760 hours. HbA1C: No results for input(s): HGBA1C in the last 72 hours. CBG:  Recent Labs Lab 11/26/16 1652 11/26/16 2041 11/27/16 0800 11/27/16 1216 11/27/16 1715  GLUCAP 232* 309* 393* 291* 302*   Lipid Profile: No results for input(s): CHOL, HDL, LDLCALC, TRIG, CHOLHDL, LDLDIRECT in the last 72 hours. Thyroid Function Tests: No results for input(s): TSH, T4TOTAL, FREET4, T3FREE, THYROIDAB in the last 72 hours. Anemia Panel: No results for input(s): VITAMINB12, FOLATE, FERRITIN, TIBC, IRON, RETICCTPCT in the last 72 hours. Sepsis Labs:  Recent Labs Lab 11/24/16 0847 11/24/16 1548  LATICACIDVEN 1.4 0.8    Recent Results (from the past 240 hour(s))  Urine Culture     Status: Abnormal   Collection Time: 11/24/16 12:56 AM  Result Value Ref Range Status   Specimen Description Urine  Final   Special Requests NONE  Final   Culture MULTIPLE SPECIES PRESENT, SUGGEST RECOLLECTION (A)  Final   Report Status 11/25/2016 FINAL  Final  Wound or Superficial Culture     Status: None   Collection Time: 11/24/16  5:41 AM  Result Value Ref Range Status   Specimen Description WOUND BREAST  Final   Special Requests NONE  Final   Gram Stain   Final    ABUNDANT WBC PRESENT,BOTH PMN AND MONONUCLEAR ABUNDANT GRAM POSITIVE RODS FEW GRAM POSITIVE COCCI IN PAIRS    Culture NORMAL SKIN FLORA  Final   Report Status 11/27/2016 FINAL  Final  Culture, blood (routine x 2)     Status: None (Preliminary result)   Collection Time: 11/24/16  8:50 AM  Result Value Ref Range Status   Specimen Description BLOOD LEFT HAND  Final   Special Requests   Final    BOTTLES DRAWN AEROBIC AND ANAEROBIC Blood Culture adequate volume   Culture NO GROWTH 3 DAYS  Final   Report Status  PENDING  Incomplete  Culture, blood (routine x 2)     Status: None (Preliminary result)   Collection Time: 11/24/16  8:55 AM  Result Value Ref Range Status   Specimen Description BLOOD WRIST LEFT  Final   Special Requests IN PEDIATRIC BOTTLE Blood Culture adequate volume  Final   Culture NO GROWTH 3 DAYS  Final   Report Status PENDING  Incomplete      Radiology Studies: No results found.   Scheduled Meds: . atorvastatin  40 mg Oral Daily  . FLUoxetine  20 mg Oral Daily  . insulin aspart  0-20 Units Subcutaneous TID WC  . insulin aspart  0-5 Units Subcutaneous QHS  . [START ON 11/28/2016] insulin glargine  32 Units Subcutaneous Daily  .  levothyroxine  125 mcg Oral QAC breakfast  . living well with diabetes book   Does not apply Once  . loratadine  10 mg Oral Daily  . nystatin cream   Topical BID  . pantoprazole  40 mg Oral Daily  . sulfamethoxazole-trimethoprim  1 tablet Oral Q12H   Continuous Infusions:    LOS: 3 days    Time spent: > 35 minutes  Penny Pia, MD [facility] Pager [phone]  If 7PM-7AM, please contact night-coverage [email or web address] Password [email or web address] 11/27/2016, 5:43 PM

## 2016-11-28 LAB — GLUCOSE, CAPILLARY
GLUCOSE-CAPILLARY: 354 mg/dL — AB (ref 65–99)
GLUCOSE-CAPILLARY: 327 mg/dL — AB (ref 65–99)
Glucose-Capillary: 290 mg/dL — ABNORMAL HIGH (ref 65–99)
Glucose-Capillary: 248 mg/dL — ABNORMAL HIGH (ref 65–99)

## 2016-11-28 MED ORDER — INSULIN GLARGINE 100 UNIT/ML ~~LOC~~ SOLN
36.0000 [IU] | Freq: Every day | SUBCUTANEOUS | Status: DC
  Administered 2016-11-29: 36 [IU] via SUBCUTANEOUS
  Filled 2016-11-28: qty 0.36

## 2016-11-28 MED ORDER — INSULIN ASPART 100 UNIT/ML ~~LOC~~ SOLN
5.0000 [IU] | Freq: Three times a day (TID) | SUBCUTANEOUS | Status: DC
  Administered 2016-11-29 (×2): 5 [IU] via SUBCUTANEOUS

## 2016-11-28 NOTE — Progress Notes (Signed)
Pt wants to go home I paged Dr. Cena Benton and he said he still monitoring the CBG trend is 300's, pt aware that she is not going home today.

## 2016-11-28 NOTE — Progress Notes (Signed)
Inpatient Diabetes Program Recommendations  AACE/ADA: New Consensus Statement on Inpatient Glycemic Control (2015)  Target Ranges:  Prepandial:   less than 140 mg/dL      Peak postprandial:   less than 180 mg/dL (1-2 hours)      Critically ill patients:  140 - 180 mg/dL   Lab Results  Component Value Date   GLUCAP 290 (H) 11/28/2016   HGBA1C >15.5 (H) 11/24/2016    Review of Glycemic Control Blood sugars > 180 mg/dL. Needs insulin adjustment. Post-prandials elevated.  Inpatient Diabetes Program Recommendations:   Increase Lantus to 36 units QD Add Novolog 6 units tidwc for meal coverage insulin Will f/u with [facility].  Continue with bedside RN teaching insulin administration.  Follow.  Thank you. Ailene Ards, RD, LDN, CDE Inpatient Diabetes Coordinator [phone]

## 2016-11-28 NOTE — Progress Notes (Signed)
PROGRESS NOTE    [name]  BJY:[id number] DOB: 07-08-1960 DOA: 11/24/2016 PCP: Patient, No Pcp Per    Brief Narrative:   56 y.o. female with history of diabetes mellitus type 2 has not been taking medications, hypertension, hypothyroidism and hyperlipidemia presents to the ER because she has been running elevated blood sugar last few days. Patient states her glucometer has not able to record the readings. In addition patient also has been noticing some breast abscess which has eventually started draining. Breast abscess (currently draining) present in both breasts.  Assessment & Plan:   Principal Problem:   Hyperosmolar non-ketotic state in patient with type 2 diabetes mellitus (HCC)/Uncontrolled type 2 diabetes mellitus with hyperglycemia (HCC)  - Continue sliding scale insulin, blood sugars still elevated will increase Lantus dosing to 36 units daily and place on novolog 5 units TID with meals as recommended by diabetic coordinator.  - Blood sugars still in the 300's and given that patient is new diabetic I chose to keep her longer until her insulin regimen kept her blood sugars consistently below 300.   Active Problems:   Hypertension - Stable currently off of antihypertensives    Hypothyroidism - Stable on Synthroid    COPD (chronic obstructive pulmonary disease) (HCC) - Currently compensated. Continue current medical regimen    Breast abscess -  Increasing blood sugars most likely - Tolerating oral antibiotic regimen.  DVT prophylaxis: Early ambulation and SCD's Code Status: Full Family Communication: None at bedside Disposition Plan: Once blood sugars better controlled, still elevated.    Consultants:   None   Procedures: None  Antimicrobials: Zosyn and Vancomycin  Subjective: Pt has no new complaints.  Objective: Vitals:   11/27/16 1544 11/27/16 2124 11/28/16 0557 11/28/16 1424  BP: (!) 133/59 132/81 (!) 112/59 (!) 107/57  Pulse: 72 81 85 80  Resp: Temp: 98.1 F (36.7 C) 98.4 F (36.9 C) 98.4 F (36.9 C) 98.5 F (36.9 C)  TempSrc: Oral Oral Oral Oral  SpO2: 100% 100% 96% 96%  Weight:      Height:        Intake/Output Summary (Last 24 hours) at 11/28/16 2111 Last data filed at 11/28/16 1941  Gross per 24 hour  Intake             1520 ml  Output              500 ml  Net             1020 ml   Filed Weights   11/24/16 1000  Weight: 120.2 kg (264 lb 15.9 oz)    Examination:Exam unchanged when compared to 11/27/16  General exam: Appears calm and comfortable , In no acute distress Respiratory system: Clear to auscultation. Respiratory effort normal. Cardiovascular system: S1 & S2 heard, RRR. No JVD, murmurs, rubs, gallops or clicks. Gastrointestinal system: Abdomen is nondistended, soft and nontender. No guarding Normal bowel sounds heard. Central nervous system: Alert and oriented. No focal neurological deficits. Extremities: Symmetric 5 x 5 power. Skin: Patient has ulcerations and some cellulitis at right lower quadrant of right breast. Psychiatry: Judgement and insight appear normal. Mood & affect appropriate.   Data Reviewed: I have personally reviewed following labs and imaging studies  CBC:  Recent Labs Lab 11/24/16 0054 11/24/16 0503 11/26/16 1641  WBC 8.0 6.4 5.7  NEUTROABS  --  3.8  --   HGB 13.8 12.9 12.8  HCT 41.5 38.5 38.6  MCV 90.0  89.7 90.6  PLT 254 192 207   Basic Metabolic Panel:  Recent Labs Lab 11/24/16 0054 11/24/16 0503 11/24/16 0847 11/24/16 1548 11/24/16 1929  NA 130* 133* 137 135 133*  K 4.4 3.3* 3.1* 4.0 3.5  CL 95* 98* 107 104 103  CO2 21* 18* 20* 21* 20*  GLUCOSE 602* 489* 170* 267* 412*  BUN 9 8 <5* <5* 5*  CREATININE 1.54* 1.36* 0.88 0.95 0.99  CALCIUM 9.8 9.0 8.6* 8.1* 8.3*   GFR: Estimated Creatinine Clearance: 83 mL/min (by C-G formula based on SCr of 0.99 mg/dL). Liver Function Tests:  Recent Labs Lab 11/24/16 0503  AST 18  ALT 19  ALKPHOS 140*    BILITOT 1.0  PROT 5.5*  ALBUMIN 2.9*   No results for input(s): LIPASE, AMYLASE in the last 168 hours. No results for input(s): AMMONIA in the last 168 hours. Coagulation Profile: No results for input(s): INR, PROTIME in the last 168 hours. Cardiac Enzymes:  Recent Labs Lab 11/24/16 0847  TROPONINI <0.03   BNP (last 3 results) No results for input(s): PROBNP in the last 8760 hours. HbA1C: No results for input(s): HGBA1C in the last 72 hours. CBG:  Recent Labs Lab 11/27/16 1715 11/27/16 2122 11/28/16 0744 11/28/16 1219 11/28/16 1639  GLUCAP 302* 297* 354* 327* 290*   Lipid Profile: No results for input(s): CHOL, HDL, LDLCALC, TRIG, CHOLHDL, LDLDIRECT in the last 72 hours. Thyroid Function Tests: No results for input(s): TSH, T4TOTAL, FREET4, T3FREE, THYROIDAB in the last 72 hours. Anemia Panel: No results for input(s): VITAMINB12, FOLATE, FERRITIN, TIBC, IRON, RETICCTPCT in the last 72 hours. Sepsis Labs:  Recent Labs Lab 11/24/16 0847 11/24/16 1548  LATICACIDVEN 1.4 0.8    Recent Results (from the past 240 hour(s))  Urine Culture     Status: Abnormal   Collection Time: 11/24/16 12:56 AM  Result Value Ref Range Status   Specimen Description Urine  Final   Special Requests NONE  Final   Culture MULTIPLE SPECIES PRESENT, SUGGEST RECOLLECTION (A)  Final   Report Status 11/25/2016 FINAL  Final  Wound or Superficial Culture     Status: None   Collection Time: 11/24/16  5:41 AM  Result Value Ref Range Status   Specimen Description WOUND BREAST  Final   Special Requests NONE  Final   Gram Stain   Final    ABUNDANT WBC PRESENT,BOTH PMN AND MONONUCLEAR ABUNDANT GRAM POSITIVE RODS FEW GRAM POSITIVE COCCI IN PAIRS    Culture NORMAL SKIN FLORA  Final   Report Status 11/27/2016 FINAL  Final  Culture, blood (routine x 2)     Status: None (Preliminary result)   Collection Time: 11/24/16  8:50 AM  Result Value Ref Range Status   Specimen Description BLOOD LEFT  HAND  Final   Special Requests   Final    BOTTLES DRAWN AEROBIC AND ANAEROBIC Blood Culture adequate volume   Culture NO GROWTH 4 DAYS  Final   Report Status PENDING  Incomplete  Culture, blood (routine x 2)     Status: None (Preliminary result)   Collection Time: 11/24/16  8:55 AM  Result Value Ref Range Status   Specimen Description BLOOD WRIST LEFT  Final   Special Requests IN PEDIATRIC BOTTLE Blood Culture adequate volume  Final   Culture NO GROWTH 4 DAYS  Final   Report Status PENDING  Incomplete      Radiology Studies: No results found.   Scheduled Meds: . atorvastatin  40 mg Oral Daily  .  FLUoxetine  20 mg Oral Daily  . insulin aspart  0-20 Units Subcutaneous TID WC  . insulin aspart  0-5 Units Subcutaneous QHS  . insulin glargine  32 Units Subcutaneous Daily  . levothyroxine  125 mcg Oral QAC breakfast  . living well with diabetes book   Does not apply Once  . loratadine  10 mg Oral Daily  . nystatin cream   Topical BID  . pantoprazole  40 mg Oral Daily  . sulfamethoxazole-trimethoprim  1 tablet Oral Q12H   Continuous Infusions:    LOS: 4 days   Time spent: > 35 minutes  Penny Pia, MD [facility] Pager [phone]  If 7PM-7AM, please contact night-coverage [email or web address] Password [email or web address] 11/28/2016, 9:11 PM

## 2016-11-29 LAB — CULTURE, BLOOD (ROUTINE X 2)
CULTURE: NO GROWTH
Culture: NO GROWTH
SPECIAL REQUESTS: ADEQUATE
SPECIAL REQUESTS: ADEQUATE

## 2016-11-29 LAB — GLUCOSE, CAPILLARY
Glucose-Capillary: 255 mg/dL — ABNORMAL HIGH (ref 65–99)
Glucose-Capillary: 219 mg/dL — ABNORMAL HIGH (ref 65–99)

## 2016-11-29 MED ORDER — HYDROCHLOROTHIAZIDE 25 MG PO TABS: 25.0000 mg | ORAL_TABLET | Freq: Every day | ORAL | Status: DC

## 2016-11-29 MED ORDER — INSULIN ASPART 100 UNIT/ML ~~LOC~~ SOLN: 5.0000 [IU] | Freq: Three times a day (TID) | SUBCUTANEOUS | 0 refills | Status: DC

## 2016-11-29 MED ORDER — SULFAMETHOXAZOLE-TRIMETHOPRIM 800-160 MG PO TABS: 1.0000 | ORAL_TABLET | Freq: Two times a day (BID) | ORAL | 0 refills | Status: DC

## 2016-11-29 MED ORDER — INSULIN GLARGINE 100 UNIT/ML ~~LOC~~ SOLN: 36.0000 [IU] | Freq: Every day | SUBCUTANEOUS | 2 refills | Status: DC

## 2016-11-29 MED ORDER — BLOOD GLUCOSE METER KIT: PACK | 0 refills | Status: AC

## 2016-11-29 MED ORDER — FLUCONAZOLE 100 MG PO TABS
100.0000 mg | ORAL_TABLET | Freq: Once | ORAL | Status: DC
Start: 2016-11-29 — End: 2016-11-29
  Filled 2016-11-29: qty 1

## 2016-11-29 NOTE — Progress Notes (Signed)
Pt discharged home in stable condition after going over discharge instructions with no concerns voiced. AVS and discharge scripts given before leaving unit 

## 2016-11-29 NOTE — Discharge Summary (Signed)
Physician Discharge Summary  ADY HEIMANN IPP:[id number] DOB: [birth year] DOA: 11/24/2016  PCP: Patient, No Pcp Per  Admit date: 11/24/2016 Discharge date: 11/29/2016  Time spent: 35 minutes  Recommendations for Outpatient Follow-up:  1. 10/10 at [facility]   Discharge Diagnoses:  Principal Problem:   Hyperosmolar non-ketotic state in patient with type 2 diabetes mellitus ([place]) Active Problems:   Hypertension   Hypothyroidism   COPD (chronic obstructive pulmonary disease) ([place])   Breast cellulitis   Uncontrolled type 2 diabetes mellitus with hyperglycemia ([place])   DKA (diabetic ketoacidoses) ([place])   Discharge Condition: stable  Diet recommendation: diabetic  Filed Weights   11/24/16 1000  Weight: 120.2 kg (264 lb 15.9 oz)    History of present illness:  56 y.o.femalewith history of diabetes mellitus type 2 has not been taking medications, hypertension, hypothyroidism and hyperlipidemia presents to the ER because she has been running elevated blood sugar last few days. Patient states her glucometer has not able to record the readings. In addition patient also has been noticing some breast abscess which has eventually started draining. Breast abscess (currently draining) present in both breasts.   Hospital Course:  Hyperosmolar non-ketotic state in patient with type 2 diabetes mellitus (HCC)/Uncontrolled type 2 diabetes mellitus with hyperglycemia  - admitted with CBG>600 - started on Insulin gtt initially and then switched to lantus and novolog sliding scale - Hba1c was 15.5 -insulin teaching completed -she was provided with [facility] letter, med assistance and we made a FU for pt at [facility] on 10/10  Breast cellulitis and small boils -she had a small boil on the inframammary aspect of her R breast which was spontaneously draining and had mild surrounding erythema on admission, she was treated with IV Abx for this and has this completely resolved -Abx  were switched to pill form by Dr.Vega who saw her yesterday, since she has no active evidence of cellulitis or abscess at this time today when I saw her, I discharged her on 56moe days of Bactrim and advised FU with Surgery if there are any problems in this location, also advised pt regarding need for mammogram in a few weeks to months time   Hypothyroidism - Stable on Synthroid   COPD [place]ch[place] obstructive [place] disease) [place] - Currently compensated. Continue current medical regimen  Consultations:  DM coordinator  Discharge Exam: Vitals:   11/28/16 2136 11/29/16 0448  BP: (!) 131/96 (!) 116/91  Pulse: 87 89  Resp: 20 18  Temp: 98.7 F (37.1 C) 99.3 F (37.4 C)  SpO2: 99% 93%    General: AAOx3 Cardiovascular: S1S2/RRR Respiratory: CTAB  Discharge Instructions   Discharge Instructions    Diet Carb Modified    Complete by:  As directed    Increase activity slowly    Complete by:  As directed      Discharge Medication List as of 11/29/2016 12:04 PM    START taking these medications   Details  blood glucose meter kit and supplies Dispense based on patient and insurance preference. Use up to four times daily as directed. (FOR ICD-9 250.00, 250.01)., Print    insulin aspart (NOVOLOG) 100 UNIT/ML injection Inject 5 Units into the skin 3 (three) times daily with meals., Starting Wed 11/29/2016, Print    insulin glargine (LANTUS) 100 UNIT/ML injection Inject 0.36 mLs (36 Units total) into the skin daily., Starting Thu 11/30/2016, Print    sulfamethoxazole-trimethoprim (BACTRIM DS,SEPTRA DS) 800-160 MG tablet Take 1 tablet by mouth every 12 (twelve)  hours. For 2days, Starting Wed 11/29/2016, Print      CONTINUE these medications which have CHANGED   Details  hydrochlorothiazide (HYDRODIURIL) 25 MG tablet Take 1 tablet (25 mg total) by mouth daily. 1/2 tab by mouth daily, Starting Wed 11/29/2016, No Print      CONTINUE these medications which have NOT CHANGED   Details   acetaminophen (TYLENOL) 500 MG tablet Take 1 tablet (500 mg total) by mouth every 8 (eight) hours., Starting Tue 10/31/2016, Print    albuterol (PROVENTIL HFA;VENTOLIN HFA) 108 (90 Base) MCG/ACT inhaler Inhale 2 puffs into the lungs every 6 (six) hours as needed for wheezing or shortness of breath., Starting Mon 08/14/2016, Print    atorvastatin (LIPITOR) 40 MG tablet Take 40 mg by mouth daily., Historical Med    cetirizine (ZYRTEC) 10 MG tablet Take 1 tablet (10 mg total) by mouth daily., Starting Mon 08/14/2016, Print    cyclobenzaprine (FLEXERIL) 10 MG tablet 1/2 to 1 tab by mouth at bedtime as needed for muscle spasm/pain, Print    diclofenac sodium (VOLTAREN) 1 % GEL Apply 2-4 grams topically to the right knee 3-4 times daily as needed for pain., Print    FLUoxetine (PROZAC) 20 MG capsule Take 20 mg by mouth daily., Historical Med    hydrocortisone (ANUSOL-HC) 25 MG suppository Place 1 suppository (25 mg total) rectally 2 (two) times daily., Starting Tue 10/31/2016, Print    levothyroxine (SYNTHROID, LEVOTHROID) 125 MCG tablet Take 125 mcg by mouth daily before breakfast., Historical Med    lidocaine (LIDODERM) 5 % Place 1 patch onto the skin daily. Remove & Discard patch within 12 hours or as directed by MD, Starting Tue 10/31/2016, Print    mometasone (ASMANEX 60 METERED DOSES) 220 MCG/INH inhaler Inhale 2 puffs into the lungs daily., Starting Mon 08/14/2016, Print    mometasone (NASONEX) 50 MCG/ACT nasal spray Place 2 sprays into the nose daily., Starting Mon 08/14/2016, Print    omeprazole (PRILOSEC) 20 MG capsule Take 20 mg by mouth daily., Historical Med    traMADol (ULTRAM) 50 MG tablet Take 1 tablet (50 mg total) by mouth every 6 (six) hours as needed., Starting Mon 10/09/2016, Print       Allergies  Allergen Reactions  . Tape Rash      The results of significant diagnostics from this hospitalization (including imaging, microbiology, ancillary and laboratory) are listed below  for reference.    Significant Diagnostic Studies: No results found.  Microbiology: Recent Results (from the past 240 hour(s))  Urine Culture     Status: Abnormal   Collection Time: 11/24/16 12:56 AM  Result Value Ref Range Status   Specimen Description Urine  Final   Special Requests NONE  Final   Culture MULTIPLE SPECIES PRESENT, SUGGEST RECOLLECTION (A)  Final   Report Status 11/25/2016 FINAL  Final  Wound or Superficial Culture     Status: None   Collection Time: 11/24/16  5:41 AM  Result Value Ref Range Status   Specimen Description WOUND BREAST  Final   Special Requests NONE  Final   Gram Stain   Final    ABUNDANT WBC PRESENT,BOTH PMN AND MONONUCLEAR ABUNDANT GRAM POSITIVE RODS FEW GRAM POSITIVE COCCI IN PAIRS    Culture NORMAL SKIN FLORA  Final   Report Status 11/27/2016 FINAL  Final  Culture, blood (routine x 2)     Status: None   Collection Time: 11/24/16  8:50 AM  Result Value Ref Range Status   Specimen Description BLOOD  LEFT HAND  Final   Special Requests   Final    BOTTLES DRAWN AEROBIC AND ANAEROBIC Blood Culture adequate volume   Culture NO GROWTH 5 DAYS  Final   Report Status 11/29/2016 FINAL  Final  Culture, blood (routine x 2)     Status: None   Collection Time: 11/24/16  8:55 AM  Result Value Ref Range Status   Specimen Description BLOOD WRIST LEFT  Final   Special Requests IN PEDIATRIC BOTTLE Blood Culture adequate volume  Final   Culture NO GROWTH 5 DAYS  Final   Report Status 11/29/2016 FINAL  Final     Labs: Basic Metabolic Panel:  Recent Labs Lab 11/24/16 0054 11/24/16 0503 11/24/16 0847 11/24/16 1548 11/24/16 1929  NA 130* 133* 137 135 133*  K 4.4 3.3* 3.1* 4.0 3.5  CL 95* 98* 107 104 103  CO2 21* 18* 20* 21* 20*  GLUCOSE 602* 489* 170* 267* 412*  BUN 9 8 <5* <5* 5*  CREATININE 1.54* 1.36* 0.88 0.95 0.99  CALCIUM 9.8 9.0 8.6* 8.1* 8.3*   Liver Function Tests:  Recent Labs Lab 11/24/16 0503  AST 18  ALT 19  ALKPHOS 140*   BILITOT 1.0  PROT 5.5*  ALBUMIN 2.9*   No results for input(s): LIPASE, AMYLASE in the last 168 hours. No results for input(s): AMMONIA in the last 168 hours. CBC:  Recent Labs Lab 11/24/16 0054 11/24/16 0503 11/26/16 1641  WBC 8.0 6.4 5.7  NEUTROABS  --  3.8  --   HGB 13.8 12.9 12.8  HCT 41.5 38.5 38.6  MCV 90.0 89.7 90.6  PLT 254 192 207   Cardiac Enzymes:  Recent Labs Lab 11/24/16 0847  TROPONINI <0.03   BNP: BNP (last 3 results) No results for input(s): BNP in the last 8760 hours.  ProBNP (last 3 results) No results for input(s): PROBNP in the last 8760 hours.  CBG:  Recent Labs Lab 11/28/16 1219 11/28/16 1639 11/28/16 2132 11/29/16 0747 11/29/16 1221  GLUCAP 327* 290* 248* 255* 219*       SignedDomenic Polite MD.  [facility] 11/29/2016, 4:28 PM

## 2016-11-30 ENCOUNTER — Telehealth: Payer: Self-pay | Admitting: *Deleted

## 2016-11-30 MED FILL — !TRUE METRIX BLOOD GLUCOSE: 1 days supply | Qty: 1 | Fill #0

## 2016-11-30 MED FILL — TRUEplus LANCETS 28G MISC: 25 days supply | Qty: 100 | Fill #0

## 2016-11-30 MED FILL — LANTUS 100 UNITS/ML VIAL: 100 | 27 days supply | Qty: 10 | Fill #0

## 2016-11-30 MED FILL — TRUE METRIX GLUCOSE TEST ST: 25 days supply | Qty: 100 | Fill #0

## 2016-11-30 MED FILL — SULFAMETHOXAZOLE-TMP DS TAB: 800-160 | 2 days supply | Qty: 4 | Fill #0

## 2016-11-30 MED FILL — NovoLOG 100 UNIT/ML SOLN: 100 | 30 days supply | Qty: 10 | Fill #0

## 2016-11-30 NOTE — Telephone Encounter (Signed)
Pt states she was prescribed syringes for insulin injections on yesterday. Reviewed medications: insulin regimen. Pt was given syringes to administered insulin as directed.

## 2016-12-08 ENCOUNTER — Inpatient Hospital Stay: Payer: Self-pay

## 2016-12-11 ENCOUNTER — Telehealth: Payer: Self-pay

## 2016-12-11 NOTE — Telephone Encounter (Signed)
Pt came into the office today to request medication refill. Pt is requesting a 90 day supply. Pt is also requesting syringes and needles for her insulin as well. I asked Misty Stanley if she would be able to refill them but unfortunately because she has not seen anyone in the office she will not be able to fill it. Pt had an appointment schedule for 10-12 but had to be rescheduled due to [place] not being in the office. Would you be able to refill all her mediations if possible. If you are able pt would like rx's sent to [facility]. Medication Assistance Program. Pt states she doesn't have enough medicine to last till her appointment. Please f/u

## 2016-12-12 NOTE — Telephone Encounter (Signed)
Unfortunately I would be unable to give her a 90 day supply of medication without being seen in this office. I have reviewed her med list and she does have some refills which she should take until her upcoming appointment.

## 2016-12-13 NOTE — Telephone Encounter (Signed)
Contacted pt to make aware that medications would not be able to refilled and that her medications should last her till appointment pt didn't answer and was unable to lvm

## 2016-12-20 ENCOUNTER — Ambulatory Visit: Payer: Medicaid Other | Attending: Internal Medicine | Admitting: Physician Assistant

## 2016-12-20 ENCOUNTER — Encounter: Payer: Self-pay | Admitting: Physician Assistant

## 2016-12-20 VITALS — BP 148/83 | HR 79 | Temp 98.5°F | Resp 16 | Wt 251.2 lb

## 2016-12-20 DIAGNOSIS — K219 Gastro-esophageal reflux disease without esophagitis: Secondary | ICD-10-CM | POA: Insufficient documentation

## 2016-12-20 DIAGNOSIS — G4733 Obstructive sleep apnea (adult) (pediatric): Secondary | ICD-10-CM | POA: Insufficient documentation

## 2016-12-20 DIAGNOSIS — Z794 Long term (current) use of insulin: Secondary | ICD-10-CM | POA: Insufficient documentation

## 2016-12-20 DIAGNOSIS — J449 Chronic obstructive pulmonary disease, unspecified: Secondary | ICD-10-CM | POA: Insufficient documentation

## 2016-12-20 DIAGNOSIS — N61 Mastitis without abscess: Secondary | ICD-10-CM | POA: Insufficient documentation

## 2016-12-20 DIAGNOSIS — M25561 Pain in right knee: Secondary | ICD-10-CM | POA: Diagnosis not present

## 2016-12-20 DIAGNOSIS — F17200 Nicotine dependence, unspecified, uncomplicated: Secondary | ICD-10-CM | POA: Insufficient documentation

## 2016-12-20 DIAGNOSIS — E785 Hyperlipidemia, unspecified: Secondary | ICD-10-CM | POA: Insufficient documentation

## 2016-12-20 DIAGNOSIS — Z79899 Other long term (current) drug therapy: Secondary | ICD-10-CM | POA: Diagnosis not present

## 2016-12-20 DIAGNOSIS — M5416 Radiculopathy, lumbar region: Secondary | ICD-10-CM | POA: Insufficient documentation

## 2016-12-20 DIAGNOSIS — E039 Hypothyroidism, unspecified: Secondary | ICD-10-CM | POA: Insufficient documentation

## 2016-12-20 DIAGNOSIS — I10 Essential (primary) hypertension: Secondary | ICD-10-CM

## 2016-12-20 DIAGNOSIS — Z09 Encounter for follow-up examination after completed treatment for conditions other than malignant neoplasm: Secondary | ICD-10-CM

## 2016-12-20 DIAGNOSIS — E1165 Type 2 diabetes mellitus with hyperglycemia: Secondary | ICD-10-CM | POA: Diagnosis not present

## 2016-12-20 DIAGNOSIS — I509 Heart failure, unspecified: Secondary | ICD-10-CM | POA: Diagnosis not present

## 2016-12-20 DIAGNOSIS — I110 Hypertensive heart disease with heart failure: Secondary | ICD-10-CM | POA: Diagnosis not present

## 2016-12-20 DIAGNOSIS — G8929 Other chronic pain: Secondary | ICD-10-CM | POA: Diagnosis not present

## 2016-12-20 LAB — GLUCOSE, POCT (MANUAL RESULT ENTRY): POC Glucose: 180 mg/dl — AB (ref 70–99)

## 2016-12-20 MED ORDER — FLUOXETINE HCL 20 MG PO CAPS: 20.0000 mg | ORAL_CAPSULE | Freq: Every day | ORAL | 3 refills | Status: AC

## 2016-12-20 MED ORDER — OXYCODONE-ACETAMINOPHEN 5-325 MG PO TABS: 1.0000 | ORAL_TABLET | Freq: Three times a day (TID) | ORAL | 0 refills | Status: DC | PRN

## 2016-12-20 MED ORDER — ATORVASTATIN CALCIUM 40 MG PO TABS: 40.0000 mg | ORAL_TABLET | Freq: Every day | ORAL | 3 refills | Status: DC

## 2016-12-20 MED ORDER — INSULIN ASPART 100 UNIT/ML ~~LOC~~ SOLN: 5.0000 [IU] | Freq: Three times a day (TID) | SUBCUTANEOUS | 3 refills | Status: DC

## 2016-12-20 MED ORDER — "INSULIN SYRINGE-NEEDLE U-100 30G X 5/16"" 0.5 ML MISC": 12 refills | Status: DC

## 2016-12-20 MED ORDER — TRUEPLUS LANCETS 28G MISC: 12 refills | Status: DC

## 2016-12-20 MED ORDER — ALBUTEROL SULFATE HFA 108 (90 BASE) MCG/ACT IN AERS: 2.0000 | INHALATION_SPRAY | Freq: Four times a day (QID) | RESPIRATORY_TRACT | 3 refills | Status: DC | PRN

## 2016-12-20 MED ORDER — LEVOTHYROXINE SODIUM 125 MCG PO TABS: 125.0000 ug | ORAL_TABLET | Freq: Every day | ORAL | 3 refills | Status: DC

## 2016-12-20 MED ORDER — HYDROCHLOROTHIAZIDE 25 MG PO TABS: 25.0000 mg | ORAL_TABLET | Freq: Every day | ORAL | 3 refills | Status: DC

## 2016-12-20 MED ORDER — DICLOFENAC SODIUM 1 % TD GEL: TRANSDERMAL | 3 refills | Status: DC

## 2016-12-20 MED ORDER — OMEPRAZOLE 20 MG PO CPDR: 20.0000 mg | DELAYED_RELEASE_CAPSULE | Freq: Every day | ORAL | 3 refills | Status: DC

## 2016-12-20 MED ORDER — INSULIN GLARGINE 100 UNIT/ML ~~LOC~~ SOLN: 36.0000 [IU] | Freq: Every day | SUBCUTANEOUS | 3 refills | Status: DC

## 2016-12-20 MED ORDER — MOMETASONE FUROATE 220 MCG/INH IN AEPB: 2.0000 | INHALATION_SPRAY | Freq: Every day | RESPIRATORY_TRACT | 3 refills | Status: DC

## 2016-12-20 MED FILL — ?OMEPRAZOLE DR 20 MG CAPSUL: 20 | 30 days supply | Qty: 30 | Fill #0

## 2016-12-20 MED FILL — !LANTUS 100 UNITS/ML VIAL: 100 | 27 days supply | Qty: 10 | Fill #0

## 2016-12-20 MED FILL — ?ATORVASTATIN 40MG TABLET: 40 | 30 days supply | Qty: 30 | Fill #0

## 2016-12-20 MED FILL — HYDROCHLOROTHIAZIDE 25 MG T: 25 | 30 days supply | Qty: 15 | Fill #0

## 2016-12-20 MED FILL — VOLTAREN 1% GEL: 1 | 6 days supply | Qty: 100 | Fill #0

## 2016-12-20 MED FILL — TRUEPLUS SYR 0.5ML 30GX5/16: 30G X 5/16" | 30 days supply | Qty: 100 | Fill #0

## 2016-12-20 MED FILL — ?FLUOXETINE HCL 20 MG CAP: 20 | 30 days supply | Qty: 30 | Fill #0

## 2016-12-20 MED FILL — !NOVOLOG 100UNITS/ML VIAL: 100/ML | 28 days supply | Qty: 10 | Fill #0

## 2016-12-20 MED FILL — !VENTOLIN HFA INHALER: 108 (90 BAS | 25 days supply | Qty: 18 | Fill #0

## 2016-12-20 MED FILL — LEVOTHYROXINE 125 MCG TAB: 125 | 30 days supply | Qty: 30 | Fill #0

## 2016-12-20 NOTE — Addendum Note (Signed)
Addended by: Particia LatherPOLLOCK, JAY'A R on: 12/20/2016 02:33 PM   Modules accepted: Orders

## 2016-12-20 NOTE — Addendum Note (Signed)
Addended by: Carolynne EdouardPOLLOCK, JAY'A R on: 12/20/2016 02:40 PM   Modules accepted: Orders
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
## 2016-12-20 NOTE — Patient Instructions (Addendum)
Aim for 30 minutes of exercise most days. Rethink what you drink. Water is great! Aim for 2-3 Carb Choices per meal (30-45 grams) +/- 1 either way  Aim for 0-15 Carbs per snack if hungry  Include protein in moderation with your meals and snacks  Consider reading food labels for Total Carbohydrate and Fat Grams of foods  Consider checking BG at alternate times per day  Continue taking medication as directed Be mindful about how much sugar you are adding to beverages and other foods. Fruit Punch - find one with no sugar  Measure and decrease portions of carbohydrate foods  Make your plate and don't go back for seconds  Check sugar 3-4 times per day and bring a log to next appointment  Work on smoking cessation

## 2016-12-22 LAB — BASIC METABOLIC PANEL
BUN / CREAT RATIO: 6 — AB (ref 9–23)
BUN: 5 mg/dL — AB (ref 6–24)
CO2: 27 mmol/L (ref 20–29)
CREATININE: 0.9 mg/dL (ref 0.57–1.00)
Calcium: 9.5 mg/dL (ref 8.7–10.2)
Chloride: 99 mmol/L (ref 96–106)
GFR calc Af Amer: 84 mL/min/{1.73_m2} (ref >59)
GFR, EST NON AFRICAN AMERICAN: 73 mL/min/{1.73_m2} (ref >59)
GLUCOSE: 143 mg/dL — AB (ref 65–99)
Potassium: 3.5 mmol/L (ref 3.5–5.2)
Sodium: 144 mmol/L (ref 134–144)

## 2016-12-22 MED FILL — OXYCODONE W/APAP 5/325 TAB: 5-325 | 7 days supply | Qty: 20 | Fill #0

## 2016-12-27 ENCOUNTER — Ambulatory Visit: Payer: Medicaid Other | Attending: Internal Medicine

## 2017-01-10 MED FILL — OXYCODONE-ACETAMINOPHEN 5-3: 5-325 | 5 days supply | Qty: 20 | Fill #0

## 2017-01-30 ENCOUNTER — Ambulatory Visit: Payer: Self-pay | Admitting: Internal Medicine

## 2017-04-09 ENCOUNTER — Ambulatory Visit (INDEPENDENT_AMBULATORY_CARE_PROVIDER_SITE_OTHER): Payer: Medicaid Other | Admitting: Orthopedic Surgery

## 2017-04-09 ENCOUNTER — Encounter (INDEPENDENT_AMBULATORY_CARE_PROVIDER_SITE_OTHER): Payer: Self-pay | Admitting: Orthopedic Surgery

## 2017-04-09 ENCOUNTER — Encounter (INDEPENDENT_AMBULATORY_CARE_PROVIDER_SITE_OTHER): Payer: Self-pay

## 2017-04-09 VITALS — Ht 64.0 in | Wt 251.0 lb

## 2017-04-09 DIAGNOSIS — M25569 Pain in unspecified knee: Secondary | ICD-10-CM

## 2017-04-09 NOTE — Progress Notes (Signed)
Left without being seen.

## 2017-04-13 ENCOUNTER — Ambulatory Visit: Payer: Self-pay | Admitting: Podiatry

## 2017-04-17 ENCOUNTER — Ambulatory Visit: Payer: Self-pay | Admitting: Podiatry

## 2017-04-18 ENCOUNTER — Encounter: Payer: Self-pay | Admitting: Family Medicine

## 2017-04-18 ENCOUNTER — Ambulatory Visit (INDEPENDENT_AMBULATORY_CARE_PROVIDER_SITE_OTHER): Payer: Medicaid Other | Admitting: Family Medicine

## 2017-04-18 VITALS — BP 138/69 | Ht 64.0 in | Wt 250.0 lb

## 2017-04-18 DIAGNOSIS — M1711 Unilateral primary osteoarthritis, right knee: Secondary | ICD-10-CM

## 2017-04-18 MED ORDER — MELOXICAM 15 MG PO TABS: 15.0000 mg | ORAL_TABLET | Freq: Every day | ORAL | 1 refills | Status: DC

## 2017-04-18 MED ORDER — METHYLPREDNISOLONE ACETATE 40 MG/ML IJ SUSP
40.0000 mg | Freq: Once | INTRAMUSCULAR | Status: AC
  Administered 2017-04-18: 40 mg via INTRA_ARTICULAR

## 2017-04-18 NOTE — Progress Notes (Signed)
Chief complaint: Right knee pain x 1 year  History of present illness: [name] is a 57 year old female who presents to the sports medicine office today with chief complaint of right knee pain.  She reports that symptoms have been present for approximately 1 year.  She reports of no specific inciting incident, trauma, or injury to explain the pain.  She points to the anterior aspect of her right knee as to where she feels the pain.  She also reports feeling pain along the medial and lateral joint line.  She reports of any type of prolonged standing, walking, bending, or squatting causes pain.  She reports rest is only alleviating factor.  She reports that she has tried Tylenol, ibuprofen, Motrin, tramadol, and Percocet, with Percocet bringing only slight relief symptoms.  She has not tried any injection therapy, bracing, or physical therapy.  She reports that symptoms do wake her up at nighttime.  She reports symptoms have been gradually worsening over the last year.  She reports pain is throbbing, occasionally sharp and stabbing. Today, she reports pain is 8/10.  She does no report of any radiation of pain.  She reports of occasional swelling.  She also reports of having popping, locking, catching, and symptoms of giving way.  She does not report of any numbness, tingling, or burning paresthesias in her right leg.  She does ambulate with a cane in her right hand.  She does not report of any previous knee injury. She does not report of any right hip pain.  She does not report of any fevers, chills, night sweats, or any unintentional weight loss.  She reports that she did have a recent x-ray that was done 03/21/17.  She does bring a disc today with x-ray results.  She reports that she would like to know the x-ray results as she does not know what the x-ray showed.  Review of systems:  As stated above  Her past medical history, surgical history, family history, and social history obtained and reviewed.  Her past  medical history notable for hypertension, CHF, COPD, type 2 diabetes, hypothyroidism, and lumbar radiculopathy; she reports of having back surgery in 2007; does report of current tobacco use, 1 pack/day, has been smoking for the last 40 years; family history notable for type 2 diabetes; allergies and medications reviewed and are reflected in EMR  Physical exam: Vital signs are reviewed and are documented in the chart Gen.: Alert, oriented, appears stated age, in no apparent distress HEENT: Moist oral mucosa Respiratory: Normal respirations, able to speak in full sentences Cardiac: Regular rate, distal pulses 2+ Integumentary: No rashes on visible skin:  Neurologic: She has intact strength with knee flexion and knee extension on the right side, though does have pain with this, sensation 2+ in bilateral lower extremities Psych: Normal affect, mood is described as good Musculoskeletal: Inspection of right knee reveals no obvious deformity or muscle atrophy, she does have trace suprapatellar effusion noted, no warmth, erythema, ecchymosis noted otherwise, she is tender to palpation along the anterior patella, as well as medial and lateral joint lines, no tenderness over the quadriceps tendon or patellar tendon, no fullness or pain in the popliteal fossa, patellar apprehension and compression test equivocally positive, she does have limited range of motion today, range from 5 degrees to 110 degrees, do feel crepitation along arc of range of motion, no signs of ligamentous instability as Lachman, anterior drawer, valgus, varus stress testing negative, McMurray positive for both pain and crepitus, she does  walk with a slight antalgic gait favoring her right side  I was able to pull up her x-ray images of her right knee from [facility] dated 03/21/17.  4 views of her right knee were reviewed including AP, lateral, sunrise, Rosenberg.  She does have severe osteoarthritic changes noted with tricompartmental  osteoarthritis and bone-on-bone changes seen of the right knee.  Procedure:  After written informed consent signed and obtained, and benefit of pain relief and risk of bleeding, infection, and steroid failure discussed, Anjali agreed to proceed with cortisone injection into the right knee. After timeout, her right knee was cleaned with Betadine and alcohol wipes. Ethyl chloride was used as topical anesthetic spray. Using 5 cc 1% lidocaine without epinephrine and 1 cc of 40 mg Depo-Medrol on a 22-gauge 1.5 inch needle, her right knee was injected from an inferolateral approach. She did not have any bleeding afterwards. No complications noted from procedure.  Assessment and plan: 1. 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 Morbid obesity 3. Uncontrolled Type II Diabetes  Plan: Discussed with Shadi today that her knee pain is consistent with end-stage degenerative tricompartmental osteoarthritis.  Suspect that she is getting episodes of aggravation of pre-existing osteoarthritis.  Discussed option of cortisone injection into her right knee today.  She is agreeable to this.  Given her history of uncontrolled diabetes, will opt to do lower dose of cortisone today.  Discussed to keep an eye on her glucose levels as she can have a transient rise in her glucose levels secondary to this.  Injection done as noted above without any complications noted.  Discussed that since this is end-stage she can get injections every 3 months. Discussed that sooner rather than later she needs to consider having right knee TKA. Her BMI is 42.9, discussed that in order to safely have surgery she would need to have her BMI below 40. I discussed healthy weight loss strategies. I discussed there is a possibility that the cortisone injection could help her symptoms out tremendously and not have any issues for a while.  She does inquire about pain medications.  I discussed that NSAIDs are just as effective as  narcotics and that narcotic medications are not indicated for this particular reason.  Will have her started on meloxicam 15 mg daily, discussed not to take any other ibuprofen, Motrin, or Aleve while on this medication.  Discussed home exercise program focusing on quadriceps, hamstring, and hip strength.  Discussed resistance exercises.  Discussed if she is having continued pain or any other concerns to return to office.   Haynes Kernshristopher Lorann Tani, M.D. Primary Care Sports Medicine Fellow [facility]

## 2017-04-27 ENCOUNTER — Encounter: Payer: Self-pay | Admitting: Podiatry

## 2017-04-27 ENCOUNTER — Ambulatory Visit: Payer: Medicaid Other | Admitting: Podiatry

## 2017-04-27 VITALS — BP 167/56 | HR 74

## 2017-04-27 DIAGNOSIS — B351 Tinea unguium: Secondary | ICD-10-CM

## 2017-04-27 DIAGNOSIS — M79674 Pain in right toe(s): Secondary | ICD-10-CM | POA: Diagnosis not present

## 2017-04-27 DIAGNOSIS — M79675 Pain in left toe(s): Secondary | ICD-10-CM | POA: Diagnosis not present

## 2017-04-27 DIAGNOSIS — E1142 Type 2 diabetes mellitus with diabetic polyneuropathy: Secondary | ICD-10-CM

## 2017-04-27 NOTE — Progress Notes (Signed)
   Subjective:    Patient ID: [name], female    DOB: 09/05/1960, 57 y.o.   MRN: [id number]  HPI this patient presents the office saying that her nails are painful walking and wearing her shoes.  . She says her nails change appearance when she started taking insulin for her diabetes.  She says she has several nails that are growing into the corners especially the big toes on both feet.  She denies any drainage or infection noted to the great toes both feet.  This patient presents the office today for an evaluation and treatment of her long thick nails    Review of Systems  All other systems reviewed and are negative.      Objective:   Physical Exam General Appearance  Alert, conversant and in no acute stress.  Vascular  Dorsalis pedis and posterior pulses are palpable  bilaterally.  Capillary return is within normal limits  bilaterally. Temperature is within normal limits  Bilaterally.  Neurologic  Senn-Weinstein monofilament wire test diminished  bilaterally. Muscle power within normal limits bilaterally.  Nails Thick disfigured discolored nails with subungual debris bilaterally from hallux to fifth toes bilaterally. No evidence of bacterial infection or drainage bilaterally. Marked incurvation noted lateral border left hallux and medial border right great toenail. No infection at the site of the ingrown toenails.  Orthopedic  No limitations of motion of motion feet bilaterally.  No crepitus or effusions noted.  No bony pathology or digital deformities noted.  Skin  normotropic skin with no porokeratosis noted bilaterally.  No signs of infections or ulcers noted.          Assessment & Plan:  Onychomycosis  B/L  Ingrown Toenails  B/L  Diabetes with neuropathy  IE  Debridement of nails  X 10.  Patient was told that after today. If the pain persist from her ingrowing nails. We can consider surgical correction of the ingrowing toenail.  RTC 3 months   Helane GuntherGregory Dawnn Nam DPM

## 2017-05-11 ENCOUNTER — Other Ambulatory Visit: Payer: Self-pay | Admitting: Family Medicine

## 2017-05-23 ENCOUNTER — Ambulatory Visit (INDEPENDENT_AMBULATORY_CARE_PROVIDER_SITE_OTHER): Payer: Medicaid Other | Admitting: Family Medicine

## 2017-05-23 ENCOUNTER — Ambulatory Visit
Admission: RE | Admit: 2017-05-23 | Discharge: 2017-05-23 | Disposition: A | Discharge status: Home / Self Care | Payer: Medicaid Other | Source: Ambulatory Visit | Attending: Family Medicine | Admitting: Family Medicine

## 2017-05-23 ENCOUNTER — Encounter: Payer: Self-pay | Admitting: Family Medicine

## 2017-05-23 ENCOUNTER — Telehealth: Payer: Self-pay | Admitting: Family Medicine

## 2017-05-23 VITALS — BP 156/72 | Ht 64.0 in | Wt 250.0 lb

## 2017-05-23 DIAGNOSIS — M5412 Radiculopathy, cervical region: Secondary | ICD-10-CM | POA: Diagnosis present

## 2017-05-23 DIAGNOSIS — M1711 Unilateral primary osteoarthritis, right knee: Secondary | ICD-10-CM

## 2017-05-23 NOTE — Telephone Encounter (Signed)
Called Laurelin at 1600 to go over XR results of her C-spine. Unfortunately she was not able to answer phone. I left VM to have her call office to review results. 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 This is particularly noticeable at C4-C7.I would categorize this as mild. My plan will still be the same, have her do the HEP. Continue same pain meds that she has been on. She will be advised to call office in 4 weeks if she is not any better and MRI will be ordered.  Haynes Kernshristopher Lake, MD Primary Care Sports Medicine Fellow [facility]

## 2017-05-23 NOTE — Progress Notes (Signed)
Chief complaint: Follow up of right knee pain, new symptoms of right arm numbness and tingling x 3 weeks  History of present illness: [name] is a 57 year old female who presents to the sports medicine office today for follow-up of right knee pain.  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  When she was here 4 weeks ago, cortisone injection was done in her right knee.  She did get full relief of symptoms for 2 weeks.  She is here to follow-up on this today.  Unfortunately, she is not a surgical candidate at this time to her BMI being above 40.  She reports of continued medial joint line pain, pain with both flexion and extension of her right knee.  She has been using Voltaren as well as tramadol for pain.  Rest is only alleviating factor.  No numbness, tingling, burning paresthesias in bilateral lower extremities.  She reports that she has been doing some walking to help with weight loss, but reports that her back sometimes flares up on her.  She also wants to talk about new symptoms of right arm numbness and tingling.  She reports that symptoms have been present for approximately 3 weeks.  She reports numbness and tingling starting in the right posterior neck, radiating down the right arm into her right wrist and thumb.  She reports symptoms are really bothersome at nighttime.  She does not report of any neck pain or shoulder pain otherwise.  She is not report of any weakness in her right arm.  She reports that there was no mechanism of inciting factor, trauma, or injury to explain the symptoms that she is currently having.  She reports any type of forward flexion or reaching with her right arm or aggravating factors.  She is right-hand dominant.  She does have history of lumbar degenerative disc disease.  She has tried gabapentin in the past for lumbar degenerative disc disease and radiculopathy, unfortunately did not have any improvement with  that.  Review of systems:  As stated above  Interval past medical history, surgical history, family history, and social history obtained and unchanged. Her past medical history notable for hypertension, CHF, COPD, type 2 diabetes, hypothyroidism, and lumbar radiculopathy; she reports of having back surgery in 2007; does report of current tobacco use, 1 pack/day, has been smoking for the last 40 years; family history notable for type 2 diabetes; allergies and medications reviewed and are reflected in EMR   Physical exam: Vital signs are reviewed and are documented in the chart Gen.: Alert, oriented, appears stated age, in no apparent distress HEENT: Moist oral mucosa Respiratory: Normal respirations, able to speak in full sentences Cardiac: Regular rate, distal pulses 2+ Integumentary: No rashes on visible skin:  Neurologic: She does have intact rotator cuff strength on the right side, strength with elbow flexion, extension as well as grip strength are intact in right upper extremity, would categorize strength as 5/5 strength 5/5 with knee flexion and knee extension on the right side, sensation 2+ in bilateral upper extremities and lower extremities, DTRs symmetric and intact in bilateral upper extremities Psych: Normal affect, mood is described as good Musculoskeletal: Inspection of right knee reveals no obvious deformity or muscle atrophy, she continues to have trace suprapatellar effusion noted, no warmth, erythema, ecchymosis noted otherwise, she is tender to palpation along the medial joint line today, not so much along the lateral joint line, no tenderness over the quadriceps tendon or patellar tendon, no  fullness or pain in the popliteal fossa, range of motion is from 5 degrees to 110 degrees, do feel crepitation along arc of range of motion, no signs of ligamentous instability as Lachman, anterior drawer, valgus, varus stress testing negative, McMurray positive for both pain and crepitus,  she does walk with a slight antalgic gait favoring her right side;  Inspection of neck and right shoulder reveal no obvious deformity or muscle atrophy, no warmth, erythema, ecchymosis, or effusion, she is actually tender to palpation along the posterolateral aspect of her right shoulder over the trapezius, no tenderness over the cervical spine or paracervical spinal processes, she does have full shoulder range of motion, she does have some impingement testing positive with empty can, Hawkins, cross arm, Spurling is grossly positive on the right side for reproduction of paresthesias   Assessment and plan: 1. 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 Right-sides cervical radiculopathy 3. Morbid obesity 4. Uncontrolled Type II diabetes 5. Current tobacco use  Plan: Discussed with [name] that unfortunately her BMI does preclude her from having surgical intervention. For right total knee arthroplasty to be safely done, her BMI needs to be under 40.  She has never had Visco supplementation before.  Will have her set up with Dr. Norton Blizzard with [facility] for Visco supplementation. Discussed weight loss strategies, mainly focusing on dieting.  In addition, she is having symptoms that are concerning for right-sided cervical radiculopathy.  Will order for x-ray of her cervical spine to include AP and lateral.  Discussed neck exercises for her to do.  Will keep the same pain regimen for right now. Will have her use tramadol prescribed by her primary physician for pain. She reports that gabapentin did not help her in the past.  I do not want to start amitriptyline or nortriptyline due to her other medical conditions and medications.  If she is not any better in 4 weeks, will plan to have MRI obtained of her cervical spine for further evaluation. She will follow up here in 4 weeks or sooner as needed.   Haynes Kerns, M.D. Primary Care Sports Medicine Fellow [facility]  Sports Medicine

## 2017-05-30 ENCOUNTER — Ambulatory Visit: Payer: Self-pay | Admitting: Podiatry

## 2017-05-31 ENCOUNTER — Telehealth: Payer: Self-pay | Admitting: Family Medicine

## 2017-05-31 NOTE — Telephone Encounter (Signed)
Needs the results of her x-ray after [facility].

## 2017-05-31 NOTE — Telephone Encounter (Signed)
Called and spoke with [name] today at 1700 to go over the x-ray results of her cervical spine. I discussed that she does have loss of normal cervical lordosis in does have multilevel degenerative disc disease of the cervical spine, most particularly noticeable at C4-C7.  I discussed that my plan will still be the same as discussed that office visit.  We will have her do home exercise program for her neck.  Given no benefit with the meloxicam as well as tramadol, will switch meloxicam to diclofenac 75 mg twice daily for pain.  Discussed to call her primary doctor regarding the tramadol, as we do not manage chronic pain medication [facility]. Discussed to give this 2 more weeks to see how she does.  She will call the office in 2 weeks if she is not any better and MRI of her cervical spine will be ordered.  Given her history of type 2 diabetes that is uncontrolled, doing prednisone therapy is not be the best option for her.  She is already on Flexeril for muscle spasm.  Haynes Kernshristopher [place], MD Primary Care Sports Medicine Fellow [facility]

## 2017-06-01 ENCOUNTER — Other Ambulatory Visit: Payer: Self-pay | Admitting: *Deleted

## 2017-06-01 MED ORDER — DICLOFENAC SODIUM 75 MG PO TBEC: DELAYED_RELEASE_TABLET | ORAL | 1 refills | Status: DC

## 2017-06-20 ENCOUNTER — Ambulatory Visit (INDEPENDENT_AMBULATORY_CARE_PROVIDER_SITE_OTHER): Payer: Medicaid Other | Admitting: Family Medicine

## 2017-06-20 VITALS — BP 140/68 | Ht 64.0 in | Wt 250.0 lb

## 2017-06-20 DIAGNOSIS — M5412 Radiculopathy, cervical region: Secondary | ICD-10-CM | POA: Diagnosis not present

## 2017-06-20 NOTE — Progress Notes (Signed)
Chief complaint: Folow up of right arm numbness and tingling x7 weeks  History of present illness: [name] is a 57 year old right-hand-dominant female who presents to the sports medicine office today for follow-up of right arm numbness and tingling.  She was last here in the office about a month ago back on 05/23/2017.  At that time, she was following up on right knee pain, but wanted to talk about new symptoms of right arm numbness and tingling.  At that time, she reports that symptoms had been present for 3 weeks.  X-rays were done after last office visit, which did show mulit-level degenerative disc disease of the cervical spine, with it being particularly noticeable at C4-C7.  I did discuss home exercise program for her to do at home.  Despite doing the home exercise program as well as using her pain medication which includes tramadol, she is not having any interval improvement in her symptoms.  She reports that she did not pick up the diclofenac 75 mg twice daily that was prescribed to her after her last office visit.  She reports pain throughout the day, but mainly notices symptoms at nighttime. This is accentuated by any type of right arm abduction or extension.  She reports of a cramping sensation in her right hand and fingers at night.  She does report feeling slight weakness in her right arm secondary to pain.  She also reports of having neck spasms and tightening in her neck.    Unfortunately, due to her insurance she is not able to get approval or for Visco supplementation as she does have history of right knee end-stage degenerative tricompartmental osteoarthritis.  Review of systems:  As stated above  Interval past medical history, surgical history, family history, and social history obtained and unchanged. Her past medical history notable for hypertension, CHF, COPD, type 2 diabetes, hypothyroidism, and lumbar radiculopathy; shereports of having back surgery in 2007;does reportofcurrent  tobacco use, 1 pack/day, has been smoking for the last 40 years;family history notable for type 2 diabetes;allergies and medications reviewed and are reflected in EMR   Physical exam: Vital signs are reviewed and are documented in the chart Gen.: Alert, oriented, appears stated age, in no apparent distress HEENT: Moist oral mucosa Respiratory: Normal respirations, able to speak in full sentences Cardiac: Regular rate, distal pulses 2+ Integumentary: No rashes on visible skin:  Neurologic: She does have intact right rotator cuff strength, biceps, triceps strength, intact strength with elbow flexion and extension, as well as wrist flexion, extension and grip strength, would categorize this as 5/5, sensation 2+ in bilateral upper extremities Psych: Normal affect, mood is described as good Musculoskeletal: Inspection of her neck and right shoulder reveal no obvious deformity or muscle atrophy, no warmth, erythema, ecchymosis, or effusion, she continues to have tenderness to palpation today along paracervical spinous processes, as well as the posterolateral aspect of her right shoulder over the lower trapezius muscles, she continues to have full range of motion of her right shoulder, she does have pain with some impingement testing, with empty can and Hawkins being positive, Spurling is grossly positive with immediate reproduction of paresthesias going down the right side into the right hand and fingers affecting her thumb, index, and middle finger  Assessment and plan: 1.  Right-sided cervical radiculopathy, concern for C5 nerve root impingement 2.  Right knee end-stage degenerative tricompartmental osteoarthritis 3.  Morbid obesity 4.  Uncontrolled type 2 diabetes 5.  Current tobacco use  Plan: At this juncture, given her  continued pain and symptoms, as well as no improvement with home exercise program and tramadol, discussed that next best step would be to obtain an MRI of her cervical spine  to rule out disc herniation.  Discussed that the diclofenac 75 mg twice daily prescription is available for her to pick up and use, as this may provide her with some relief. Will call her after MRI results, with next step to be determined based on this.  Discussed that she can get cortisone injections in her right knee every 3 months, but encouraged continued weight loss to get her BMI under 40.   Haynes Kerns, M.D. Primary Care Sports Medicine Fellow [facility] Sports Medicine

## 2017-06-21 ENCOUNTER — Other Ambulatory Visit: Payer: Self-pay

## 2017-06-21 MED ORDER — DICLOFENAC SODIUM 75 MG PO TBEC: DELAYED_RELEASE_TABLET | ORAL | 1 refills | Status: DC

## 2017-06-25 ENCOUNTER — Other Ambulatory Visit: Payer: Self-pay | Admitting: Family Medicine

## 2017-06-27 ENCOUNTER — Other Ambulatory Visit: Payer: Self-pay | Admitting: Family Medicine

## 2017-06-28 ENCOUNTER — Ambulatory Visit
Admission: RE | Admit: 2017-06-28 | Discharge: 2017-06-28 | Disposition: A | Discharge status: Home / Self Care | Payer: Medicaid Other | Source: Ambulatory Visit | Attending: Family Medicine | Admitting: Family Medicine

## 2017-06-28 DIAGNOSIS — M5412 Radiculopathy, cervical region: Secondary | ICD-10-CM

## 2017-07-03 ENCOUNTER — Other Ambulatory Visit: Payer: Self-pay

## 2017-07-03 ENCOUNTER — Telehealth: Payer: Self-pay | Admitting: Family Medicine

## 2017-07-03 DIAGNOSIS — M5412 Radiculopathy, cervical region: Secondary | ICD-10-CM

## 2017-07-03 NOTE — Telephone Encounter (Signed)
Called and spoke to [facility] this morning at 0900 to discuss MRI results of her cervical spine.  Discussed with her that she does have multilevel cervical degenerative disc disease, with height loss and ridging noted from C4-5 to C7-T1, this the greatest noted at C6-7.  There is foraminal impingement at C5-6 and C6-7, also on the left side as well.  There is central disc protrusion that does contact the cord, but this does not cause any compression.  There is also central disc protrusion without impingement at C7-T1.  Discussed that next best step would be to have her referred to [facility] imaging for epidural steroid injection.  She is agreeable to this.  Will send referral/order in to have this arranged.  She reports that she would also like to have follow-up with her right knee pain.  She reports that she will call later on this morning to schedule this appointment.    Haynes Kerns, MD Primary Care Sports Medicine Fellow [facility] Sports Medicine

## 2017-07-04 ENCOUNTER — Other Ambulatory Visit: Payer: Self-pay | Admitting: Family Medicine

## 2017-07-04 DIAGNOSIS — M5030 Other cervical disc degeneration, unspecified cervical region: Secondary | ICD-10-CM

## 2017-07-12 ENCOUNTER — Ambulatory Visit
Admission: RE | Admit: 2017-07-12 | Discharge: 2017-07-12 | Disposition: A | Discharge status: Home / Self Care | Payer: Medicaid Other | Source: Ambulatory Visit | Attending: Family Medicine | Admitting: Family Medicine

## 2017-07-12 DIAGNOSIS — F17200 Nicotine dependence, unspecified, uncomplicated: Secondary | ICD-10-CM | POA: Insufficient documentation

## 2017-07-12 DIAGNOSIS — E785 Hyperlipidemia, unspecified: Secondary | ICD-10-CM | POA: Insufficient documentation

## 2017-07-12 DIAGNOSIS — M5030 Other cervical disc degeneration, unspecified cervical region: Secondary | ICD-10-CM

## 2017-07-12 DIAGNOSIS — J40 Bronchitis, not specified as acute or chronic: Secondary | ICD-10-CM | POA: Insufficient documentation

## 2017-07-12 DIAGNOSIS — K219 Gastro-esophageal reflux disease without esophagitis: Secondary | ICD-10-CM | POA: Insufficient documentation

## 2017-07-12 MED ORDER — IOPAMIDOL (ISOVUE-M 300) INJECTION 61%
1.0000 mL | Freq: Once | INTRAMUSCULAR | Status: AC | PRN
Start: 2017-07-12 — End: 2017-07-12
  Administered 2017-07-12: 1 mL via EPIDURAL

## 2017-07-12 MED ORDER — TRIAMCINOLONE ACETONIDE 40 MG/ML IJ SUSP (RADIOLOGY)
60.0000 mg | Freq: Once | INTRAMUSCULAR | Status: AC
  Administered 2017-07-12: 60 mg via EPIDURAL

## 2017-07-12 NOTE — Discharge Instructions (Signed)

## 2017-07-13 ENCOUNTER — Ambulatory Visit: Payer: Medicaid Other | Admitting: Family Medicine

## 2017-07-24 ENCOUNTER — Encounter: Payer: Self-pay | Admitting: Podiatry

## 2017-07-24 ENCOUNTER — Encounter

## 2017-07-24 ENCOUNTER — Ambulatory Visit: Payer: Medicaid Other | Admitting: Podiatry

## 2017-07-24 DIAGNOSIS — M79674 Pain in right toe(s): Secondary | ICD-10-CM

## 2017-07-24 DIAGNOSIS — E1165 Type 2 diabetes mellitus with hyperglycemia: Secondary | ICD-10-CM

## 2017-07-24 DIAGNOSIS — M79675 Pain in left toe(s): Secondary | ICD-10-CM | POA: Diagnosis not present

## 2017-07-24 DIAGNOSIS — E1142 Type 2 diabetes mellitus with diabetic polyneuropathy: Secondary | ICD-10-CM

## 2017-07-24 DIAGNOSIS — B351 Tinea unguium: Secondary | ICD-10-CM

## 2017-07-24 NOTE — Progress Notes (Signed)
Complaint:  Visit Type: Patient returns to my office for continued preventative foot care services. Complaint: Patient states" my nails have grown long and thick and become painful to walk and wear shoes" Patient has been diagnosed with DM with mild neuropathy.. The patient presents for preventative foot care services. No changes to ROS  Podiatric Exam: Vascular: dorsalis pedis and posterior tibial pulses are palpable bilateral. Capillary return is immediate. Temperature gradient is WNL. Skin turgor WNL  Sensorium: Normal Semmes Weinstein monofilament test. Normal tactile sensation bilaterally. Nail Exam: Pt has thick disfigured discolored nails with subungual debris noted bilateral entire nail hallux through fifth toenails.  Marked incurvation right great toenail. Ulcer Exam: There is no evidence of ulcer or pre-ulcerative changes or infection. Orthopedic Exam: Muscle tone and strength are WNL. No limitations in general ROM. No crepitus or effusions noted. Foot type and digits show no abnormalities. Bony prominences are unremarkable. Skin: No Porokeratosis. No infection or ulcers  Diagnosis:  Onychomycosis, , Pain in right toe, pain in left toes  Treatment & Plan Procedures and Treatment: Consent by patient was obtained for treatment procedures.   Debridement of mycotic and hypertrophic toenails, 1 through 5 bilateral and clearing of subungual debris. No ulceration, no infection noted.  Return Visit-Office Procedure: Patient instructed to return to the office for a follow up visit 3 months for continued evaluation and treatment.    Helane Gunther DPM

## 2017-08-17 ENCOUNTER — Other Ambulatory Visit: Payer: Self-pay | Admitting: Family Medicine

## 2017-08-21 ENCOUNTER — Other Ambulatory Visit: Payer: Self-pay | Admitting: Internal Medicine

## 2017-09-11 ENCOUNTER — Ambulatory Visit: Payer: Medicaid Other | Admitting: Sports Medicine

## 2017-09-17 ENCOUNTER — Ambulatory Visit: Payer: Medicaid Other | Admitting: Sports Medicine

## 2017-09-25 ENCOUNTER — Ambulatory Visit (INDEPENDENT_AMBULATORY_CARE_PROVIDER_SITE_OTHER): Payer: Medicaid Other | Admitting: Sports Medicine

## 2017-09-25 VITALS — BP 127/68 | Ht 64.0 in | Wt 260.0 lb

## 2017-09-25 DIAGNOSIS — M5412 Radiculopathy, cervical region: Secondary | ICD-10-CM | POA: Diagnosis not present

## 2017-09-25 DIAGNOSIS — M1711 Unilateral primary osteoarthritis, right knee: Secondary | ICD-10-CM | POA: Diagnosis not present

## 2017-09-25 NOTE — Patient Instructions (Addendum)
You are scheduled to see Dr. Turner Danielsowan for your right knee [facility] [address] [place] [phone]  Appt: 10/04/2017 @ 3:15 pm - arrive at 2:45 pm  We will place a referral to Dr. Earl GalaNudelman's office and they will call you to schedule an appt to discuss your neck. He is with [facility] at [employer] [phone]

## 2017-09-26 NOTE — Progress Notes (Signed)
   Subjective:    Patient ID: [name], female    DOB: 07/25/1960, 57 y.o.   MRN: [id number]  HPI   Patient comes in today for follow-up on neck pain and right knee pain. MRI of her cervical spine done recently shows degenerative disc disease with foraminal stenosis. She continues to endorse pain, numbness, and tingling down the right arm. This is despite a recent cervical ESI. She's tried multiple medications as well. She has a history of lumbar radiculopathy which required surgery by Dr. Newell CoralNudelman in 2007. She had an excellent postoperative recovery. She also complains of right knee pain which persist despite recent cortisone injection. X-rays of this knee show end-stage DJD. He continues to endorse diffuse pain and intermittent swelling. Instability as well.    Review of Systems As above    Objective:   Physical Exam  Well-developed, well-nourished. No acute distress. Sitting comfortably in exam room  Cervical spine: Limited cervical range of motion. Positive Spurling's to the right. No tenderness to palpation along cervical midline.  Neurological exam: Strength is 5/5 both upper extremities. Reflexes are trace but equal in the biceps, triceps, and brachioradialis tendons. No atrophy. Good pulses.  Right knee: Range of motion 0-110 degrees. 1+ boggy synovitis. Diffuse tenderness to palpation. Knee is grossly stable ligamentous exam. Neurovascular intact distally.      Assessment & Plan:  Neck pain and right arm radiculopathy secondary to cervical degenerative disc disease Right knee pain secondary to end-stage DJD  Since the patient has seen Dr. Newell CoralNudelman in the past, I recommended a referral to him to discuss further treatment of her cervical spine. I've also recommended [facility] to discuss merits of total knee arthroplasty. Patient's BMI is 42 so she understands that she may need to lose weight prior to undergoing surgery. If Dr. Turner Danielsowan does  not feel that she is a good surgical candidate then perhaps he could offer her Visco supplementation while she works on weight loss. Follow-up with me as needed.

## 2017-10-09 ENCOUNTER — Telehealth: Payer: Self-pay | Admitting: Sports Medicine

## 2017-10-09 NOTE — Telephone Encounter (Signed)
Patient has not received a phone call to schedule the pain management appointment yet.  Please let her know how to proceed, thanks.

## 2017-10-10 NOTE — Telephone Encounter (Signed)
Patient scheduled to see Newell Coraludelman on Tuesday August 27th at 230pm. [facility]. [address] [place] [address]. Phone; [phone]

## 2017-10-23 ENCOUNTER — Other Ambulatory Visit: Payer: Self-pay | Admitting: Neurosurgery

## 2017-10-24 ENCOUNTER — Ambulatory Visit: Payer: Medicaid Other | Admitting: Podiatry

## 2017-10-31 ENCOUNTER — Other Ambulatory Visit: Payer: Self-pay | Admitting: Neurosurgery

## 2017-10-31 NOTE — Pre-Procedure Instructions (Signed)
[name]  10/31/2017      [facility] [address] [place], [address] [place] [address] Phone: [phone] Fax: [phone]  [facility] - [place], [place] - [place], [place] - [address] [facility] [address] [place] [address] Phone: [phone] Fax: [phone]    Your procedure is scheduled on November 07, 2017 .  Report to [facility] Admitting at 630 AM.  Call this number if you have problems the morning of surgery:  [phone]   Remember:  Do not eat or drink after midnight.    Take these medicines the morning of surgery with A SIP OF WATER  Albuterol inhaler-if needed (bring inhaler with you) Asmanex inhaler Fluoxetine (prozac) Levothyroxine (synthroid) Nasonex nasal spray-if needed Omeprazole (prilosec)  7 days prior to surgery STOP taking any diclofenac (voltaren), meloxicam (mobic), Aspirin (unless otherwise instructed by your surgeon), Aleve, Naproxen, Ibuprofen, Motrin, Advil, Goody's, BC's, all herbal medications, fish oil, and all vitamins   WHAT DO I DO ABOUT MY DIABETES MEDICATION?  Marland Kitchen Do not take oral diabetes medicines (pills) the morning of surgery.  . THE NIGHT BEFORE SURGERY, take your normal dose of novolog insulin with dinner (5units) and 1/2 of your normal dose of lantus insulin (18 units)      . THE MORNING OF SURGERY, take no insulin unless your CBG is greater than 220 mg/dL, then you may take  of your sliding scale (correction) dose of insulin ( 2 units of novolog insulin)  . The day of surgery, do not take other diabetes injectables, including Byetta (exenatide), Bydureon (exenatide ER), Victoza (liraglutide), or Trulicity (dulaglutide).  . If your CBG is greater than 220 mg/dL, you may take  of your sliding scale (correction) dose of insulin ( 2 units of novolog insulin).  Reviewed and Endorsed by [facility] Patient Education Committee, August 2015  How to Manage  Your Diabetes Before and After Surgery  Why is it important to control my blood sugar before and after surgery? . Improving blood sugar levels before and after surgery helps healing and can limit problems. . A way of improving blood sugar control is eating a healthy diet by: o  Eating less sugar and carbohydrates o  Increasing activity/exercise o  Talking with your doctor about reaching your blood sugar goals . High blood sugars (greater than 180 mg/dL) can raise your risk of infections and slow your recovery, so you will need to focus on controlling your diabetes during the weeks before surgery. . Make sure that the doctor who takes care of your diabetes knows about your planned surgery including the date and location.  How do I manage my blood sugar before surgery? . Check your blood sugar at least 4 times a day, starting 2 days before surgery, to make sure that the level is not too high or low. o Check your blood sugar the morning of your surgery when you wake up and every 2 hours until you get to the Short Stay unit. . If your blood sugar is less than 70 mg/dL, you will need to treat for low blood sugar: o Do not take insulin. o Treat a low blood sugar (less than 70 mg/dL) with  cup of clear juice (cranberry or apple), 4 glucose tablets, OR glucose gel. Recheck blood sugar in 15 minutes after treatment (to make sure it is greater than 70 mg/dL). If your blood sugar is not greater than 70 mg/dL on recheck,  call [phone] o  for further instructions. . Report your blood sugar to the short stay nurse when you get to Short Stay.  . If you are admitted to the hospital after surgery: o Your blood sugar will be checked by the staff and you will probably be given insulin after surgery (instead of oral diabetes medicines) to make sure you have good blood sugar levels. o The goal for blood sugar control after surgery is 80-180 mg/dL.    Do not wear jewelry, make-up or nail polish.  Do not  wear lotions, powders, or perfumes, or deodorant.  Do not shave 48 hours prior to surgery.    Do not bring valuables to the hospital.  [facility] is not responsible for any belongings or valuables.  Contacts, dentures or bridgework may not be worn into surgery.  Leave your suitcase in the car.  After surgery it may be brought to your room.  For patients admitted to the hospital, discharge time will be determined by your treatment team.  Patients discharged the day of surgery will not be allowed to drive home.    [facility]- Preparing For Surgery  Before surgery, you can play an important role. Because skin is not sterile, your skin needs to be as free of germs as possible. You can reduce the number of germs on your skin by washing with CHG (chlorahexidine gluconate) Soap before surgery.  CHG is an antiseptic cleaner which kills germs and bonds with the skin to continue killing germs even after washing.    Oral Hygiene is also important to reduce your risk of infection.  Remember - BRUSH YOUR TEETH THE MORNING OF SURGERY WITH YOUR REGULAR TOOTHPASTE  Please do not use if you have an allergy to CHG or antibacterial soaps. If your skin becomes reddened/irritated stop using the CHG.  Do not shave (including legs and underarms) for at least 48 hours prior to first CHG shower. It is OK to shave your face.  Please follow these instructions carefully.   1. Shower the NIGHT BEFORE SURGERY and the MORNING OF SURGERY with CHG.   2. If you chose to wash your hair, wash your hair first as usual with your normal shampoo.  3. After you shampoo, rinse your hair and body thoroughly to remove the shampoo.  4. Use CHG as you would any other liquid soap. You can apply CHG directly to the skin and wash gently with a scrungie or a clean washcloth.   5. Apply the CHG Soap to your body ONLY FROM THE NECK DOWN.  Do not use on open wounds or open sores. Avoid contact with your eyes, ears, mouth and genitals  (private parts). Wash Face and genitals (private parts)  with your normal soap.  6. Wash thoroughly, paying special attention to the area where your surgery will be performed.  7. Thoroughly rinse your body with warm water from the neck down.  8. DO NOT shower/wash with your normal soap after using and rinsing off the CHG Soap.  9. Pat yourself dry with a CLEAN TOWEL.  10. Wear CLEAN PAJAMAS to bed the night before surgery, wear comfortable clothes the morning of surgery  11. Place CLEAN SHEETS on your bed the night of your first shower and DO NOT SLEEP WITH PETS.  Day of Surgery:  Do not apply any deodorants/lotions.  Please wear clean clothes to the hospital/surgery center.   Remember to brush your teeth WITH YOUR REGULAR TOOTHPASTE.  Please read over the  following fact sheets that you were given. Pain Booklet, Coughing and Deep Breathing, MRSA Information and Surgical Site Infection Prevention

## 2017-11-01 ENCOUNTER — Encounter (HOSPITAL_COMMUNITY): Payer: Self-pay

## 2017-11-01 ENCOUNTER — Encounter (HOSPITAL_COMMUNITY)
Admission: RE | Admit: 2017-11-01 | Discharge: 2017-11-01 | Disposition: A | Discharge status: Home / Self Care | Payer: Medicaid Other | Source: Ambulatory Visit | Attending: Neurosurgery | Admitting: Neurosurgery

## 2017-11-01 ENCOUNTER — Other Ambulatory Visit: Payer: Self-pay

## 2017-11-01 DIAGNOSIS — R9431 Abnormal electrocardiogram [ECG] [EKG]: Secondary | ICD-10-CM | POA: Insufficient documentation

## 2017-11-01 DIAGNOSIS — Z01818 Encounter for other preprocedural examination: Secondary | ICD-10-CM | POA: Insufficient documentation

## 2017-11-01 DIAGNOSIS — M50222 Other cervical disc displacement at C5-C6 level: Secondary | ICD-10-CM | POA: Diagnosis not present

## 2017-11-01 HISTORY — DX: Bronchitis, not specified as acute or chronic: J40

## 2017-11-01 HISTORY — DX: Gastro-esophageal reflux disease without esophagitis: K21.9

## 2017-11-01 HISTORY — DX: Depression, unspecified: F32.A

## 2017-11-01 HISTORY — DX: Major depressive disorder, single episode, unspecified: F32.9

## 2017-11-01 HISTORY — DX: Cardiac murmur, unspecified: R01.1

## 2017-11-01 HISTORY — DX: Sleep apnea, unspecified: G47.30

## 2017-11-01 LAB — COMPREHENSIVE METABOLIC PANEL
ALT: 19 U/L (ref 0–44)
ANION GAP: 15 (ref 5–15)
AST: 18 U/L (ref 15–41)
Albumin: 3.6 g/dL (ref 3.5–5.0)
Alkaline Phosphatase: 105 U/L (ref 38–126)
BILIRUBIN TOTAL: 0.3 mg/dL (ref 0.3–1.2)
BUN: 8 mg/dL (ref 6–20)
CO2: 23 mmol/L (ref 22–32)
Calcium: 9.2 mg/dL (ref 8.9–10.3)
Chloride: 103 mmol/L (ref 98–111)
Creatinine, Ser: 1.07 mg/dL — ABNORMAL HIGH (ref 0.44–1.00)
GFR calc Af Amer: >60 mL/min (ref >60)
GFR calc non Af Amer: 57 mL/min — ABNORMAL LOW (ref >60)
GLUCOSE: 119 mg/dL — AB (ref 70–99)
POTASSIUM: 3.8 mmol/L (ref 3.5–5.1)
SODIUM: 141 mmol/L (ref 135–145)
TOTAL PROTEIN: 7 g/dL (ref 6.5–8.1)

## 2017-11-01 LAB — CBC
HEMATOCRIT: 45.2 % (ref 36.0–46.0)
HEMOGLOBIN: 14.3 g/dL (ref 12.0–15.0)
MCH: 29.9 pg (ref 26.0–34.0)
MCHC: 31.6 g/dL (ref 30.0–36.0)
MCV: 94.4 fL (ref 78.0–100.0)
Platelets: 306 10*3/uL (ref 150–400)
RBC: 4.79 MIL/uL (ref 3.87–5.11)
RDW: 15.8 % — AB (ref 11.5–15.5)
WBC: 9.9 10*3/uL (ref 4.0–10.5)

## 2017-11-01 LAB — ABO/RH: ABO/RH(D): B POS

## 2017-11-01 LAB — SURGICAL PCR SCREEN
MRSA, PCR: NEGATIVE
Staphylococcus aureus: NEGATIVE

## 2017-11-01 LAB — GLUCOSE, CAPILLARY: Glucose-Capillary: 150 mg/dL — ABNORMAL HIGH (ref 70–99)

## 2017-11-01 LAB — HEMOGLOBIN A1C
Hgb A1c MFr Bld: 7.3 % — ABNORMAL HIGH (ref 4.8–5.6)
MEAN PLASMA GLUCOSE: 162.81 mg/dL

## 2017-11-01 LAB — TYPE AND SCREEN
ABO/RH(D): B POS
ANTIBODY SCREEN: NEGATIVE

## 2017-11-01 NOTE — Progress Notes (Addendum)
PCP -  Caffie Damme MD  Chest x-ray -  EKG - 11/01/17   Fasting Blood Sugar - 130s Checks Blood Sugar 1 times a day   Aspirin Instructions: N/A  Anesthesia review: yes, EKG review  Patient denies shortness of breath, fever, cough and chest pain at PAT appointment   Patient verbalized understanding of instructions that were given to them at the PAT appointment. Patient was also instructed that they will need to review over the PAT instructions again at home before surgery.

## 2017-11-02 ENCOUNTER — Encounter (HOSPITAL_COMMUNITY): Payer: Self-pay | Admitting: Physician Assistant
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
## 2017-11-07 ENCOUNTER — Encounter (HOSPITAL_COMMUNITY): Admission: RE | Payer: Self-pay | Source: Ambulatory Visit

## 2017-11-07 ENCOUNTER — Ambulatory Visit (HOSPITAL_COMMUNITY): Admission: RE | Admit: 2017-11-07 | Payer: Medicaid Other | Source: Ambulatory Visit | Admitting: Neurosurgery

## 2017-11-07 SURGERY — ANTERIOR CERVICAL DECOMPRESSION/DISCECTOMY FUSION 2 LEVELS
Anesthesia: General

## 2017-11-12 ENCOUNTER — Other Ambulatory Visit: Payer: Self-pay

## 2017-11-12 MED ORDER — MELOXICAM 15 MG PO TABS: 15.0000 mg | ORAL_TABLET | Freq: Every day | ORAL | 1 refills | Status: DC

## 2017-12-01 ENCOUNTER — Other Ambulatory Visit: Payer: Self-pay | Admitting: Family Medicine

## 2017-12-07 ENCOUNTER — Other Ambulatory Visit: Payer: Self-pay | Admitting: Family Medicine

## 2017-12-19 ENCOUNTER — Other Ambulatory Visit: Payer: Self-pay | Admitting: Family Medicine

## 2018-01-01 ENCOUNTER — Other Ambulatory Visit: Payer: Self-pay | Admitting: Family Medicine

## 2018-01-02 ENCOUNTER — Other Ambulatory Visit: Payer: Self-pay | Admitting: Family Medicine

## 2018-01-16 ENCOUNTER — Other Ambulatory Visit: Payer: Self-pay | Admitting: Family Medicine

## 2018-01-29 ENCOUNTER — Other Ambulatory Visit: Payer: Self-pay | Admitting: *Deleted

## 2018-01-29 MED ORDER — MELOXICAM 15 MG PO TABS: ORAL_TABLET | ORAL | 0 refills | Status: DC

## 2018-04-08 ENCOUNTER — Encounter: Payer: Self-pay | Admitting: *Deleted

## 2018-04-08 NOTE — Patient Instructions (Addendum)
Faxed Mobic requested back with a denial. Per Dr Margaretha Sheffieldraper, patient need to see PCP and have kidney function testing (BUN Creatinine) before we can refill this medication.  [facility]  Phone: [phone] Fax: [phone]

## 2018-08-21 ENCOUNTER — Other Ambulatory Visit: Payer: Self-pay | Admitting: Neurosurgery

## 2018-10-03 ENCOUNTER — Other Ambulatory Visit: Payer: Self-pay | Admitting: Neurosurgery

## 2018-10-07 ENCOUNTER — Other Ambulatory Visit (HOSPITAL_COMMUNITY): Payer: Medicaid Other

## 2018-10-07 NOTE — Progress Notes (Addendum)
[place], [place] [address] Phone: [phone] Fax: [phone]  [place], [place] [facility] [place] [address] Phone: [phone] Fax: [phone]    Your procedure is scheduled on Monday, August 17th.  Report to [facility] Main Entrance "A" at 5:30 A.M., and check in at the Admitting office.  Call this number if you have problems the morning of surgery:  [phone]  Call [phone] if you have any questions prior to your surgery date Monday-Friday 8am-4pm   Remember:  Do not eat or drink after midnight the night before your surgery   Take these medicines the morning of surgery with A SIP OF WATER atorvastatin (LIPITOR) FLUoxetine (PROZAC) levothyroxine (SYNTHROID, LEVOTHROID)  omeprazole (PRILOSEC)  If needed - albuterol (PROVENTIL HFA;VENTOLIN HFA) 108 (90 Base)- inhaler, mometasone (ASMANEX 60 METERED DOSES) - inhaler, mometasone (NASONEX) - nasal spray  7 days prior to surgery STOP taking any Aspirin (unless otherwise instructed by your surgeon), meloxicam (MOBIC), Aleve, Naproxen, Ibuprofen, Motrin, Advil, Goody's, BC's, all herbal medications, fish oil, and all vitamins.  How to Manage Your Diabetes Before and After Surgery  Why is it important to control my blood sugar before and after surgery? . Improving blood sugar levels before and after surgery helps healing and can limit problems. . A way of improving blood sugar control is eating a healthy diet by: o  Eating less sugar and carbohydrates o  Increasing activity/exercise o  Talking with your doctor about reaching your blood sugar goals . High blood sugars (greater than 180 mg/dL) can raise your risk of infections and slow your recovery, so you will need to focus on controlling your diabetes during the weeks before surgery. . Make sure that the doctor who takes care of your diabetes  knows about your planned surgery including the date and location.  How do I manage my blood sugar before surgery? . Check your blood sugar at least 4 times a day, starting 2 days before surgery, to make sure that the level is not too high or low. o Check your blood sugar the morning of your surgery when you wake up and every 2 hours until you get to the Short Stay unit. . If your blood sugar is less than 70 mg/dL, you will need to treat for low blood sugar: o Do not take insulin. o Treat a low blood sugar (less than 70 mg/dL) with  cup of clear juice (cranberry or apple), 4 glucose tablets, OR glucose gel. Recheck blood sugar in 15 minutes after treatment (to make sure it is greater than 70 mg/dL). If your blood sugar is not greater than 70 mg/dL on recheck, call [phone] o  for further instructions. . Report your blood sugar to the short stay nurse when you get to Short Stay.  . If you are admitted to the hospital after surgery: o Your blood sugar will be checked by the staff and you will probably be given insulin after surgery (instead of oral diabetes medicines) to make sure you have good blood sugar levels. o The goal for blood sugar control after surgery is 80-180 mg/dL.  WHAT DO I DO ABOUT MY DIABETES MEDICATION?  Marland Kitchen Do not take oral diabetes medicines (pills) the morning of surgery.  . THE NIGHT BEFORE SURGERY, take 18 units of insulin glargine (LANTUS)     . THE MORNING OF SURGERY, DO NOT  take insulin aspart (NOVOLOG).  Reviewed and Endorsed by [facility] Patient Education Committee, August 2015  The Morning of Surgery  Do not wear jewelry, make-up or nail polish.  Do not wear lotions, powders, or perfumes/colognes, or deodorant  Do not shave 48 hours prior to surgery.  Men may shave face and neck.  Do not bring valuables to the hospital.  [facility] is not responsible for any belongings or valuables.  If you are a smoker, DO NOT Smoke 24 hours prior to surgery IF you  wear a CPAP at night please bring your mask, tubing, and machine the morning of surgery   Remember that you must have someone to transport you home after your surgery, and remain with you for 24 hours if you are discharged the same day.  Contacts, glasses, hearing aids, dentures or bridgework may not be worn into surgery.   Leave your suitcase in the car.  After surgery it may be brought to your room.  For patients admitted to the hospital, discharge time will be determined by your treatment team.  Patients discharged the day of surgery will not be allowed to drive home.   Special instructions:   [place]- Preparing For Surgery  Before surgery, you can play an important role. Because skin is not sterile, your skin needs to be as free of germs as possible. You can reduce the number of germs on your skin by washing with CHG (chlorahexidine gluconate) Soap before surgery.  CHG is an antiseptic cleaner which kills germs and bonds with the skin to continue killing germs even after washing.    Oral Hygiene is also important to reduce your risk of infection.  Remember - BRUSH YOUR TEETH THE MORNING OF SURGERY WITH YOUR REGULAR TOOTHPASTE  Please do not use if you have an allergy to CHG or antibacterial soaps. If your skin becomes reddened/irritated stop using the CHG.  Do not shave (including legs and underarms) for at least 48 hours prior to first CHG shower. It is OK to shave your face.  Please follow these instructions carefully.   1. Shower the NIGHT BEFORE SURGERY and the MORNING OF SURGERY with CHG Soap.   2. If you chose to wash your hair, wash your hair first as usual with your normal shampoo.  3. After you shampoo, rinse your hair and body thoroughly to remove the shampoo.  4. Use CHG as you would any other liquid soap. You can apply CHG directly to the skin and wash gently with a scrungie or a clean washcloth.   5. Apply the CHG Soap to your body ONLY FROM THE NECK DOWN.  Do not  use on open wounds or open sores. Avoid contact with your eyes, ears, mouth and genitals (private parts). Wash Face and genitals (private parts)  with your normal soap.   6. Wash thoroughly, paying special attention to the area where your surgery will be performed.  7. Thoroughly rinse your body with warm water from the neck down.  8. DO NOT shower/wash with your normal soap after using and rinsing off the CHG Soap.  9. Pat yourself dry with a CLEAN TOWEL.  10. Wear CLEAN PAJAMAS to bed the night before surgery, wear comfortable clothes the morning of surgery  11. Place CLEAN SHEETS on your bed the night of your first shower and DO NOT SLEEP WITH PETS.  Day of Surgery:  Do not apply any deodorants/lotions. Please shower the morning of surgery with the CHG soap  Please wear clean clothes  to the hospital/surgery center.   Remember to brush your teeth WITH YOUR REGULAR TOOTHPASTE.  Please read over the following fact sheets that you were given.

## 2018-10-08 ENCOUNTER — Encounter (HOSPITAL_COMMUNITY)
Admission: RE | Admit: 2018-10-08 | Discharge: 2018-10-08 | Disposition: A | Discharge status: Home / Self Care | Payer: Medicare Other | Source: Ambulatory Visit | Attending: Neurosurgery | Admitting: Neurosurgery

## 2018-10-08 ENCOUNTER — Encounter (HOSPITAL_COMMUNITY): Payer: Self-pay

## 2018-10-08 ENCOUNTER — Other Ambulatory Visit: Payer: Self-pay

## 2018-10-08 DIAGNOSIS — Z01812 Encounter for preprocedural laboratory examination: Secondary | ICD-10-CM | POA: Diagnosis present

## 2018-10-08 DIAGNOSIS — Z20828 Contact with and (suspected) exposure to other viral communicable diseases: Secondary | ICD-10-CM | POA: Diagnosis not present

## 2018-10-08 HISTORY — DX: Other complications of anesthesia, initial encounter: T88.59XA

## 2018-10-08 LAB — BASIC METABOLIC PANEL
Anion gap: 12 (ref 5–15)
BUN: 15 mg/dL (ref 6–20)
CO2: 24 mmol/L (ref 22–32)
Calcium: 9 mg/dL (ref 8.9–10.3)
Chloride: 100 mmol/L (ref 98–111)
Creatinine, Ser: 0.95 mg/dL (ref 0.44–1.00)
GFR calc Af Amer: >60 mL/min (ref >60)
GFR calc non Af Amer: >60 mL/min (ref >60)
Glucose, Bld: 204 mg/dL — ABNORMAL HIGH (ref 70–99)
Potassium: 3.5 mmol/L (ref 3.5–5.1)
Sodium: 136 mmol/L (ref 135–145)

## 2018-10-08 LAB — HEMOGLOBIN A1C
Hgb A1c MFr Bld: 10.6 % — ABNORMAL HIGH (ref 4.8–5.6)
Mean Plasma Glucose: 257.52 mg/dL

## 2018-10-08 LAB — GLUCOSE, CAPILLARY: Glucose-Capillary: 209 mg/dL — ABNORMAL HIGH (ref 70–99)

## 2018-10-08 LAB — CBC
HCT: 44.5 % (ref 36.0–46.0)
Hemoglobin: 14.8 g/dL (ref 12.0–15.0)
MCH: 31.4 pg (ref 26.0–34.0)
MCHC: 33.3 g/dL (ref 30.0–36.0)
MCV: 94.5 fL (ref 80.0–100.0)
Platelets: 253 10*3/uL (ref 150–400)
RBC: 4.71 MIL/uL (ref 3.87–5.11)
RDW: 15.4 % (ref 11.5–15.5)
WBC: 11.3 10*3/uL — ABNORMAL HIGH (ref 4.0–10.5)
nRBC: 0 % (ref 0.0–0.2)

## 2018-10-08 LAB — TYPE AND SCREEN
ABO/RH(D): B POS
Antibody Screen: NEGATIVE

## 2018-10-08 LAB — SURGICAL PCR SCREEN
MRSA, PCR: NEGATIVE
Staphylococcus aureus: NEGATIVE

## 2018-10-08 NOTE — Progress Notes (Signed)
PCP - Dr. Clayborne Dana Cardiologist - denies   Chest x-ray - denies EKG - 11/01/17 Stress Test - 11/21/17 ECHO - denies Cardiac Cath - denies  Sleep Study -  YES CPAP -  Per patient " haven't worn in about a month due to having to spit up through out the night and cough"  Fasting Blood Sugar - 150-515 Checks Blood Sugar 4X/day  Blood Thinner Instructions: N/A Aspirin Instructions: N/A  Anesthesia review: YES, Abnormal EKG  Coronavirus Screening  Have you experienced the following symptoms:  Cough yes/no: No Fever (>100.38F)  yes/no: No Runny nose yes/no: No Sore throat yes/no: No Difficulty breathing/shortness of breath  yes/no: No  Have you or a family member traveled in the last 14 days and where? yes/no: No  If the patient indicates "YES" to the above questions, their PAT will be rescheduled to limit the exposure to others and, the surgeon will be notified. THE PATIENT WILL NEED TO BE ASYMPTOMATIC FOR 14 DAYS.   If the patient is not experiencing any of these symptoms, the PAT nurse will instruct them to NOT bring anyone with them to their appointment since they may have these symptoms or traveled as well.   Please remind your patients and families that hospital visitation restrictions are in effect and the importance of the restrictions.   Patient denies shortness of breath, fever, cough and chest pain at PAT appointment  Patient verbalized understanding of instructions that were given to them at the PAT appointment. Patient was also instructed that they will need to review over the PAT instructions again at home before surgery.

## 2018-10-10 ENCOUNTER — Other Ambulatory Visit (HOSPITAL_COMMUNITY)
Admission: RE | Admit: 2018-10-10 | Discharge: 2018-10-10 | Disposition: A | Discharge status: Home / Self Care | Payer: Medicare Other | Source: Ambulatory Visit | Attending: Neurosurgery | Admitting: Neurosurgery

## 2018-10-10 DIAGNOSIS — Z01812 Encounter for preprocedural laboratory examination: Secondary | ICD-10-CM | POA: Diagnosis not present

## 2018-10-10 LAB — SARS CORONAVIRUS 2 (TAT 6-24 HRS): SARS Coronavirus 2: NEGATIVE

## 2018-10-10 NOTE — Anesthesia Preprocedure Evaluation (Addendum)
Anesthesia Evaluation  Patient identified by MRN, date of birth, ID band Patient awake    Reviewed: Allergy & Precautions, NPO status , Patient's Chart, lab work & pertinent test results  Airway Mallampati: III  TM Distance: >3 FB Neck ROM: Full    Dental no notable dental hx.    Pulmonary asthma , sleep apnea , COPD, Current Smoker and Patient abstained from smoking.,    Pulmonary exam normal breath sounds clear to auscultation       Cardiovascular hypertension, Normal cardiovascular exam Rhythm:Regular Rate:Normal     Neuro/Psych negative neurological ROS  negative psych ROS   GI/Hepatic Neg liver ROS, GERD  ,  Endo/Other  diabetesHypothyroidism Morbid obesity  Renal/GU negative Renal ROS  negative genitourinary   Musculoskeletal negative musculoskeletal ROS (+)   Abdominal   Peds negative pediatric ROS (+)  Hematology negative hematology ROS (+)   Anesthesia Other Findings   Reproductive/Obstetrics negative OB ROS                            Anesthesia Physical Anesthesia Plan  ASA: III  Anesthesia Plan: General   Post-op Pain Management:    Induction: Intravenous  PONV Risk Score and Plan: 3 and Ondansetron, Dexamethasone and Treatment may vary due to age or medical condition  Airway Management Planned: Oral ETT  Additional Equipment:   Intra-op Plan:   Post-operative Plan: Extubation in OR  Informed Consent: I have reviewed the patients History and Physical, chart, labs and discussed the procedure including the risks, benefits and alternatives for the proposed anesthesia with the patient or authorized representative who has indicated his/her understanding and acceptance.     Dental advisory given  Plan Discussed with: CRNA and Surgeon  Anesthesia Plan Comments: (Uncontrolled IDDMII, A1c 10.6. called to Dr. Donnella Bi office, he advised to proceed pending BG on DOS.    Surgery was previously scheduled for September 2019 but was postponed due to PCP having ordered nuclear stress that pt had not yet completed. In the interim pt has had stress test and it was nonischemic with EF 67%. She was subsequently cleared by PCP Dr. Glendon Axe as moderate risk.  Surgery was again postponed in August 2020 due to pt having URI symptoms. Symptoms have since resolved and Dr. Sherwood Gambler ordered CXR to be done at PAT. CXR 10/29/18 reviewed and shows no acute airspace disease. Covid test 10/28/18 - Negative.  Nuclear stress 11/21/17 (care everywhere): IMPRESSION: 1. No reversible ischemia or infarction. 2. Normal left ventricular wall motion. 3. Left ventricular ejection fraction 67% 4. Non invasive risk stratification*: Low)      Anesthesia Quick Evaluation

## 2018-10-11 ENCOUNTER — Other Ambulatory Visit: Payer: Self-pay | Admitting: Neurosurgery

## 2018-10-11 MED ORDER — DEXTROSE 5 % IV SOLN
3.0000 g | INTRAVENOUS | Status: AC
  Filled 2018-10-11: qty 3000

## 2018-10-28 ENCOUNTER — Other Ambulatory Visit (HOSPITAL_COMMUNITY)
Admission: RE | Admit: 2018-10-28 | Discharge: 2018-10-28 | Disposition: A | Discharge status: Home / Self Care | Payer: Medicare Other | Source: Ambulatory Visit | Attending: Neurosurgery | Admitting: Neurosurgery

## 2018-10-28 DIAGNOSIS — Z01812 Encounter for preprocedural laboratory examination: Secondary | ICD-10-CM | POA: Insufficient documentation

## 2018-10-28 DIAGNOSIS — Z20828 Contact with and (suspected) exposure to other viral communicable diseases: Secondary | ICD-10-CM | POA: Diagnosis not present

## 2018-10-28 NOTE — Progress Notes (Signed)
[facility] [address], [address] [place] [address] Phone: [phone] Fax: [phone]  [facility] [place] [address] [place] [address] Phone: [phone] Fax: [phone]    Your procedure is scheduled on Thursday, September 3rd.  [facility] to [facility] Main Entr[facility] "A" at 5:30 A.M., and check in at the Admitting office.  Call this number if you have problems the morning of surgery:  [phone]  Call [phone] if you have any questions prior to your surgery date Monday-Friday 8am-4pm   Remember:  Do not eat or drink after midnight the night before your surgery   Take these medicines the morning of surgery with A SIP OF WATER atorvastatin (LIPITOR) FLUoxetine (PROZAC) levothyroxine (SYNTHROID, LEVOTHROID)  mometasone (ASMANEX)/ inhaler omeprazole (PRILOSEC)  If needed - albuterol (PROVENTIL HFA;VENTOLIN HFA) 108 (90 Base)- inhaler, mometasone (NASONEX) - nasal spray  7 days prior to surgery STOP taking any Aspirin (unless otherwise instructed by your surgeon), meloxicam (MOBIC), Aleve, Naproxen, Ibuprofen, Motrin, Advil, Goody's, BC's, all herbal medications, fish oil, and all vitamins.  How to Manage Your Diabetes Before and After Surgery  Why is it important to control my blood sugar before and after surgery? . Improving blood sugar levels before and after surgery helps healing and can limit problems. . A way of improving blood sugar control is eating a healthy diet by: o  Eating less sugar and carbohydrates o  Increasing activity/exercise o  Talking with your doctor about reaching your blood sugar goals . High blood sugars (greater than 180 mg/dL) can raise your risk of infections and slow your recovery, so you will need to focus on controlling your diabetes during the weeks before surgery. . Make sure that the doctor who takes care of your diabetes knows about your  planned surgery including the date and location.  How do I manage my blood sugar before surgery? . Check your blood sugar at least 4 times a day, starting 2 days before surgery, to make sure that the level is not too high or low. o Check your blood sugar the morning of your surgery when you wake up and every 2 hours until you get to the Short Stay unit. . If your blood sugar is less than 70 mg/dL, you will need to treat for low blood sugar: o Do not take insulin. o Treat a low blood sugar (less than 70 mg/dL) with  cup of clear juice (cranberry or apple), 4 glucose tablets, OR glucose gel. Recheck blood sugar in 15 minutes after treatment (to make sure it is greater than 70 mg/dL). If your blood sugar is not greater than 70 mg/dL on recheck, call [phone] o  for further instructions. . Report your blood sugar to the short stay nurse when you get to Short Stay.  . If you are admitted to the hospital after surgery: o Your blood sugar will be checked by the staff and you will probably be given insulin after surgery (instead of oral diabetes medicines) to make sure you have good blood sugar levels. o The goal for blood sugar control after surgery is 80-180 mg/dL.  WHAT DO I DO ABOUT MY DIABETES MEDICATION?  Marland Kitchen. Do not take oral diabetes medicines (pills) the morning of surgery.  . THE NIGHT BEFORE SURGERY, take 18 units of insulin glargine (LANTUS)     . THE MORNING OF SURGERY, DO NOT  take insulin aspart (NOVOLOG).  Reviewed and Endorsed by  [facility] Patient Education Committee, August 2015  The Morning of Surgery  Do not wear jewelry, make-up or nail polish.  Do not wear lotions, powders, or perfumes/colognes, or deodorant  Do not shave 48 hours prior to surgery.  Men may shave face and neck.  Do not bring valuables to the hospital.  [facility] is not responsible for any belongings or valuables.  If you are a smoker, DO NOT Smoke 24 hours prior to surgery IF you wear a CPAP at  night please bring your mask, tubing, and machine the morning of surgery   Remember that you must have someone to transport you home after your surgery, and remain with you for 24 hours if you are discharged the same day.  Contacts, glasses, hearing aids, dentures or bridgework may not be worn into surgery.   Leave your suitcase in the car.  After surgery it may be brought to your room.  For patients admitted to the hospital, discharge time will be determined by your treatment team.  Patients discharged the day of surgery will not be allowed to drive home.   Special instructions:   [place]- Preparing For Surgery  Before surgery, you can play an important role. Because skin is not sterile, your skin needs to be as free of germs as possible. You can reduce the number of germs on your skin by washing with CHG (chlorahexidine gluconate) Soap before surgery.  CHG is an antiseptic cleaner which kills germs and bonds with the skin to continue killing germs even after washing.    Oral Hygiene is also important to reduce your risk of infection.  Remember - BRUSH YOUR TEETH THE MORNING OF SURGERY WITH YOUR REGULAR TOOTHPASTE  Please do not use if you have an allergy to CHG or antibacterial soaps. If your skin becomes reddened/irritated stop using the CHG.  Do not shave (including legs and underarms) for at least 48 hours prior to first CHG shower. It is OK to shave your face.  Please follow these instructions carefully.   1. Shower the NIGHT BEFORE SURGERY and the MORNING OF SURGERY with CHG Soap.   2. If you chose to wash your hair, wash your hair first as usual with your normal shampoo.  3. After you shampoo, rinse your hair and body thoroughly to remove the shampoo.  4. Use CHG as you would any other liquid soap. You can apply CHG directly to the skin and wash gently with a scrungie or a clean washcloth.   5. Apply the CHG Soap to your body ONLY FROM THE NECK DOWN.  Do not use on open  wounds or open sores. Avoid contact with your eyes, ears, mouth and genitals (private parts). Wash Face and genitals (private parts)  with your normal soap.   6. Wash thoroughly, paying special attention to the area where your surgery will be performed.  7. Thoroughly rinse your body with warm water from the neck down.  8. DO NOT shower/wash with your normal soap after using and rinsing off the CHG Soap.  9. Pat yourself dry with a CLEAN TOWEL.  10. Wear CLEAN PAJAMAS to bed the night before surgery, wear comfortable clothes the morning of surgery  11. Place CLEAN SHEETS on your bed the night of your first shower and DO NOT SLEEP WITH PETS.  Day of Surgery:  Do not apply any deodorants/lotions. Please shower the morning of surgery with the CHG soap  Please wear clean clothes to the hospital/surgery center.  Remember to brush your teeth WITH YOUR REGULAR TOOTHPASTE.  Please read over the following fact sheets that you were given.

## 2018-10-29 ENCOUNTER — Encounter (HOSPITAL_COMMUNITY)
Admission: RE | Admit: 2018-10-29 | Discharge: 2018-10-29 | Disposition: A | Discharge status: Home / Self Care | Payer: Medicare Other | Source: Ambulatory Visit | Attending: Neurosurgery | Admitting: Neurosurgery

## 2018-10-29 ENCOUNTER — Encounter (HOSPITAL_COMMUNITY): Payer: Self-pay

## 2018-10-29 ENCOUNTER — Other Ambulatory Visit: Payer: Self-pay

## 2018-10-29 ENCOUNTER — Encounter (HOSPITAL_COMMUNITY)
Admission: RE | Admit: 2018-10-29 | Discharge: 2018-10-29 | Disposition: A | Discharge status: Home / Self Care | Payer: Medicare Other | Source: Ambulatory Visit | Attending: Anesthesiology | Admitting: Anesthesiology

## 2018-10-29 ENCOUNTER — Ambulatory Visit (HOSPITAL_COMMUNITY): Admission: RE | Admit: 2018-10-29 | Payer: Medicare Other | Source: Ambulatory Visit

## 2018-10-29 DIAGNOSIS — K219 Gastro-esophageal reflux disease without esophagitis: Secondary | ICD-10-CM | POA: Diagnosis not present

## 2018-10-29 DIAGNOSIS — E785 Hyperlipidemia, unspecified: Secondary | ICD-10-CM | POA: Diagnosis not present

## 2018-10-29 DIAGNOSIS — Z6841 Body Mass Index (BMI) 40.0 and over, adult: Secondary | ICD-10-CM | POA: Diagnosis not present

## 2018-10-29 DIAGNOSIS — Z79890 Hormone replacement therapy (postmenopausal): Secondary | ICD-10-CM | POA: Diagnosis not present

## 2018-10-29 DIAGNOSIS — J302 Other seasonal allergic rhinitis: Secondary | ICD-10-CM | POA: Diagnosis not present

## 2018-10-29 DIAGNOSIS — Z79899 Other long term (current) drug therapy: Secondary | ICD-10-CM | POA: Diagnosis not present

## 2018-10-29 DIAGNOSIS — F17210 Nicotine dependence, cigarettes, uncomplicated: Secondary | ICD-10-CM | POA: Diagnosis not present

## 2018-10-29 DIAGNOSIS — Z791 Long term (current) use of non-steroidal anti-inflammatories (NSAID): Secondary | ICD-10-CM | POA: Diagnosis not present

## 2018-10-29 DIAGNOSIS — M4722 Other spondylosis with radiculopathy, cervical region: Secondary | ICD-10-CM | POA: Diagnosis not present

## 2018-10-29 DIAGNOSIS — I110 Hypertensive heart disease with heart failure: Secondary | ICD-10-CM | POA: Diagnosis not present

## 2018-10-29 DIAGNOSIS — M50122 Cervical disc disorder at C5-C6 level with radiculopathy: Secondary | ICD-10-CM | POA: Diagnosis present

## 2018-10-29 DIAGNOSIS — I509 Heart failure, unspecified: Secondary | ICD-10-CM | POA: Diagnosis not present

## 2018-10-29 DIAGNOSIS — Z794 Long term (current) use of insulin: Secondary | ICD-10-CM | POA: Diagnosis not present

## 2018-10-29 DIAGNOSIS — E039 Hypothyroidism, unspecified: Secondary | ICD-10-CM | POA: Diagnosis not present

## 2018-10-29 DIAGNOSIS — J449 Chronic obstructive pulmonary disease, unspecified: Secondary | ICD-10-CM | POA: Diagnosis not present

## 2018-10-29 DIAGNOSIS — R059 Cough, unspecified: Secondary | ICD-10-CM

## 2018-10-29 DIAGNOSIS — E119 Type 2 diabetes mellitus without complications: Secondary | ICD-10-CM | POA: Diagnosis not present

## 2018-10-29 DIAGNOSIS — G4730 Sleep apnea, unspecified: Secondary | ICD-10-CM | POA: Diagnosis not present

## 2018-10-29 DIAGNOSIS — F329 Major depressive disorder, single episode, unspecified: Secondary | ICD-10-CM | POA: Diagnosis not present

## 2018-10-29 DIAGNOSIS — R05 Cough: Secondary | ICD-10-CM

## 2018-10-29 DIAGNOSIS — Z01818 Encounter for other preprocedural examination: Secondary | ICD-10-CM

## 2018-10-29 HISTORY — DX: Hypothyroidism, unspecified: E03.9

## 2018-10-29 HISTORY — DX: Follicular cyst of the skin and subcutaneous tissue, unspecified: L72.9

## 2018-10-29 LAB — CBC WITH DIFFERENTIAL/PLATELET
Abs Immature Granulocytes: 0.03 10*3/uL (ref 0.00–0.07)
Basophils Absolute: 0 10*3/uL (ref 0.0–0.1)
Basophils Relative: 0 %
Eosinophils Absolute: 0.1 10*3/uL (ref 0.0–0.5)
Eosinophils Relative: 1 %
HCT: 47.6 % — ABNORMAL HIGH (ref 36.0–46.0)
Hemoglobin: 15 g/dL (ref 12.0–15.0)
Immature Granulocytes: 0 %
Lymphocytes Relative: 23 %
Lymphs Abs: 2 10*3/uL (ref 0.7–4.0)
MCH: 30.9 pg (ref 26.0–34.0)
MCHC: 31.5 g/dL (ref 30.0–36.0)
MCV: 97.9 fL (ref 80.0–100.0)
Monocytes Absolute: 0.5 10*3/uL (ref 0.1–1.0)
Monocytes Relative: 5 %
Neutro Abs: 6.2 10*3/uL (ref 1.7–7.7)
Neutrophils Relative %: 71 %
Platelets: 295 10*3/uL (ref 150–400)
RBC: 4.86 MIL/uL (ref 3.87–5.11)
RDW: 15.3 % (ref 11.5–15.5)
WBC: 8.9 10*3/uL (ref 4.0–10.5)
nRBC: 0 % (ref 0.0–0.2)

## 2018-10-29 LAB — URINALYSIS, ROUTINE W REFLEX MICROSCOPIC
Bilirubin Urine: NEGATIVE
Glucose, UA: NEGATIVE mg/dL
Hgb urine dipstick: NEGATIVE
Ketones, ur: NEGATIVE mg/dL
Nitrite: NEGATIVE
Protein, ur: NEGATIVE mg/dL
Specific Gravity, Urine: 1.018 (ref 1.005–1.030)
pH: 5 (ref 5.0–8.0)

## 2018-10-29 LAB — BASIC METABOLIC PANEL
Anion gap: 14 (ref 5–15)
BUN: 9 mg/dL (ref 6–20)
CO2: 28 mmol/L (ref 22–32)
Calcium: 9.6 mg/dL (ref 8.9–10.3)
Chloride: 98 mmol/L (ref 98–111)
Creatinine, Ser: 0.9 mg/dL (ref 0.44–1.00)
GFR calc Af Amer: >60 mL/min (ref >60)
GFR calc non Af Amer: >60 mL/min (ref >60)
Glucose, Bld: 132 mg/dL — ABNORMAL HIGH (ref 70–99)
Potassium: 3.8 mmol/L (ref 3.5–5.1)
Sodium: 140 mmol/L (ref 135–145)

## 2018-10-29 LAB — GLUCOSE, CAPILLARY: Glucose-Capillary: 132 mg/dL — ABNORMAL HIGH (ref 70–99)

## 2018-10-29 LAB — SARS CORONAVIRUS 2 (TAT 6-24 HRS): SARS Coronavirus 2: NEGATIVE

## 2018-10-29 MED ORDER — CHLORHEXIDINE GLUCONATE CLOTH 2 % EX PADS: 6.0000 | MEDICATED_PAD | Freq: Once | CUTANEOUS | Status: DC

## 2018-10-29 NOTE — Progress Notes (Signed)
PCP - C  SMITH Cardiologist - [facility]  Chest x-ray - 10/29/18     PA/LAT EKG -9/19  Stress Test - 9/19   C  Sleep Study - YES CPAP - STOPPED WEARING,DRY MOUTH  Fasting Blood Sugar - 132Checks Blood Sugar _DAILY____ times a day   Aspirin Instructions:STOP  Anesthesia review: [place]  Patient denies shortness of breath, fever, cough and chest pain at PAT appointment   Patient verbalized understanding of instructions that were given to them at the PAT appointment. Patient was also instructed that they will need to review over the PAT instructions again at home before surgery.

## 2018-10-30 MED ORDER — DEXTROSE 5 % IV SOLN
3.0000 g | INTRAVENOUS | Status: AC
  Administered 2018-10-31: 08:00:00 3 g via INTRAVENOUS
  Filled 2018-10-30: qty 3
  Filled 2018-10-30: qty 3000

## 2018-10-31 ENCOUNTER — Ambulatory Visit (HOSPITAL_COMMUNITY): Payer: Medicare Other | Admitting: Physician Assistant

## 2018-10-31 ENCOUNTER — Observation Stay (HOSPITAL_COMMUNITY)
Admission: RE | Admit: 2018-10-31 | Discharge: 2018-11-01 | Disposition: A | Discharge status: Home / Self Care | Payer: Medicare Other | Attending: Neurosurgery | Admitting: Neurosurgery

## 2018-10-31 ENCOUNTER — Encounter (HOSPITAL_COMMUNITY)
Admission: RE | Disposition: A | Discharge status: Home / Self Care | Payer: Self-pay | Source: Home / Self Care | Attending: Neurosurgery

## 2018-10-31 ENCOUNTER — Ambulatory Visit (HOSPITAL_COMMUNITY): Payer: Medicare Other

## 2018-10-31 ENCOUNTER — Other Ambulatory Visit: Payer: Self-pay

## 2018-10-31 ENCOUNTER — Encounter (HOSPITAL_COMMUNITY): Payer: Self-pay | Admitting: General Practice

## 2018-10-31 DIAGNOSIS — E119 Type 2 diabetes mellitus without complications: Secondary | ICD-10-CM | POA: Insufficient documentation

## 2018-10-31 DIAGNOSIS — M50122 Cervical disc disorder at C5-C6 level with radiculopathy: Principal | ICD-10-CM | POA: Insufficient documentation

## 2018-10-31 DIAGNOSIS — Z791 Long term (current) use of non-steroidal anti-inflammatories (NSAID): Secondary | ICD-10-CM | POA: Insufficient documentation

## 2018-10-31 DIAGNOSIS — I110 Hypertensive heart disease with heart failure: Secondary | ICD-10-CM | POA: Insufficient documentation

## 2018-10-31 DIAGNOSIS — M4722 Other spondylosis with radiculopathy, cervical region: Secondary | ICD-10-CM | POA: Diagnosis not present

## 2018-10-31 DIAGNOSIS — F329 Major depressive disorder, single episode, unspecified: Secondary | ICD-10-CM | POA: Insufficient documentation

## 2018-10-31 DIAGNOSIS — J302 Other seasonal allergic rhinitis: Secondary | ICD-10-CM | POA: Insufficient documentation

## 2018-10-31 DIAGNOSIS — G4730 Sleep apnea, unspecified: Secondary | ICD-10-CM | POA: Insufficient documentation

## 2018-10-31 DIAGNOSIS — E039 Hypothyroidism, unspecified: Secondary | ICD-10-CM | POA: Insufficient documentation

## 2018-10-31 DIAGNOSIS — J449 Chronic obstructive pulmonary disease, unspecified: Secondary | ICD-10-CM | POA: Insufficient documentation

## 2018-10-31 DIAGNOSIS — Z79899 Other long term (current) drug therapy: Secondary | ICD-10-CM | POA: Insufficient documentation

## 2018-10-31 DIAGNOSIS — Z419 Encounter for procedure for purposes other than remedying health state, unspecified: Secondary | ICD-10-CM

## 2018-10-31 DIAGNOSIS — Z6841 Body Mass Index (BMI) 40.0 and over, adult: Secondary | ICD-10-CM | POA: Insufficient documentation

## 2018-10-31 DIAGNOSIS — K219 Gastro-esophageal reflux disease without esophagitis: Secondary | ICD-10-CM | POA: Insufficient documentation

## 2018-10-31 DIAGNOSIS — F17210 Nicotine dependence, cigarettes, uncomplicated: Secondary | ICD-10-CM | POA: Insufficient documentation

## 2018-10-31 DIAGNOSIS — M5020 Other cervical disc displacement, unspecified cervical region: Secondary | ICD-10-CM | POA: Diagnosis present

## 2018-10-31 DIAGNOSIS — Z794 Long term (current) use of insulin: Secondary | ICD-10-CM | POA: Insufficient documentation

## 2018-10-31 DIAGNOSIS — E785 Hyperlipidemia, unspecified: Secondary | ICD-10-CM | POA: Insufficient documentation

## 2018-10-31 DIAGNOSIS — Z79890 Hormone replacement therapy (postmenopausal): Secondary | ICD-10-CM | POA: Insufficient documentation

## 2018-10-31 DIAGNOSIS — I509 Heart failure, unspecified: Secondary | ICD-10-CM | POA: Insufficient documentation

## 2018-10-31 HISTORY — PX: ANTERIOR CERVICAL DECOMP/DISCECTOMY FUSION: SHX1161

## 2018-10-31 LAB — GLUCOSE, CAPILLARY
Glucose-Capillary: 195 mg/dL — ABNORMAL HIGH (ref 70–99)
Glucose-Capillary: 198 mg/dL — ABNORMAL HIGH (ref 70–99)
Glucose-Capillary: 215 mg/dL — ABNORMAL HIGH (ref 70–99)
Glucose-Capillary: 215 mg/dL — ABNORMAL HIGH (ref 70–99)

## 2018-10-31 SURGERY — ANTERIOR CERVICAL DECOMPRESSION/DISCECTOMY FUSION 2 LEVELS
Anesthesia: General | Site: Spine Cervical

## 2018-10-31 MED ORDER — ONDANSETRON HCL 4 MG/2ML IJ SOLN: 4.0000 mg | Freq: Four times a day (QID) | INTRAMUSCULAR | Status: DC | PRN

## 2018-10-31 MED ORDER — SODIUM CHLORIDE 0.9 % IV SOLN
INTRAVENOUS | Status: DC | PRN
  Administered 2018-10-31: 08:00:00 500 mL

## 2018-10-31 MED ORDER — HYDROMORPHONE HCL 1 MG/ML IJ SOLN
INTRAMUSCULAR | Status: AC
  Filled 2018-10-31: qty 1

## 2018-10-31 MED ORDER — INSULIN ASPART 100 UNIT/ML ~~LOC~~ SOLN
0.0000 [IU] | Freq: Three times a day (TID) | SUBCUTANEOUS | Status: DC
  Administered 2018-10-31 – 2018-11-01 (×2): 5 [IU] via SUBCUTANEOUS

## 2018-10-31 MED ORDER — MENTHOL 3 MG MT LOZG: 1.0000 | LOZENGE | OROMUCOSAL | Status: DC | PRN

## 2018-10-31 MED ORDER — MAGNESIUM HYDROXIDE 400 MG/5ML PO SUSP: 30.0000 mL | Freq: Every day | ORAL | Status: DC | PRN

## 2018-10-31 MED ORDER — HYDROXYZINE HCL 50 MG/ML IM SOLN: 50.0000 mg | INTRAMUSCULAR | Status: DC | PRN

## 2018-10-31 MED ORDER — LIDOCAINE 2% (20 MG/ML) 5 ML SYRINGE
INTRAMUSCULAR | Status: DC | PRN
  Administered 2018-10-31: 100 mg via INTRAVENOUS

## 2018-10-31 MED ORDER — INSULIN ASPART 100 UNIT/ML ~~LOC~~ SOLN
5.0000 [IU] | Freq: Three times a day (TID) | SUBCUTANEOUS | Status: DC
  Administered 2018-10-31 – 2018-11-01 (×2): 5 [IU] via SUBCUTANEOUS

## 2018-10-31 MED ORDER — SODIUM CHLORIDE 0.9% FLUSH
3.0000 mL | Freq: Two times a day (BID) | INTRAVENOUS | Status: DC
  Administered 2018-10-31: 3 mL via INTRAVENOUS

## 2018-10-31 MED ORDER — BUPIVACAINE HCL (PF) 0.5 % IJ SOLN
INTRAMUSCULAR | Status: AC
  Filled 2018-10-31: qty 30

## 2018-10-31 MED ORDER — ACETAMINOPHEN 325 MG PO TABS: 650.0000 mg | ORAL_TABLET | ORAL | Status: DC | PRN

## 2018-10-31 MED ORDER — OXYCODONE HCL 5 MG PO TABS
5.0000 mg | ORAL_TABLET | ORAL | Status: DC | PRN
  Administered 2018-10-31 – 2018-11-01 (×4): 10 mg via ORAL
  Filled 2018-10-31 (×4): qty 2

## 2018-10-31 MED ORDER — ONDANSETRON HCL 4 MG/2ML IJ SOLN
INTRAMUSCULAR | Status: DC | PRN
  Administered 2018-10-31: 4 mg via INTRAVENOUS

## 2018-10-31 MED ORDER — IPRATROPIUM-ALBUTEROL 0.5-2.5 (3) MG/3ML IN SOLN: 3.0000 mL | Freq: Four times a day (QID) | RESPIRATORY_TRACT | Status: DC | PRN

## 2018-10-31 MED ORDER — KETOROLAC TROMETHAMINE 30 MG/ML IJ SOLN
30.0000 mg | Freq: Once | INTRAMUSCULAR | Status: AC
  Administered 2018-10-31: 11:00:00 30 mg via INTRAVENOUS

## 2018-10-31 MED ORDER — FENTANYL CITRATE (PF) 250 MCG/5ML IJ SOLN
INTRAMUSCULAR | Status: AC
  Filled 2018-10-31: qty 5

## 2018-10-31 MED ORDER — TRAZODONE HCL 100 MG PO TABS
100.0000 mg | ORAL_TABLET | Freq: Every day | ORAL | Status: DC
  Administered 2018-10-31: 100 mg via ORAL
  Filled 2018-10-31: qty 1

## 2018-10-31 MED ORDER — PROPOFOL 10 MG/ML IV BOLUS
INTRAVENOUS | Status: AC
  Filled 2018-10-31: qty 20

## 2018-10-31 MED ORDER — FENTANYL CITRATE (PF) 250 MCG/5ML IJ SOLN
INTRAMUSCULAR | Status: DC | PRN
  Administered 2018-10-31: 50 ug via INTRAVENOUS
  Administered 2018-10-31: 100 ug via INTRAVENOUS
  Administered 2018-10-31 (×3): 50 ug via INTRAVENOUS

## 2018-10-31 MED ORDER — MIDAZOLAM HCL 2 MG/2ML IJ SOLN
INTRAMUSCULAR | Status: AC
  Filled 2018-10-31: qty 2

## 2018-10-31 MED ORDER — THROMBIN 5000 UNITS EX SOLR
OROMUCOSAL | Status: DC | PRN
  Administered 2018-10-31: 5 mL via TOPICAL

## 2018-10-31 MED ORDER — ONDANSETRON HCL 4 MG PO TABS: 4.0000 mg | ORAL_TABLET | Freq: Four times a day (QID) | ORAL | Status: DC | PRN

## 2018-10-31 MED ORDER — LEVOTHYROXINE SODIUM 125 MCG PO TABS
125.0000 ug | ORAL_TABLET | Freq: Every day | ORAL | Status: DC
  Administered 2018-11-01: 125 ug via ORAL
  Filled 2018-10-31: qty 1

## 2018-10-31 MED ORDER — SODIUM CHLORIDE 0.9% FLUSH: 3.0000 mL | INTRAVENOUS | Status: DC | PRN

## 2018-10-31 MED ORDER — DEXAMETHASONE SODIUM PHOSPHATE 10 MG/ML IJ SOLN
INTRAMUSCULAR | Status: DC | PRN
  Administered 2018-10-31: 4 mg via INTRAVENOUS

## 2018-10-31 MED ORDER — FLUTICASONE PROPIONATE 50 MCG/ACT NA SUSP
2.0000 | Freq: Every day | NASAL | Status: DC
  Filled 2018-10-31: qty 16

## 2018-10-31 MED ORDER — BISACODYL 10 MG RE SUPP: 10.0000 mg | Freq: Every day | RECTAL | Status: DC | PRN

## 2018-10-31 MED ORDER — DEXAMETHASONE SODIUM PHOSPHATE 10 MG/ML IJ SOLN
INTRAMUSCULAR | Status: AC
  Filled 2018-10-31: qty 1

## 2018-10-31 MED ORDER — ACETAMINOPHEN 10 MG/ML IV SOLN
INTRAVENOUS | Status: AC
  Filled 2018-10-31: qty 100

## 2018-10-31 MED ORDER — HYDROXYZINE HCL 25 MG PO TABS: 50.0000 mg | ORAL_TABLET | ORAL | Status: DC | PRN

## 2018-10-31 MED ORDER — THROMBIN 5000 UNITS EX SOLR
CUTANEOUS | Status: AC
  Filled 2018-10-31: qty 5000

## 2018-10-31 MED ORDER — POTASSIUM CHLORIDE IN NACL 20-0.9 MEQ/L-% IV SOLN: INTRAVENOUS | Status: DC

## 2018-10-31 MED ORDER — THROMBIN 20000 UNITS EX SOLR
CUTANEOUS | Status: DC | PRN
  Administered 2018-10-31: 08:00:00 20 mL via TOPICAL

## 2018-10-31 MED ORDER — PROMETHAZINE HCL 25 MG/ML IJ SOLN: 6.2500 mg | INTRAMUSCULAR | Status: DC | PRN

## 2018-10-31 MED ORDER — KETOROLAC TROMETHAMINE 30 MG/ML IJ SOLN
INTRAMUSCULAR | Status: AC
  Administered 2018-10-31: 30 mg via INTRAVENOUS
  Filled 2018-10-31: qty 1

## 2018-10-31 MED ORDER — CLINDAMYCIN HCL 300 MG PO CAPS
300.0000 mg | ORAL_CAPSULE | Freq: Three times a day (TID) | ORAL | Status: DC
  Administered 2018-10-31 (×2): 300 mg via ORAL
  Filled 2018-10-31 (×4): qty 1

## 2018-10-31 MED ORDER — ATORVASTATIN CALCIUM 40 MG PO TABS
40.0000 mg | ORAL_TABLET | Freq: Every day | ORAL | Status: DC
  Administered 2018-10-31: 40 mg via ORAL
  Filled 2018-10-31: qty 1

## 2018-10-31 MED ORDER — MIDAZOLAM HCL 5 MG/5ML IJ SOLN
INTRAMUSCULAR | Status: DC | PRN
  Administered 2018-10-31: 2 mg via INTRAVENOUS

## 2018-10-31 MED ORDER — LIDOCAINE-EPINEPHRINE 1 %-1:100000 IJ SOLN
INTRAMUSCULAR | Status: DC | PRN
  Administered 2018-10-31: 5 mL

## 2018-10-31 MED ORDER — CYCLOBENZAPRINE HCL 5 MG PO TABS
5.0000 mg | ORAL_TABLET | Freq: Three times a day (TID) | ORAL | Status: DC | PRN
  Administered 2018-10-31: 5 mg via ORAL
  Filled 2018-10-31: qty 1

## 2018-10-31 MED ORDER — ROCURONIUM BROMIDE 10 MG/ML (PF) SYRINGE
PREFILLED_SYRINGE | INTRAVENOUS | Status: DC | PRN
  Administered 2018-10-31: 40 mg via INTRAVENOUS
  Administered 2018-10-31: 20 mg via INTRAVENOUS

## 2018-10-31 MED ORDER — INSULIN GLARGINE 100 UNIT/ML ~~LOC~~ SOLN
40.0000 [IU] | Freq: Every day | SUBCUTANEOUS | Status: DC
  Administered 2018-10-31: 40 [IU] via SUBCUTANEOUS
  Filled 2018-10-31 (×3): qty 0.4

## 2018-10-31 MED ORDER — ACETAMINOPHEN 10 MG/ML IV SOLN
INTRAVENOUS | Status: DC | PRN
  Administered 2018-10-31: 1000 mg via INTRAVENOUS

## 2018-10-31 MED ORDER — HYDROMORPHONE HCL 1 MG/ML IJ SOLN
INTRAMUSCULAR | Status: AC
  Administered 2018-10-31: 0.25 mg via INTRAVENOUS
  Filled 2018-10-31: qty 1

## 2018-10-31 MED ORDER — SODIUM CHLORIDE 0.9 % IV SOLN: 250.0000 mL | INTRAVENOUS | Status: DC

## 2018-10-31 MED ORDER — LIDOCAINE 2% (20 MG/ML) 5 ML SYRINGE
INTRAMUSCULAR | Status: AC
  Filled 2018-10-31: qty 10

## 2018-10-31 MED ORDER — SODIUM CHLORIDE 0.9 % IV SOLN
INTRAVENOUS | Status: DC | PRN
  Administered 2018-10-31: 50 ug/min via INTRAVENOUS

## 2018-10-31 MED ORDER — HYDROMORPHONE HCL 1 MG/ML IJ SOLN
0.2500 mg | INTRAMUSCULAR | Status: DC | PRN
  Administered 2018-10-31: 11:00:00 0.25 mg via INTRAVENOUS

## 2018-10-31 MED ORDER — OXYCODONE HCL 5 MG/5ML PO SOLN: 5.0000 mg | Freq: Once | ORAL | Status: DC | PRN

## 2018-10-31 MED ORDER — LIDOCAINE-EPINEPHRINE 1 %-1:100000 IJ SOLN
INTRAMUSCULAR | Status: AC
  Filled 2018-10-31: qty 1

## 2018-10-31 MED ORDER — MORPHINE SULFATE (PF) 4 MG/ML IV SOLN: 4.0000 mg | INTRAVENOUS | Status: DC | PRN

## 2018-10-31 MED ORDER — FLEET ENEMA 7-19 GM/118ML RE ENEM: 1.0000 | ENEMA | Freq: Once | RECTAL | Status: DC | PRN

## 2018-10-31 MED ORDER — BUPIVACAINE HCL (PF) 0.5 % IJ SOLN
INTRAMUSCULAR | Status: DC | PRN
  Administered 2018-10-31: 5 mL

## 2018-10-31 MED ORDER — ONDANSETRON HCL 4 MG/2ML IJ SOLN
INTRAMUSCULAR | Status: AC
  Filled 2018-10-31: qty 2

## 2018-10-31 MED ORDER — FLUCONAZOLE 150 MG PO TABS
150.0000 mg | ORAL_TABLET | Freq: Once | ORAL | Status: AC
  Administered 2018-10-31: 150 mg via ORAL
  Filled 2018-10-31: qty 1

## 2018-10-31 MED ORDER — HYDROCHLOROTHIAZIDE 25 MG PO TABS: 25.0000 mg | ORAL_TABLET | Freq: Every day | ORAL | Status: DC

## 2018-10-31 MED ORDER — PANTOPRAZOLE SODIUM 40 MG PO TBEC: 40.0000 mg | DELAYED_RELEASE_TABLET | Freq: Every day | ORAL | Status: DC

## 2018-10-31 MED ORDER — THROMBIN 20000 UNITS EX SOLR
CUTANEOUS | Status: AC
  Filled 2018-10-31: qty 20000

## 2018-10-31 MED ORDER — LACTATED RINGERS IV SOLN
INTRAVENOUS | Status: DC | PRN
  Administered 2018-10-31: 07:00:00 via INTRAVENOUS

## 2018-10-31 MED ORDER — SUCCINYLCHOLINE CHLORIDE 200 MG/10ML IV SOSY
PREFILLED_SYRINGE | INTRAVENOUS | Status: AC
  Filled 2018-10-31: qty 10

## 2018-10-31 MED ORDER — KETOROLAC TROMETHAMINE 30 MG/ML IJ SOLN
30.0000 mg | Freq: Four times a day (QID) | INTRAMUSCULAR | Status: DC
  Administered 2018-10-31 – 2018-11-01 (×3): 30 mg via INTRAVENOUS
  Filled 2018-10-31 (×3): qty 1

## 2018-10-31 MED ORDER — SUCCINYLCHOLINE CHLORIDE 200 MG/10ML IV SOSY
PREFILLED_SYRINGE | INTRAVENOUS | Status: DC | PRN
  Administered 2018-10-31: 180 mg via INTRAVENOUS

## 2018-10-31 MED ORDER — 0.9 % SODIUM CHLORIDE (POUR BTL) OPTIME
TOPICAL | Status: DC | PRN
  Administered 2018-10-31: 1000 mL

## 2018-10-31 MED ORDER — PROPOFOL 10 MG/ML IV BOLUS
INTRAVENOUS | Status: DC | PRN
  Administered 2018-10-31: 180 mg via INTRAVENOUS

## 2018-10-31 MED ORDER — ROCURONIUM BROMIDE 10 MG/ML (PF) SYRINGE
PREFILLED_SYRINGE | INTRAVENOUS | Status: AC
  Filled 2018-10-31: qty 20

## 2018-10-31 MED ORDER — GABAPENTIN 300 MG PO CAPS
300.0000 mg | ORAL_CAPSULE | Freq: Every day | ORAL | Status: DC
  Administered 2018-10-31: 300 mg via ORAL
  Filled 2018-10-31: qty 1

## 2018-10-31 MED ORDER — PHENOL 1.4 % MT LIQD: 1.0000 | OROMUCOSAL | Status: DC | PRN

## 2018-10-31 MED ORDER — OXYCODONE HCL 5 MG PO TABS: 5.0000 mg | ORAL_TABLET | Freq: Once | ORAL | Status: DC | PRN

## 2018-10-31 MED ORDER — ALUM & MAG HYDROXIDE-SIMETH 200-200-20 MG/5ML PO SUSP: 30.0000 mL | Freq: Four times a day (QID) | ORAL | Status: DC | PRN

## 2018-10-31 MED ORDER — ACETAMINOPHEN 650 MG RE SUPP: 650.0000 mg | RECTAL | Status: DC | PRN

## 2018-10-31 MED ORDER — FLUOXETINE HCL 20 MG PO CAPS: 20.0000 mg | ORAL_CAPSULE | Freq: Every day | ORAL | Status: DC

## 2018-10-31 MED ORDER — INSULIN ASPART 100 UNIT/ML ~~LOC~~ SOLN
0.0000 [IU] | Freq: Every day | SUBCUTANEOUS | Status: DC
  Administered 2018-10-31: 2 [IU] via SUBCUTANEOUS

## 2018-10-31 SURGICAL SUPPLY — 63 items
ALLOGRAFT CA 6X14X11 (Bone Implant) ×4 IMPLANT
BAG DECANTER FOR FLEXI CONT (MISCELLANEOUS) ×2 IMPLANT
BENZOIN TINCTURE PRP APPL 2/3 (GAUZE/BANDAGES/DRESSINGS) ×2 IMPLANT
BIT DRILL 12X2.5XAVTR (BIT) IMPLANT
BIT DRILL AVIATOR 12 (BIT)
BIT DRILL NEURO 2X3.1 SFT TUCH (MISCELLANEOUS) ×1 IMPLANT
BIT DRL 12X2.5XAVTR (BIT)
BLADE ULTRA TIP 2M (BLADE) IMPLANT
CANISTER SUCT 3000ML PPV (MISCELLANEOUS) ×2 IMPLANT
CARTRIDGE OIL MAESTRO DRILL (MISCELLANEOUS) ×1 IMPLANT
COLLAR CERV LO CONTOUR FIRM DE (SOFTGOODS) ×2 IMPLANT
COVER MAYO STAND STRL (DRAPES) ×2 IMPLANT
COVER WAND RF STERILE (DRAPES) ×2 IMPLANT
DECANTER SPIKE VIAL GLASS SM (MISCELLANEOUS) ×2 IMPLANT
DERMABOND ADVANCED (GAUZE/BANDAGES/DRESSINGS) ×1
DERMABOND ADVANCED .7 DNX12 (GAUZE/BANDAGES/DRESSINGS) ×1 IMPLANT
DIFFUSER DRILL AIR PNEUMATIC (MISCELLANEOUS) ×2 IMPLANT
DRAPE HALF SHEET 40X57 (DRAPES) ×2 IMPLANT
DRAPE LAPAROTOMY 100X72 PEDS (DRAPES) ×2 IMPLANT
DRAPE MICROSCOPE LEICA (MISCELLANEOUS) ×2 IMPLANT
DRAPE POUCH INSTRU U-SHP 10X18 (DRAPES) ×2 IMPLANT
DRILL BIT 12MM ×2 IMPLANT
DRILL NEURO 2X3.1 SOFT TOUCH (MISCELLANEOUS) ×2
ELECT COATED BLADE 2.86 ST (ELECTRODE) ×4 IMPLANT
ELECT REM PT RETURN 9FT ADLT (ELECTROSURGICAL) ×2
ELECTRODE REM PT RTRN 9FT ADLT (ELECTROSURGICAL) ×1 IMPLANT
GLOVE BIOGEL PI IND STRL 7.0 (GLOVE) ×1 IMPLANT
GLOVE BIOGEL PI IND STRL 7.5 (GLOVE) ×1 IMPLANT
GLOVE BIOGEL PI IND STRL 8 (GLOVE) ×1 IMPLANT
GLOVE BIOGEL PI INDICATOR 7.0 (GLOVE) ×1
GLOVE BIOGEL PI INDICATOR 7.5 (GLOVE) ×1
GLOVE BIOGEL PI INDICATOR 8 (GLOVE) ×1
GLOVE ECLIPSE 7.5 STRL STRAW (GLOVE) ×2 IMPLANT
GLOVE EXAM NITRILE XL STR (GLOVE) IMPLANT
GLOVE SURG SS PI 7.5 STRL IVOR (GLOVE) ×6 IMPLANT
GOWN STRL REUS W/ TWL LRG LVL3 (GOWN DISPOSABLE) IMPLANT
GOWN STRL REUS W/ TWL XL LVL3 (GOWN DISPOSABLE) ×2 IMPLANT
GOWN STRL REUS W/TWL 2XL LVL3 (GOWN DISPOSABLE) IMPLANT
GOWN STRL REUS W/TWL LRG LVL3 (GOWN DISPOSABLE)
GOWN STRL REUS W/TWL XL LVL3 (GOWN DISPOSABLE) ×2
HALTER HD/CHIN CERV TRACTION D (MISCELLANEOUS) ×2 IMPLANT
HEMOSTAT POWDER KIT SURGIFOAM (HEMOSTASIS) ×2 IMPLANT
KIT BASIN OR (CUSTOM PROCEDURE TRAY) ×2 IMPLANT
KIT TURNOVER KIT B (KITS) ×2 IMPLANT
NEEDLE HYPO 25GX1X1/2 BEV (NEEDLE) ×2 IMPLANT
NEEDLE HYPO 25X1 1.5 SAFETY (NEEDLE) ×2 IMPLANT
NEEDLE SPNL 22GX3.5 QUINCKE BK (NEEDLE) ×4 IMPLANT
NS IRRIG 1000ML POUR BTL (IV SOLUTION) ×2 IMPLANT
OIL CARTRIDGE MAESTRO DRILL (MISCELLANEOUS) ×2
PACK LAMINECTOMY NEURO (CUSTOM PROCEDURE TRAY) ×2 IMPLANT
PAD ARMBOARD 7.5X6 YLW CONV (MISCELLANEOUS) ×6 IMPLANT
PLATE AVIATOR ASSY 2LVL SZ 30 (Plate) ×2 IMPLANT
RUBBERBAND STERILE (MISCELLANEOUS) ×4 IMPLANT
SCREW AVIATOR VAR SELFTAP 4X12 (Screw) ×12 IMPLANT
SPONGE INTESTINAL PEANUT (DISPOSABLE) ×2 IMPLANT
SPONGE SURGIFOAM ABS GEL 100 (HEMOSTASIS) ×2 IMPLANT
STAPLER SKIN PROX WIDE 3.9 (STAPLE) IMPLANT
SUT VIC AB 2-0 CP2 18 (SUTURE) ×2 IMPLANT
SUT VIC AB 3-0 SH 8-18 (SUTURE) ×2 IMPLANT
TAPE CLOTH 3X10 TAN LF (GAUZE/BANDAGES/DRESSINGS) ×2 IMPLANT
TOWEL GREEN STERILE (TOWEL DISPOSABLE) ×2 IMPLANT
TOWEL GREEN STERILE FF (TOWEL DISPOSABLE) ×2 IMPLANT
WATER STERILE IRR 1000ML POUR (IV SOLUTION) ×2 IMPLANT

## 2018-10-31 NOTE — Progress Notes (Signed)
Vitals:   10/31/18 1215 10/31/18 1221 10/31/18 1245 10/31/18 1620  BP:   125/80 (!) 165/74  Pulse: 79 80 89 87  Resp: 16 17 20 19   Temp:  45.6 F (25.6 C) 98.1 F (36.7 C) 97.9 F (36.6 C)  TempSrc:   Oral Oral  SpO2: 93% 94% (!) 78% 90%  Weight:      Height:        CBC Recent Labs    10/29/18 1448  WBC 8.9  HGB 15.0  HCT 47.6*  PLT 295   BMET Recent Labs    10/29/18 1448  NA 140  K 3.8  CL 98  CO2 28  GLUCOSE 132*  BUN 9  CREATININE 0.90  CALCIUM 9.6    Patient resting in bed, has ambulated in the halls.  Comfortable.  Wound clean and dry; no erythema, ecchymosis, swelling, or drainage.  Moving all 4 extremities well.  Voiding well.  Plan: Encouraged to ambulate in the halls.  Continue to progress through postoperative recovery.  Hosie Spangle, MD 10/31/2018, 6:12 PM
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
## 2018-10-31 NOTE — Progress Notes (Signed)
Orthopedic Tech Progress Note Patient Details:  [name] [birth year] [id number] Patient has collar Patient ID: [name], female   DOB: 09-Jan-1961, 58 y.o.   MRN: [id number]   [name] 10/31/2018, 12:52 PM

## 2018-10-31 NOTE — Anesthesia Procedure Notes (Signed)
Procedure Name: Intubation Date/Time: 10/31/2018 7:28 AM Performed by: Wilburn Cornelia, CRNA Pre-anesthesia Checklist: Patient identified, Emergency Drugs available, Suction available, Patient being monitored and Timeout performed Patient Re-evaluated:Patient Re-evaluated prior to induction Oxygen Delivery Method: Circle system utilized Preoxygenation: Pre-oxygenation with 100% oxygen Induction Type: IV induction Ventilation: Mask ventilation without difficulty Grade View: Grade I Tube type: Oral Tube size: 7.0 mm Number of attempts: 1 Airway Equipment and Method: Stylet and Video-laryngoscopy Placement Confirmation: ETT inserted through vocal cords under direct vision,  positive ETCO2,  CO2 detector and breath sounds checked- equal and bilateral Secured at: 21 cm Tube secured with: Tape Dental Injury: Teeth and Oropharynx as per pre-operative assessment

## 2018-10-31 NOTE — Transfer of Care (Signed)
Immediate Anesthesia Transfer of Care Note  Patient: [name]  Procedure(s) Performed: ANTERIOR CERVICAL DECOMPRESSION/DISCECTOMY FUSION CERVICAL FIVE- CERVICAL SIX, CERVICAL SIX- CERVICAL SEVEN (N/A Spine Cervical)  Patient Location: PACU  Anesthesia Type:General  Level of Consciousness: awake, alert  and oriented  Airway & Oxygen Therapy: Patient Spontanous Breathing and Patient connected to nasal cannula oxygen  Post-op Assessment: Report given to RN and Post -op Vital signs reviewed and stable  Post vital signs: Reviewed and stable  Last Vitals:  Vitals Value Taken Time  BP 160/97 10/31/18 1034  Temp    Pulse 87 10/31/18 1034  Resp 9 10/31/18 1034  SpO2 91 % 10/31/18 1034  Vitals shown include unvalidated device data.  Last Pain:  Vitals:   10/31/18 0624  PainSc: 9       Patients Stated Pain Goal: 3 (99/24/26 8341)  Complications: No apparent anesthesia complications

## 2018-10-31 NOTE — Anesthesia Postprocedure Evaluation (Signed)
Anesthesia Post Note  Patient: [name]  Procedure(s) Performed: ANTERIOR CERVICAL DECOMPRESSION/DISCECTOMY FUSION CERVICAL FIVE- CERVICAL SIX, CERVICAL SIX- CERVICAL SEVEN (N/A Spine Cervical)     Patient location during evaluation: PACU Anesthesia Type: General Level of consciousness: awake and alert Pain management: pain level controlled Vital Signs Assessment: post-procedure vital signs reviewed and stable Respiratory status: spontaneous breathing, nonlabored ventilation, respiratory function stable and patient connected to nasal cannula oxygen Cardiovascular status: blood pressure returned to baseline and stable Postop Assessment: no apparent nausea or vomiting Anesthetic complications: no    Last Vitals:  Vitals:   10/31/18 0636 10/31/18 1034  BP: (!) 128/50 (!) 160/97  Pulse: 81 88  Resp: 20 (!) 25  Temp: 36.8 C 36.7 C  SpO2: 94% 91%    Last Pain:  Vitals:   10/31/18 0624  PainSc: 9                  Delanee,Vito Beg S

## 2018-10-31 NOTE — Op Note (Signed)
10/31/2018  10:48 AM  PATIENT:  [name]  58 y.o. female  PRE-OPERATIVE DIAGNOSIS: C5-6 and C6-7 spondylitic cervical disc herniation, cervical spondylosis, cervical degenerative disease, bilateral cervical radiculopathy  POST-OPERATIVE DIAGNOSIS:  C5-6 and C6-7 spondylitic cervical disc herniation, cervical spondylosis, cervical degenerative disease, bilateral cervical radiculopathy  PROCEDURE:  Procedure(s): C5-6 and C6-7 anterior cervical decompression and arthrodesis with structural allograft and aviator cervical plating  SURGEON:  Surgeon(s): Jovita Gamma, MD  ANESTHESIA:   general  EBL:  Total I/O In: 1000 [I.V.:1000] Out: 75 [Blood:75]  BLOOD ADMINISTERED:none  COUNT:  Correct per nursing staff  DICTATION: Patient was brought to the operating room placed under general endotracheal anesthesia. Patient was placed in 10 pounds of halter traction. The neck was prepped with Betadine soap and solution and draped in a sterile fashion. A horizontal incision was made on the left side of the neck. The line of the incision was infiltrated with local anesthetic with epinephrine. Dissection was carried down thru the subcutaneous tissue and platysma, bipolar cautery was used to maintain hemostasis. Dissection was then carried out thru an avascular plane leaving the sternocleidomastoid carotid artery and jugular vein laterally and the trachea and esophagus medially. The ventral aspect of the vertebral column was identified and a localizing x-ray was taken.  We were able to identify the C4-5 level, and thereby the C5-6 and C6-7 levels.  However the C5-C7 levels were not seen on the x-ray, and therefore after the fusion construct was completed no x-ray was performed.  The annulus at each level was incised and the disc space entered.  Anterior osteophytic overgrowth was removed using the osteophyte removal tool and the high-speed drill.  Discectomy was performed with micro-curettes and pituitary  rongeurs. The operating microscope was draped and brought into the field provided additional magnification illumination and visualization. Discectomy was continued posteriorly thru the disc space and then the cartilaginous endplate was removed using micro-curettes along with the high-speed drill. Posterior osteophytic overgrowth was removed each level using the high-speed drill along with a 2 mm thin footplated Kerrison punch. Posterior longitudinal ligament along with disc herniation was carefully removed, decompressing the spinal canal and thecal sac. We then continued to remove osteophytic overgrowth and disc material decompressing the neural foramina and exiting nerve roots bilaterally. Once the decompression was completed hemostasis was established at each level with the use of Gelfoam with thrombin and bipolar cautery. The Gelfoam was removed, a thin layer of Surgifoam applied, the wound irrigated and hemostasis confirmed. We then measured the height of the intravertebral disc space level and selected a 6 millimeter in height structural allograft for the C5-6 level and a 6 millimeter in height structural allograft for the C6-7 level . Each was hydrated in saline solution and then gently positioned in the intravertebral disc space and countersunk. We then selected a 30 millimeter in height Aviator cervical plate. It was positioned over the fusion construct and secured to the vertebra with a pair of 4 x 12 mm self-tapping variable screws at each level. Each screw hole was started with the high-speed drill and then the screws placed, once all the screws were placed final tightening was performed, the locking system was secured. The wound was irrigated with bacitracin solution checked for hemostasis which was established and confirmed.  Under direct visualization, the fusion construct looked good.  We then proceeded with closure. The platysma was closed with interrupted inverted 2-0 undyed Vicryl suture, the  subcutaneous and subcuticular closed with interrupted inverted 3-0  undyed Vicryl suture. The skin edges were approximated with Dermabond. Following surgery the patient was taken out of cervical traction. To be reversed and the anesthetic and taken to the recovery room for further care.   PLAN OF CARE: Admit for overnight observation  PATIENT DISPOSITION:  PACU - hemodynamically stable.   Delay start of Pharmacological VTE agent (>24hrs) due to surgical blood loss or risk of bleeding:  yes

## 2018-11-01 DIAGNOSIS — M50122 Cervical disc disorder at C5-C6 level with radiculopathy: Secondary | ICD-10-CM | POA: Diagnosis not present

## 2018-11-01 LAB — GLUCOSE, CAPILLARY: Glucose-Capillary: 210 mg/dL — ABNORMAL HIGH (ref 70–99)

## 2018-11-01 MED ORDER — OXYCODONE HCL 5 MG PO TABS: 5.0000 mg | ORAL_TABLET | ORAL | 0 refills | Status: DC | PRN

## 2018-11-01 MED FILL — oxyCODONE HCL 5 MG TABS: 5 | 4 days supply | Qty: 50 | Fill #0

## 2018-11-01 NOTE — Discharge Summary (Signed)
Physician Discharge Summary  Patient ID: [name] MRN: [id number] DOB/AGE: May 22, 1960 58 y.o.  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  Postoperatively she has done well.  She is up and ambulating actively.  She is comfortable.  We are discharging her to home with instructions regarding wound care and activities.  She is scheduled for follow-up with me in the office with x-rays in 3 weeks.  A single prescription for pain medication (OxyIR 5 mg, 1 to 2 tablets every 4 hours as needed pain, #50) was sent in, subsequently her chronic pain management will resume with Dr. Adele Schilder at [facility].  Discharge Exam: Blood pressure 119/66, pulse 89, temperature 97.9 F (36.6 C), temperature source Oral, resp. rate 18, height 5' 4"  (1.626 m), weight 122.7 kg, last menstrual period 09/10/2017, SpO2 95 %.  Disposition: Discharge disposition: 01-Home or Self Care       Discharge Instructions    Discharge wound care:   Complete by: As directed    Leave the wound open to air. Shower daily with the wound uncovered. Water and soapy water should run over the incision area. Do not wash directly on the incision for 2 weeks. Remove the glue after 2 weeks.   Driving Restrictions   Complete by: As directed    No driving for 2 weeks. May ride in the car locally now. May begin to drive locally in 2 weeks.   Other Restrictions   Complete by: As directed    Walk gradually increasing distances out  in the fresh air at least twice a day. Walking additional 6 times inside the house, gradually increasing distances, daily. No bending, lifting, or twisting. Perform activities between shoulder and waist height (that is at counter height when standing or table height when sitting).     Allergies as of 11/01/2018      Reactions   Motrin [ibuprofen] Other (See Comments)   Avoid per MD   Tape Rash, Other (See Comments)   Skin Peels      Medication List    STOP taking these medications   diclofenac 75 MG EC tablet Commonly known as: VOLTAREN   hydrocortisone 25 MG suppository Commonly known as: ANUSOL-HC   lidocaine 5 % Commonly known as: Lidoderm   mometasone 50 MCG/ACT nasal spray Commonly known as: Nasonex     TAKE these medications   albuterol 108 (90 Base) MCG/ACT inhaler Commonly known as: VENTOLIN HFA Inhale 2 puffs into the lungs every 6 (six) hours as needed for wheezing or shortness of breath.   atorvastatin 40 MG tablet Commonly known as: LIPITOR Take 1 tablet (40 mg total) by mouth daily. What changed: when to take this   blood glucose meter kit and supplies Dispense based on patient and insurance preference. Use up to four times daily as directed. (FOR ICD-9 250.00, 250.01).   clindamycin 300 MG capsule Commonly known as: CLEOCIN Take 300 mg by mouth 3 (three) times daily. For cyst under breast   diclofenac sodium 1 % Gel Commonly known as: Voltaren Apply 2-4 grams topically to the right knee 3-4 times daily as  needed for pain. What changed:   how much to take  how to take this  when to take this  reasons to take this  additional instructions   FLUoxetine 20 MG capsule Commonly known as: PROZAC Take 1 capsule (20 mg total) by mouth daily.   fluticasone 50 MCG/ACT nasal spray Commonly known as: FLONASE Place 2 sprays into both nostrils daily.   gabapentin 300 MG capsule Commonly known as: NEURONTIN Take 300 mg by mouth at bedtime.    hydrochlorothiazide 25 MG tablet Commonly known as: HYDRODIURIL Take 1 tablet (25 mg total) by mouth daily. 1/2 tab by mouth daily What changed: additional instructions   insulin aspart 100 UNIT/ML injection Commonly known as: novoLOG Inject 5 Units into the skin 3 (three) times daily with meals.   insulin glargine 100 UNIT/ML injection Commonly known as: LANTUS Inject 0.36 mLs (36 Units total) into the skin daily. What changed: how much to take   ipratropium-albuterol 0.5-2.5 (3) MG/3ML Soln Commonly known as: DUONEB Take 3 mLs by nebulization every 6 (six) hours as needed (shortness of breath).   levothyroxine 125 MCG tablet Commonly known as: SYNTHROID Take 1 tablet (125 mcg total) by mouth daily before breakfast.   meloxicam 15 MG tablet Commonly known as: MOBIC Take one tab by mouth daily as needed for pain What changed:   how much to take  how to take this  when to take this  additional instructions   mometasone 220 MCG/INH inhaler Commonly known as: Asmanex (60 Metered Doses) Inhale 2 puffs into the lungs daily. What changed:   when to take this  reasons to take this   omeprazole 20 MG capsule Commonly known as: PRILOSEC Take 1 capsule (20 mg total) by mouth daily.   oxyCODONE 5 MG immediate release tablet Commonly known as: Oxy IR/ROXICODONE Take 1-2 tablets (5-10 mg total) by mouth every 4 (four) hours as needed (pain).   oxyCODONE-acetaminophen 10-325 MG tablet Commonly known as: PERCOCET Take 0.5 tablets by mouth 4 (four) times daily as needed for severe pain.   Ozempic (0.25 or 0.5 MG/DOSE) 2 MG/1.5ML Sopn Generic drug: Semaglutide(0.25 or 0.5MG/DOS) Inject 0.25-0.5 mg into the skin See admin instructions. Inject 0.42m under the skin every Monday for four weeks, then inject 0.551mweekly.   traZODone 50 MG tablet Commonly known as: DESYREL Take 100 mg by mouth at bedtime.   TRUEplus Lancets 28G Misc Use as directed             Discharge Care Instructions  (From admission, onward)         Start     Ordered   11/01/18 0000  Discharge wound care:    Comments: Leave the wound open to air. Shower daily with the wound uncovered. Water and soapy water should run over the incision area. Do not wash directly on the incision for 2 weeks. Remove the glue after 2 weeks.   11/01/18 0802           Signed: NUHosie Spangle/05/2018, 8:02 AM

## 2018-11-01 NOTE — Plan of Care (Signed)
Patient alert and oriented, mae's well, voiding adequate amount of urine, swallowing without difficulty, no c/o pain at time of discharge. Patient discharged home with family. Script and discharged instructions given to patient. Patient and family stated understanding of instructions given. Patient has an appointment with Dr. Nudelman 

## 2018-11-01 NOTE — Discharge Instructions (Signed)
  Call Your Doctor If Any of These Occur Redness, drainage, or swelling at the wound.  Temperature greater than 101 degrees. Severe pain not relieved by pain medication. Increased difficulty swallowing. Incision starts to come apart. Follow Up Appt Call today for appointment in 3 weeks [phone]) or for problems.  If you have any hardware placed in your spine, you will need an x-ray before your appointment.   

## 2018-11-05 ENCOUNTER — Encounter (HOSPITAL_COMMUNITY): Payer: Self-pay | Admitting: Neurosurgery

## 2018-11-06 ENCOUNTER — Encounter (HOSPITAL_COMMUNITY): Payer: Self-pay | Admitting: Neurosurgery

## 2018-12-03 ENCOUNTER — Other Ambulatory Visit: Payer: Self-pay | Admitting: Family Medicine

## 2018-12-03 DIAGNOSIS — Z1231 Encounter for screening mammogram for malignant neoplasm of breast: Secondary | ICD-10-CM

## 2019-01-16 ENCOUNTER — Ambulatory Visit
Admission: RE | Admit: 2019-01-16 | Discharge: 2019-01-16 | Disposition: A | Discharge status: Home / Self Care | Payer: Medicare Other | Source: Ambulatory Visit | Attending: Family Medicine | Admitting: Family Medicine

## 2019-01-16 ENCOUNTER — Other Ambulatory Visit: Payer: Self-pay

## 2019-01-16 DIAGNOSIS — Z1231 Encounter for screening mammogram for malignant neoplasm of breast: Secondary | ICD-10-CM

## 2019-07-14 ENCOUNTER — Ambulatory Visit: Payer: Medicare Other | Attending: Internal Medicine

## 2019-07-14 DIAGNOSIS — Z23 Encounter for immunization: Secondary | ICD-10-CM

## 2019-07-14 NOTE — Progress Notes (Signed)
   Covid-19 Vaccination Clinic  Name:  ITALIA WOLFERT    MRN: [id number] DOB: September 25, 1960  07/14/2019  Ms. Sawa was observed post Covid-19 immunization for 15 minutes without incident. She was provided with Vaccine Information Sheet and instruction to access the V-Safe system.   Ms. Lopresti was instructed to call 911 with any severe reactions post vaccine: Marland Kitchen Difficulty breathing  . Swelling of face and throat  . A fast heartbeat  . A bad rash all over body  . Dizziness and weakness   Immunizations Administered    Name Date Dose VIS Date Route   Pfizer COVID-19 Vaccine 07/14/2019 11:53 AM 0.3 mL 04/23/2018 Intramuscular   Manufacturer: [employer], [employer]   Lot: [id number]   NDC: [address]-4

## 2019-08-04 ENCOUNTER — Ambulatory Visit: Payer: Medicare Other | Attending: Internal Medicine

## 2019-08-04 DIAGNOSIS — Z23 Encounter for immunization: Secondary | ICD-10-CM

## 2019-08-04 NOTE — Progress Notes (Signed)
   Covid-19 Vaccination Clinic  Name:  [name]    MRN: [id number] DOB: 1961/01/08  08/04/2019  [name] was observed post Covid-19 immunization for 15 minutes without incident. She was provided with Vaccine Information Sheet and instruction to access the V-Safe system.   [name] was instructed to call 911 with any severe reactions post vaccine: Marland Kitchen Difficulty breathing  . Swelling of face and throat  . A fast heartbeat  . A bad rash all over body  . Dizziness and weakness   Immunizations Administered    Name Date Dose VIS Date Route   Pfizer COVID-19 Vaccine 08/04/2019 12:49 PM 0.3 mL 04/23/2018 Intramuscular   Manufacturer: [employer], [employer]   Lot: [id number]   NDC: [address]-6

## 2020-05-21 ENCOUNTER — Other Ambulatory Visit: Payer: Self-pay | Admitting: Family Medicine

## 2020-05-21 DIAGNOSIS — Z1231 Encounter for screening mammogram for malignant neoplasm of breast: Secondary | ICD-10-CM

## 2020-07-14 ENCOUNTER — Ambulatory Visit
Admission: RE | Admit: 2020-07-14 | Discharge: 2020-07-14 | Disposition: A | Discharge status: Home / Self Care | Payer: Medicare Other | Source: Ambulatory Visit | Attending: Family Medicine | Admitting: Family Medicine

## 2020-07-14 ENCOUNTER — Other Ambulatory Visit: Payer: Self-pay

## 2020-07-14 DIAGNOSIS — Z1231 Encounter for screening mammogram for malignant neoplasm of breast: Secondary | ICD-10-CM

## 2020-12-28 ENCOUNTER — Ambulatory Visit (INDEPENDENT_AMBULATORY_CARE_PROVIDER_SITE_OTHER): Payer: Medicare Other | Admitting: Podiatry

## 2020-12-28 ENCOUNTER — Other Ambulatory Visit: Payer: Self-pay

## 2020-12-28 ENCOUNTER — Encounter: Payer: Self-pay | Admitting: Podiatry

## 2020-12-28 DIAGNOSIS — M79675 Pain in left toe(s): Secondary | ICD-10-CM

## 2020-12-28 DIAGNOSIS — M79674 Pain in right toe(s): Secondary | ICD-10-CM

## 2020-12-28 DIAGNOSIS — E1165 Type 2 diabetes mellitus with hyperglycemia: Secondary | ICD-10-CM

## 2020-12-28 DIAGNOSIS — E119 Type 2 diabetes mellitus without complications: Secondary | ICD-10-CM

## 2020-12-28 DIAGNOSIS — B351 Tinea unguium: Secondary | ICD-10-CM | POA: Diagnosis not present

## 2020-12-28 NOTE — Progress Notes (Signed)
This patient returns to my office for at risk foot care.  This patient requires this care by a professional since this patient will be at risk due to having diabetes .  This patient is unable to cut nails herself since the patient cannot reach her nails.These nails are painful walking and wearing shoes.  This patient presents for at risk foot care today.  Patient has not been seen in over 2 years.  General Appearance  Alert, conversant and in no acute stress.  Vascular  Dorsalis pedis and posterior tibial  pulses are palpable  bilaterally.  Capillary return is within normal limits  bilaterally. Temperature is within normal limits  bilaterally.  Neurologic  Senn-Weinstein monofilament wire test within normal limits  bilaterally. Muscle power within normal limits bilaterally.  Nails Thick disfigured discolored nails with subungual debris  hallux  nails  bilaterally. No evidence of bacterial infection or drainage bilaterally.  Orthopedic  No limitations of motion  feet .  No crepitus or effusions noted.  No bony pathology or digital deformities noted.  Skin  normotropic skin with no porokeratosis noted bilaterally.  No signs of infections or ulcers noted.     Onychomycosis  Pain in right toes  Pain in left toes  Consent was obtained for treatment procedures.   Mechanical debridement of nails 1-5  bilaterally performed with a nail nipper.  Filed with dremel without incident.    Return office visit    3 months                  Told patient to return for periodic foot care and evaluation due to potential at risk complications.   Helane Gunther DPM

## 2021-03-30 ENCOUNTER — Ambulatory Visit: Payer: Medicare Other | Admitting: Podiatry

## 2021-04-01 ENCOUNTER — Encounter: Payer: Self-pay | Admitting: Podiatry

## 2021-04-01 ENCOUNTER — Other Ambulatory Visit: Payer: Self-pay

## 2021-04-01 ENCOUNTER — Ambulatory Visit (INDEPENDENT_AMBULATORY_CARE_PROVIDER_SITE_OTHER): Payer: Medicare Other | Admitting: Podiatry

## 2021-04-01 DIAGNOSIS — E1165 Type 2 diabetes mellitus with hyperglycemia: Secondary | ICD-10-CM | POA: Diagnosis not present

## 2021-04-01 DIAGNOSIS — E119 Type 2 diabetes mellitus without complications: Secondary | ICD-10-CM | POA: Diagnosis not present

## 2021-04-01 DIAGNOSIS — B351 Tinea unguium: Secondary | ICD-10-CM

## 2021-04-01 DIAGNOSIS — M79675 Pain in left toe(s): Secondary | ICD-10-CM

## 2021-04-01 DIAGNOSIS — M79674 Pain in right toe(s): Secondary | ICD-10-CM

## 2021-04-01 NOTE — Progress Notes (Signed)
This patient returns to my office for at risk foot care.  This patient requires this care by a professional since this patient will be at risk due to having diabetes .  This patient is unable to cut nails herself since the patient cannot reach her nails.These nails are painful walking and wearing shoes.  This patient presents for at risk foot care today.  Patient has not been seen in over 2 years.  General Appearance  Alert, conversant and in no acute stress.  Vascular  Dorsalis pedis and posterior tibial  pulses are palpable  bilaterally.  Capillary return is within normal limits  bilaterally. Temperature is within normal limits  bilaterally.  Neurologic  Senn-Weinstein monofilament wire test within normal limits  bilaterally. Muscle power within normal limits bilaterally.  Nails Thick disfigured discolored nails with subungual debris  hallux  nails  bilaterally. No evidence of bacterial infection or drainage bilaterally.  Orthopedic  No limitations of motion  feet .  No crepitus or effusions noted.  No bony pathology or digital deformities noted.  Skin  normotropic skin with no porokeratosis noted bilaterally.  No signs of infections or ulcers noted.     Onychomycosis  Pain in right toes  Pain in left toes  Consent was obtained for treatment procedures.   Mechanical debridement of nails 1-5  bilaterally performed with a nail nipper.  Filed with dremel without incident. After nail care patient requested an appointment for nail surgery left big toe to remove her ingrown nail.  Told her to make an appointment with Dr.  Allena Katz.   Return office visit    prn                 Told patient to return for periodic foot care and evaluation due to potential at risk complications.   Helane Gunther DPM

## 2021-04-09 ENCOUNTER — Ambulatory Visit (HOSPITAL_COMMUNITY)
Admission: EM | Admit: 2021-04-09 | Discharge: 2021-04-09 | Disposition: A | Discharge status: Home / Self Care | Payer: Medicare Other | Attending: Student | Admitting: Student

## 2021-04-09 ENCOUNTER — Other Ambulatory Visit: Payer: Self-pay

## 2021-04-09 ENCOUNTER — Encounter (HOSPITAL_COMMUNITY): Payer: Self-pay | Admitting: Emergency Medicine

## 2021-04-09 DIAGNOSIS — H66001 Acute suppurative otitis media without spontaneous rupture of ear drum, right ear: Secondary | ICD-10-CM

## 2021-04-09 DIAGNOSIS — H7091 Unspecified mastoiditis, right ear: Secondary | ICD-10-CM

## 2021-04-09 MED ORDER — CEFTRIAXONE SODIUM 500 MG IJ SOLR
500.0000 mg | Freq: Once | INTRAMUSCULAR | Status: AC
  Administered 2021-04-09: 500 mg via INTRAMUSCULAR

## 2021-04-09 MED ORDER — CEFTRIAXONE SODIUM 500 MG IJ SOLR
INTRAMUSCULAR | Status: AC
  Filled 2021-04-09: qty 500

## 2021-04-09 MED ORDER — AMOXICILLIN-POT CLAVULANATE 875-125 MG PO TABS
1.0000 | ORAL_TABLET | Freq: Two times a day (BID) | ORAL | 0 refills | Status: DC
Start: 2021-04-09 — End: 2022-06-20

## 2021-04-09 MED ORDER — LIDOCAINE HCL (PF) 1 % IJ SOLN
INTRAMUSCULAR | Status: AC
  Filled 2021-04-09: qty 2
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
## 2021-04-09 NOTE — Discharge Instructions (Addendum)
-  Start the antibiotic-Augmentin (amoxicillin-clavulanate), 1 pill every 12 hours for 7 days.  You can take this with food like with breakfast and dinner. -Albuterol inhaler as needed for cough, wheezing, shortness of breath, 1 to 2 puffs every 6 hours as needed. -Continue DuoNeb nebulizer -Follow-up if symptoms worsen - hearing changes, new fevers, new dizziness, etc

## 2021-04-09 NOTE — ED Triage Notes (Signed)
Patient reports sore throat and right ear pain for 3 weeks.  Patient reports when coughing, particularly at night, she feels pain in right neck

## 2021-04-22 ENCOUNTER — Ambulatory Visit: Payer: Medicare Other | Admitting: Podiatry

## 2021-05-06 ENCOUNTER — Ambulatory Visit: Payer: Medicare Other | Admitting: Podiatry

## 2021-05-13 ENCOUNTER — Ambulatory Visit (INDEPENDENT_AMBULATORY_CARE_PROVIDER_SITE_OTHER): Payer: Medicare Other | Admitting: Podiatry

## 2021-05-13 ENCOUNTER — Other Ambulatory Visit: Payer: Self-pay

## 2021-05-13 DIAGNOSIS — L600 Ingrowing nail: Secondary | ICD-10-CM

## 2021-05-18 ENCOUNTER — Encounter: Payer: Self-pay | Admitting: Podiatry
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
## 2021-12-15 ENCOUNTER — Other Ambulatory Visit (HOSPITAL_COMMUNITY): Payer: Self-pay | Admitting: Family Medicine

## 2021-12-15 DIAGNOSIS — R42 Dizziness and giddiness: Secondary | ICD-10-CM

## 2021-12-29 ENCOUNTER — Ambulatory Visit (HOSPITAL_COMMUNITY): Admission: RE | Admit: 2021-12-29 | Payer: Medicare Other | Source: Ambulatory Visit

## 2021-12-29 ENCOUNTER — Encounter (HOSPITAL_COMMUNITY): Payer: Self-pay

## 2022-01-06 ENCOUNTER — Ambulatory Visit (HOSPITAL_COMMUNITY)
Admission: RE | Admit: 2022-01-06 | Discharge: 2022-01-06 | Disposition: A | Discharge status: Home / Self Care | Payer: Medicare Other | Source: Ambulatory Visit | Attending: Family Medicine | Admitting: Family Medicine

## 2022-01-06 DIAGNOSIS — R42 Dizziness and giddiness: Secondary | ICD-10-CM | POA: Insufficient documentation

## 2022-01-30 ENCOUNTER — Emergency Department (HOSPITAL_COMMUNITY)
Admission: EM | Admit: 2022-01-30 | Discharge: 2022-01-31 | Disposition: A | Discharge status: Home / Self Care | Payer: Medicare Other | Attending: Emergency Medicine | Admitting: Emergency Medicine

## 2022-01-30 ENCOUNTER — Encounter (HOSPITAL_COMMUNITY): Payer: Self-pay

## 2022-01-30 ENCOUNTER — Emergency Department (HOSPITAL_COMMUNITY): Payer: Medicare Other

## 2022-01-30 DIAGNOSIS — R0789 Other chest pain: Secondary | ICD-10-CM | POA: Diagnosis present

## 2022-01-30 DIAGNOSIS — Y92410 Unspecified street and highway as the place of occurrence of the external cause: Secondary | ICD-10-CM | POA: Diagnosis not present
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
## 2022-01-31 ENCOUNTER — Other Ambulatory Visit (HOSPITAL_COMMUNITY): Payer: Self-pay

## 2022-01-31 MED ORDER — METHOCARBAMOL 500 MG PO TABS
500.0000 mg | ORAL_TABLET | Freq: Two times a day (BID) | ORAL | 0 refills | Status: DC
  Filled 2022-01-31: qty 20, 10d supply, fill #0
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
## 2022-01-31 NOTE — Discharge Instructions (Addendum)
Your x-ray did not show any fractures.  Overall concerning findings.  Exam was overall reassuring. I sent Robaxin to the pharmacy for you to use over the next few days for your muscle aches and pains.  You may notice that you have areas of pain at the site the next couple days.  You can take Tylenol for pain, as you mentioned you cannot take ibuprofen or other NSAIDs. Robaxin is a muscle relaxer and will be drowsy.  Do not drive or do anything else dangerous after taking this.  For any concerning symptoms return to the emergency room otherwise follow-up with your primary care provider.

## 2022-02-08 ENCOUNTER — Other Ambulatory Visit (HOSPITAL_COMMUNITY): Payer: Self-pay

## 2022-04-27 ENCOUNTER — Ambulatory Visit: Payer: Medicare HMO | Admitting: Allergy & Immunology

## 2022-05-29 ENCOUNTER — Other Ambulatory Visit: Payer: Self-pay

## 2022-05-29 ENCOUNTER — Encounter: Payer: Self-pay | Admitting: Internal Medicine

## 2022-05-29 ENCOUNTER — Ambulatory Visit (INDEPENDENT_AMBULATORY_CARE_PROVIDER_SITE_OTHER): Payer: Medicare HMO | Admitting: Internal Medicine

## 2022-05-29 VITALS — BP 126/82 | HR 82 | Temp 98.1°F | Resp 20 | Ht 64.0 in | Wt 221.9 lb

## 2022-05-29 DIAGNOSIS — J4489 Other specified chronic obstructive pulmonary disease: Secondary | ICD-10-CM | POA: Diagnosis not present

## 2022-05-29 DIAGNOSIS — G4733 Obstructive sleep apnea (adult) (pediatric): Secondary | ICD-10-CM

## 2022-05-29 DIAGNOSIS — J3089 Other allergic rhinitis: Secondary | ICD-10-CM | POA: Diagnosis not present

## 2022-05-29 DIAGNOSIS — J343 Hypertrophy of nasal turbinates: Secondary | ICD-10-CM | POA: Diagnosis not present

## 2022-05-29 DIAGNOSIS — F17218 Nicotine dependence, cigarettes, with other nicotine-induced disorders: Secondary | ICD-10-CM

## 2022-05-29 MED ORDER — CETIRIZINE HCL 10 MG PO TABS: 10.0000 mg | ORAL_TABLET | Freq: Every day | ORAL | 5 refills | Status: DC | PRN

## 2022-05-29 MED ORDER — ALBUTEROL SULFATE HFA 108 (90 BASE) MCG/ACT IN AERS: 2.0000 | INHALATION_SPRAY | Freq: Four times a day (QID) | RESPIRATORY_TRACT | 1 refills | Status: DC | PRN

## 2022-05-29 MED ORDER — AZELASTINE HCL 0.1 % NA SOLN: 1.0000 | Freq: Two times a day (BID) | NASAL | 5 refills | Status: DC | PRN

## 2022-05-29 MED ORDER — BUDESONIDE-FORMOTEROL FUMARATE 160-4.5 MCG/ACT IN AERO: 2.0000 | INHALATION_SPRAY | Freq: Two times a day (BID) | RESPIRATORY_TRACT | 5 refills | Status: DC

## 2022-05-29 MED ORDER — FLUTICASONE PROPIONATE 50 MCG/ACT NA SUSP: 2.0000 | Freq: Every day | NASAL | 5 refills | Status: DC
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
## 2022-05-29 NOTE — Patient Instructions (Addendum)
Asthma/COPD:  - Work on stopping smoking as we discussed in clinic.  We will also refer you to [facility] for COPD evaluation.   - Maintenance inhaler: start Symbicort 160-4.70mcg 2 puffs twice daily with spacer.   - Rescue inhaler: Albuterol 2 puffs via spacer or 1 vial via nebulizer every 4-6 hours as needed for respiratory symptoms of cough, shortness of breath, or wheezing Control goals:  Full participation in all desired activities (may need albuterol before activity) Albuterol use two times or less a week on average (not counting use with activity) Cough interfering with sleep two times or less a month Oral steroids no more than once a year No hospitalizations  Obstructive Sleep Apnea - Wear your CPAP whenever you go to sleep.  This does not help with your cough but it does help make sure you sleep better and avoid development of other chronic medical problems.   Chronic Rhinitis: - Positive skin test 05/2022: none - Use nasal saline rinses before nose sprays such as with Neilmed Sinus Rinse.  Use distilled water.   - Use Flonase 2 sprays each nostril daily. Aim upward and outward. - Use Azelastine 1-2 sprays each nostril twice daily as needed. Aim upward and outward. - Use Zyrtec 10 mg daily as needed for runny nose, sneezing, itchy watery eyes.

## 2022-05-30 ENCOUNTER — Telehealth: Payer: Self-pay | Admitting: Internal Medicine

## 2022-05-30 NOTE — Telephone Encounter (Signed)
Called patient - DPR verified - LMOVM advising patient to contact pharmacy regarding her co-pay then her insurance company due to our office having no control over the copay charge.  If patient calls back, please advise her of the above. Thank you.

## 2022-05-30 NOTE — Telephone Encounter (Signed)
Patient came in yesterday and seen Dr. Posey Pronto and had ventolin inhaler and symbicort inhale called into [facility] on [address].she said they are charging her $35.00 and she said that she doesn't pay anything.  [phone].

## 2022-06-02 ENCOUNTER — Telehealth: Payer: Self-pay

## 2022-06-02 ENCOUNTER — Other Ambulatory Visit (HOSPITAL_COMMUNITY): Payer: Self-pay

## 2022-06-02 NOTE — Telephone Encounter (Signed)
-----   Message from Birder Robson, MD sent at 05/29/2022  4:29 PM EDT ----- Hello I would like for her to see Pulm for COPD.  Thanks

## 2022-06-02 NOTE — Telephone Encounter (Signed)
[name] is scheduled to be seen on 06/20/2022 @ 3:00 PM  [name]. Karen Lance, MD [facility] at [facility] [address] [place],  [place]  [address] Main: [phone]  Spoke with the patient and she was unable to write down the information. I called back and left a detailed voicemail for the patient.
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
## 2022-06-20 ENCOUNTER — Ambulatory Visit (INDEPENDENT_AMBULATORY_CARE_PROVIDER_SITE_OTHER): Payer: Medicare HMO | Admitting: Student

## 2022-06-20 ENCOUNTER — Encounter: Payer: Self-pay | Admitting: Student

## 2022-06-20 VITALS — BP 122/76 | HR 78 | Temp 98.2°F | Ht 64.0 in | Wt 229.4 lb

## 2022-06-20 DIAGNOSIS — R490 Dysphonia: Secondary | ICD-10-CM | POA: Diagnosis not present

## 2022-06-20 DIAGNOSIS — J4489 Other specified chronic obstructive pulmonary disease: Secondary | ICD-10-CM | POA: Diagnosis not present

## 2022-06-20 DIAGNOSIS — G4733 Obstructive sleep apnea (adult) (pediatric): Secondary | ICD-10-CM | POA: Diagnosis not present

## 2022-06-20 DIAGNOSIS — F17200 Nicotine dependence, unspecified, uncomplicated: Secondary | ICD-10-CM

## 2022-06-20 NOTE — Patient Instructions (Addendum)
-   symbicort 2 puffs twice daily rinse mouth and brush teeth/tongue  after each use - albuterol as needed - this is your rescue inhaler - stop trelegy - take shower, clear nose of crusting then flonase 1 spray each nostril daily  - continue zyrtec 10 mg tablet daily - order for home sleep study placed today, lung cancer screening CT Chest you'll be called to schedule those

## 2022-07-03 ENCOUNTER — Other Ambulatory Visit: Payer: Self-pay | Admitting: Family Medicine

## 2022-07-03 ENCOUNTER — Ambulatory Visit
Admission: RE | Admit: 2022-07-03 | Discharge: 2022-07-03 | Disposition: A | Discharge status: Home / Self Care | Payer: Medicare HMO | Source: Ambulatory Visit | Attending: Family Medicine | Admitting: Family Medicine

## 2022-07-03 DIAGNOSIS — R0602 Shortness of breath: Secondary | ICD-10-CM

## 2022-07-11 ENCOUNTER — Ambulatory Visit: Payer: Medicare HMO | Admitting: Internal Medicine

## 2022-07-18 ENCOUNTER — Ambulatory Visit (INDEPENDENT_AMBULATORY_CARE_PROVIDER_SITE_OTHER): Payer: Medicare HMO | Admitting: Internal Medicine

## 2022-07-18 ENCOUNTER — Encounter: Payer: Self-pay | Admitting: Internal Medicine

## 2022-07-18 ENCOUNTER — Other Ambulatory Visit: Payer: Self-pay

## 2022-07-18 VITALS — BP 148/72 | HR 77 | Temp 98.0°F

## 2022-07-18 DIAGNOSIS — J4489 Other specified chronic obstructive pulmonary disease: Secondary | ICD-10-CM

## 2022-07-18 DIAGNOSIS — J310 Chronic rhinitis: Secondary | ICD-10-CM | POA: Diagnosis not present

## 2022-07-18 DIAGNOSIS — F17218 Nicotine dependence, cigarettes, with other nicotine-induced disorders: Secondary | ICD-10-CM | POA: Diagnosis not present

## 2022-07-18 MED ORDER — ALBUTEROL SULFATE HFA 108 (90 BASE) MCG/ACT IN AERS: 2.0000 | INHALATION_SPRAY | Freq: Four times a day (QID) | RESPIRATORY_TRACT | 1 refills | Status: DC | PRN

## 2022-07-18 MED ORDER — FLUTICASONE PROPIONATE 50 MCG/ACT NA SUSP: 2.0000 | Freq: Every day | NASAL | 5 refills | Status: DC

## 2022-07-18 MED ORDER — CETIRIZINE HCL 10 MG PO TABS: 10.0000 mg | ORAL_TABLET | Freq: Every day | ORAL | 5 refills | Status: DC | PRN

## 2022-07-18 MED ORDER — BREZTRI AEROSPHERE 160-9-4.8 MCG/ACT IN AERO: 2.0000 | INHALATION_SPRAY | Freq: Two times a day (BID) | RESPIRATORY_TRACT | 5 refills | Status: DC

## 2022-07-18 MED ORDER — AZELASTINE HCL 0.1 % NA SOLN: 1.0000 | Freq: Two times a day (BID) | NASAL | 5 refills | Status: AC | PRN

## 2022-07-18 NOTE — Patient Instructions (Addendum)
Asthma/COPD:  - Maintenance inhaler: stop Symbicort. Start Breztri 160-9-4.15mcg 2 puffs twice daily with spacer. Brush and gargle after use.   - Rescue inhaler: Albuterol 2 puffs via spacer or 1 vial via nebulizer every 4-6 hours as needed for respiratory symptoms of cough, shortness of breath, or wheezing Control goals:  Full participation in all desired activities (may need albuterol before activity) Albuterol use two times or less a week on average (not counting use with activity) Cough interfering with sleep two times or less a month Oral steroids no more than once a year No hospitalizations  Obstructive Sleep Apnea - Please call the Pulm office to figure out the home sleep study.   - Wear your CPAP whenever you go to sleep.    Chronic Rhinitis: - Positive skin test 05/2022: none - Use nasal saline rinses before nose sprays such as with Neilmed Sinus Rinse.  Use distilled water.   - Use Flonase 2 sprays each nostril daily. Aim upward and outward. - Use Azelastine 1-2 sprays each nostril twice daily as needed. Aim upward and outward. - Use Zyrtec 10 mg daily as needed for runny nose, sneezing, itchy watery eyes.   Tobacco Use - The best thing you can do for your health is to stop smoking.
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
## 2022-07-23 IMAGING — MG MM DIGITAL SCREENING BILAT W/ TOMO AND CAD
8 series · 8 of 24 positions shown · non-contrast
Comparison: Previous exam(s).

CLINICAL DATA: Screening.

EXAM:
DIGITAL SCREENING BILATERAL MAMMOGRAM WITH TOMOSYNTHESIS AND CAD
TECHNIQUE: Bilateral screening digital craniocaudal and mediolateral oblique
mammograms were obtained. Bilateral screening digital breast
tomosynthesis was performed. The images were evaluated with
computer-aided detection.

[L MLO synth-2D]
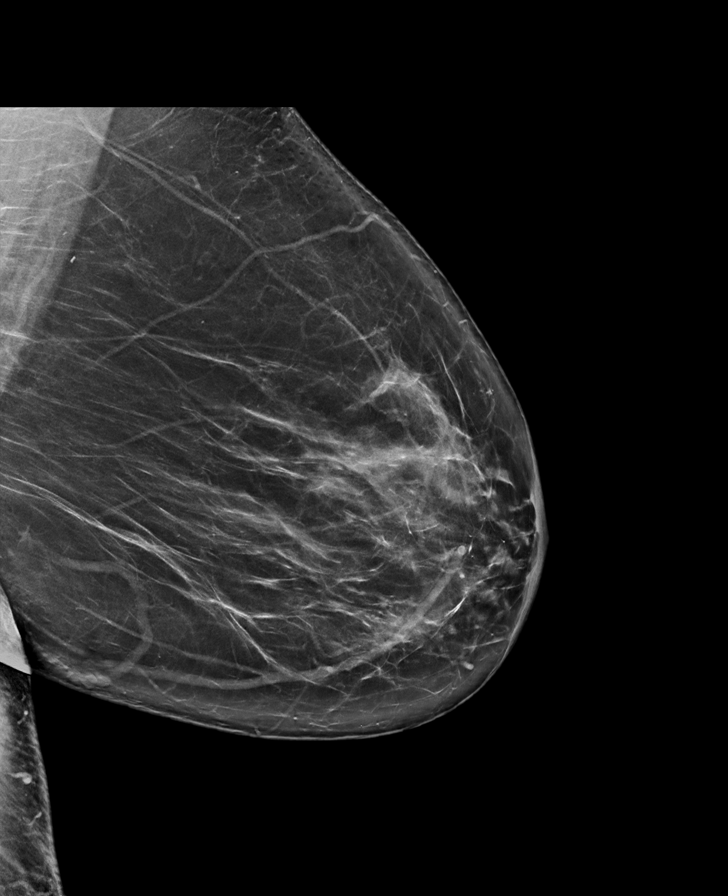

[R MLO synth-2D]
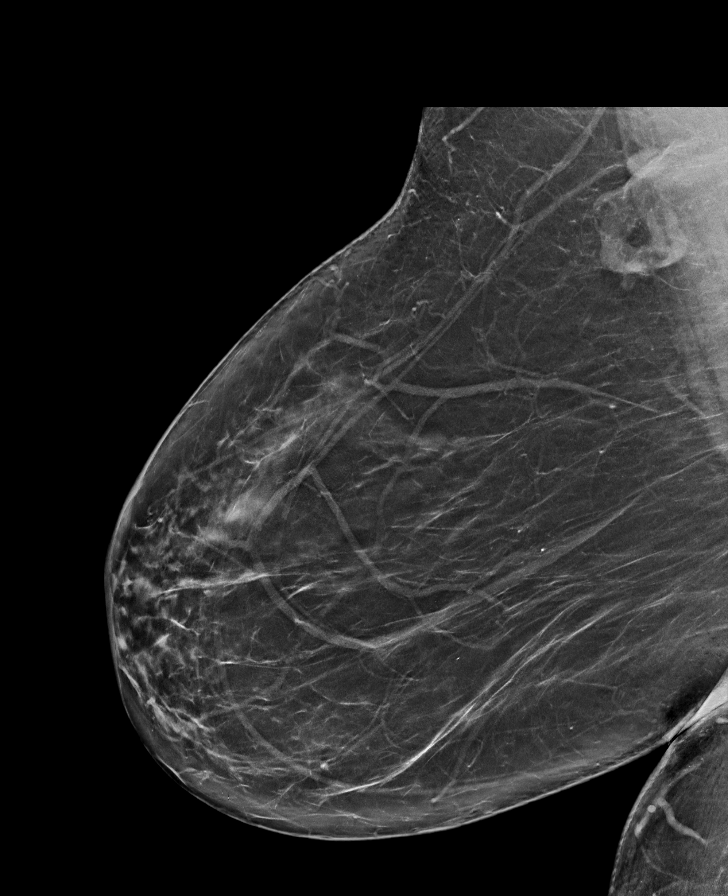

[L CC synth-2D]
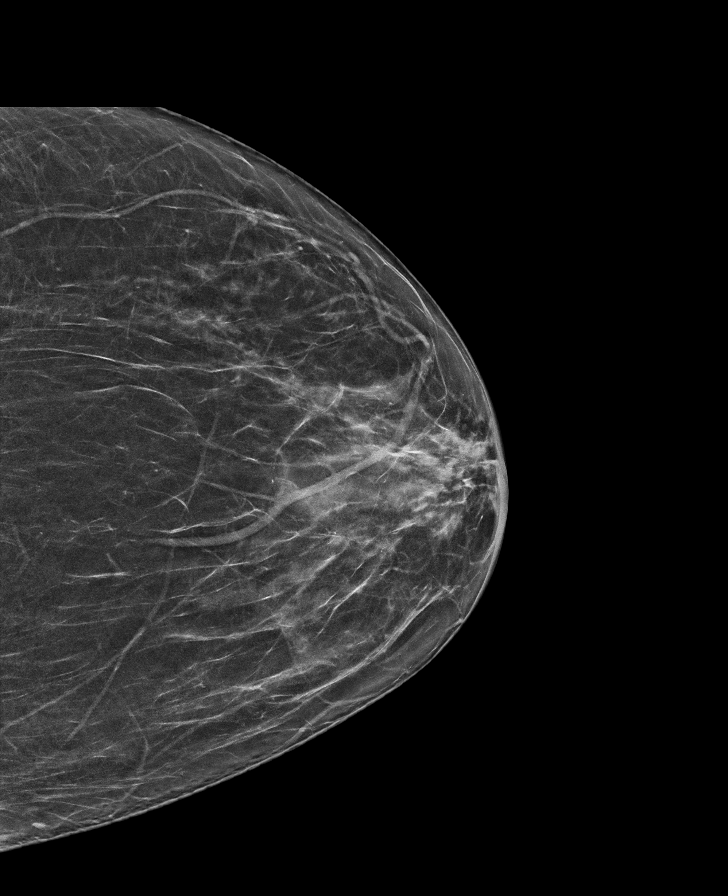

[R CC synth-2D]
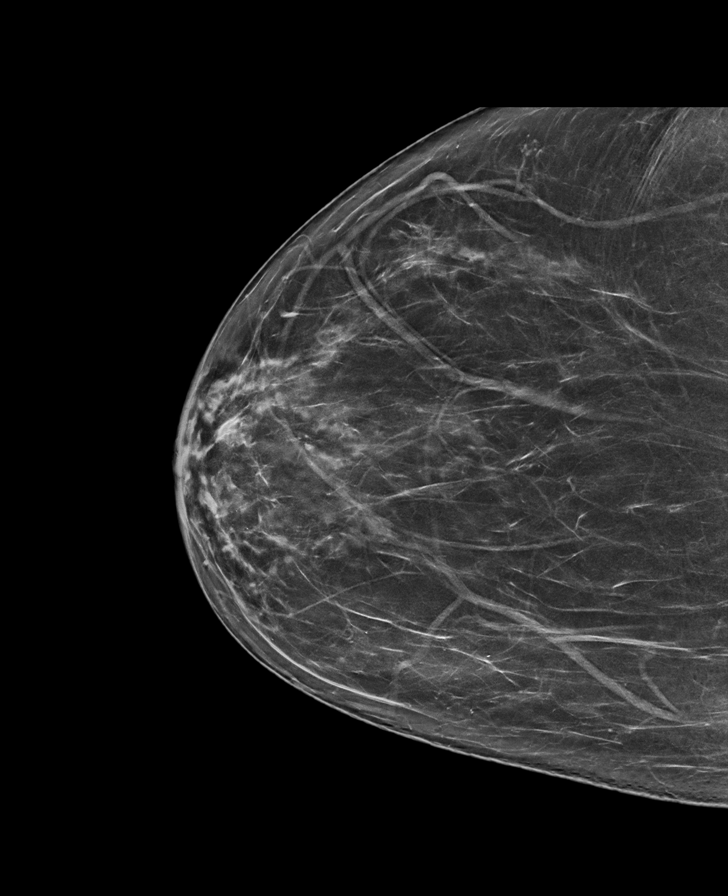

[L CC tomo · tomo slice 35/69.0]
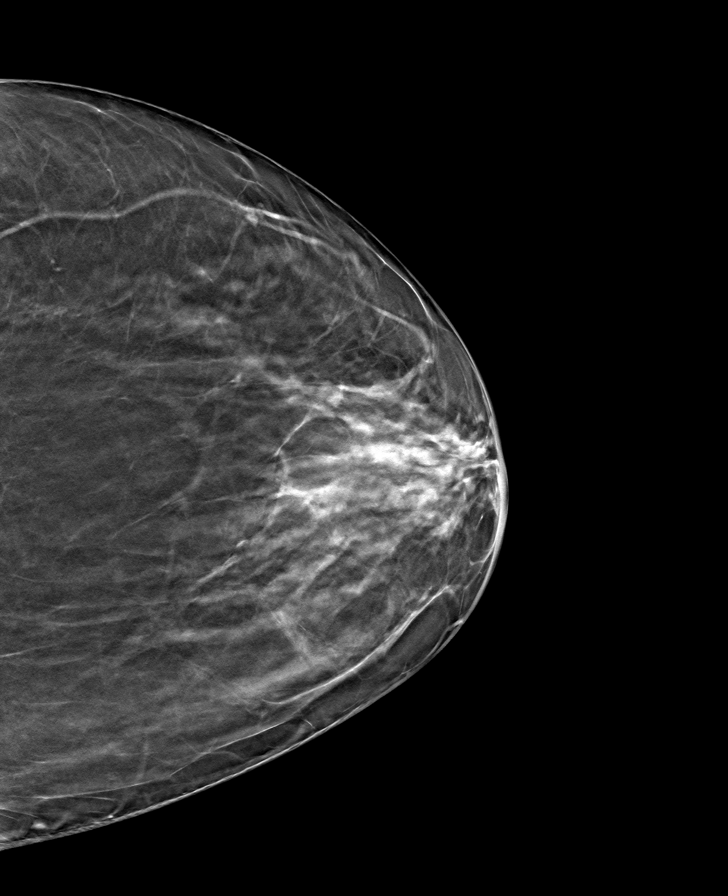

[R MLO tomo · tomo slice 43/86.0]
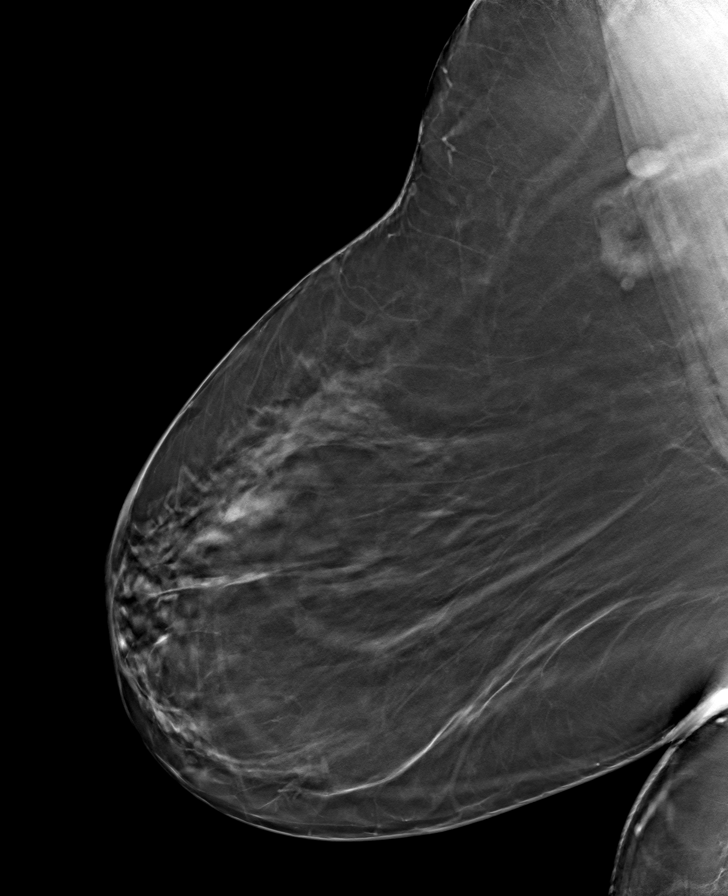

[L MLO tomo · tomo slice 43/86.0]
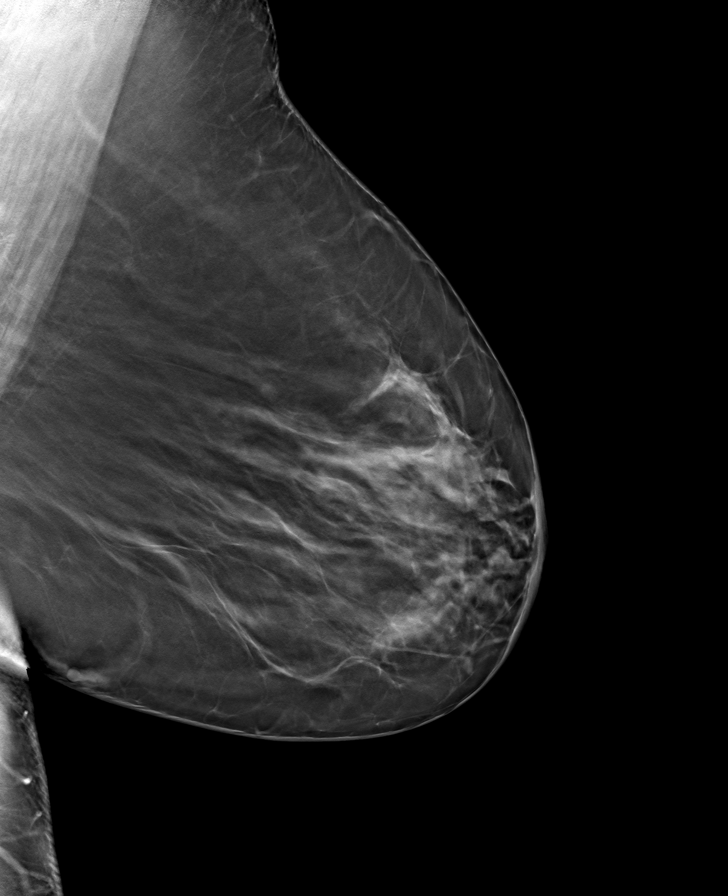

[R CC tomo · tomo slice 39/77.0]
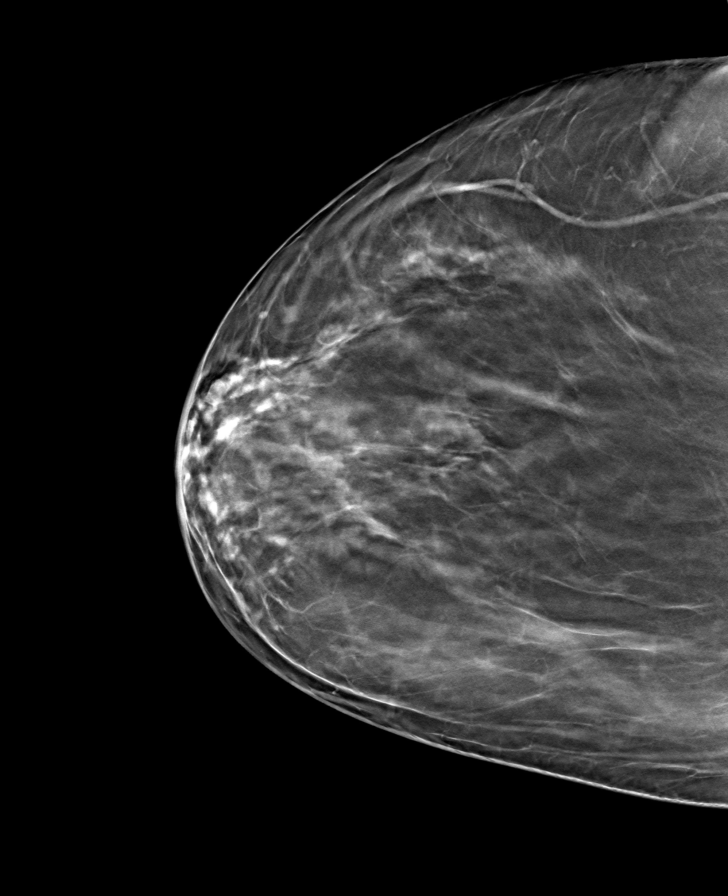

[8 of 24 positions shown; findings below may reference images not displayed]

ACR Breast Density Category b: There are scattered areas of
fibroglandular density.
FINDINGS: There are no findings suspicious for malignancy. The images were
evaluated with computer-aided detection.
IMPRESSION: No mammographic evidence of malignancy. A result letter of this
screening mammogram will be mailed directly to the patient.

RECOMMENDATION:
Screening mammogram in one year. (Code:WJ-[id number])

BI-RADS CATEGORY  1: Negative.

## 2022-08-10 ENCOUNTER — Other Ambulatory Visit: Payer: Self-pay | Admitting: Internal Medicine

## 2022-08-10 ENCOUNTER — Inpatient Hospital Stay (HOSPITAL_COMMUNITY)
Admission: EM | Admit: 2022-08-10 | Discharge: 2022-08-16 | DRG: 030 | Disposition: A | Discharge status: Home Health | Payer: Medicare HMO | Attending: Neurosurgery | Admitting: Neurosurgery

## 2022-08-10 ENCOUNTER — Other Ambulatory Visit: Payer: Self-pay

## 2022-08-10 ENCOUNTER — Encounter (HOSPITAL_COMMUNITY): Payer: Self-pay

## 2022-08-10 DIAGNOSIS — Z79890 Hormone replacement therapy (postmenopausal): Secondary | ICD-10-CM

## 2022-08-10 DIAGNOSIS — Z91048 Other nonmedicinal substance allergy status: Secondary | ICD-10-CM

## 2022-08-10 DIAGNOSIS — J4489 Other specified chronic obstructive pulmonary disease: Secondary | ICD-10-CM | POA: Diagnosis present

## 2022-08-10 DIAGNOSIS — Z8261 Family history of arthritis: Secondary | ICD-10-CM

## 2022-08-10 DIAGNOSIS — Z79899 Other long term (current) drug therapy: Secondary | ICD-10-CM

## 2022-08-10 DIAGNOSIS — M159 Polyosteoarthritis, unspecified: Secondary | ICD-10-CM | POA: Diagnosis present

## 2022-08-10 DIAGNOSIS — Z825 Family history of asthma and other chronic lower respiratory diseases: Secondary | ICD-10-CM

## 2022-08-10 DIAGNOSIS — I110 Hypertensive heart disease with heart failure: Secondary | ICD-10-CM | POA: Diagnosis present

## 2022-08-10 DIAGNOSIS — Z6839 Body mass index (BMI) 39.0-39.9, adult: Secondary | ICD-10-CM

## 2022-08-10 DIAGNOSIS — K219 Gastro-esophageal reflux disease without esophagitis: Secondary | ICD-10-CM | POA: Diagnosis present

## 2022-08-10 DIAGNOSIS — Z981 Arthrodesis status: Secondary | ICD-10-CM

## 2022-08-10 DIAGNOSIS — M4714 Other spondylosis with myelopathy, thoracic region: Secondary | ICD-10-CM

## 2022-08-10 DIAGNOSIS — M50223 Other cervical disc displacement at C6-C7 level: Secondary | ICD-10-CM | POA: Diagnosis present

## 2022-08-10 DIAGNOSIS — G950 Syringomyelia and syringobulbia: Secondary | ICD-10-CM | POA: Diagnosis present

## 2022-08-10 DIAGNOSIS — Q068 Other specified congenital malformations of spinal cord: Secondary | ICD-10-CM

## 2022-08-10 DIAGNOSIS — E785 Hyperlipidemia, unspecified: Secondary | ICD-10-CM | POA: Diagnosis present

## 2022-08-10 DIAGNOSIS — G4730 Sleep apnea, unspecified: Secondary | ICD-10-CM | POA: Diagnosis present

## 2022-08-10 DIAGNOSIS — E039 Hypothyroidism, unspecified: Secondary | ICD-10-CM | POA: Diagnosis present

## 2022-08-10 DIAGNOSIS — F17210 Nicotine dependence, cigarettes, uncomplicated: Secondary | ICD-10-CM | POA: Diagnosis present

## 2022-08-10 DIAGNOSIS — R208 Other disturbances of skin sensation: Secondary | ICD-10-CM | POA: Diagnosis present

## 2022-08-10 DIAGNOSIS — Z7984 Long term (current) use of oral hypoglycemic drugs: Secondary | ICD-10-CM

## 2022-08-10 DIAGNOSIS — Z886 Allergy status to analgesic agent status: Secondary | ICD-10-CM

## 2022-08-10 DIAGNOSIS — F32A Depression, unspecified: Secondary | ICD-10-CM | POA: Diagnosis present

## 2022-08-10 DIAGNOSIS — G2581 Restless legs syndrome: Secondary | ICD-10-CM | POA: Diagnosis present

## 2022-08-10 DIAGNOSIS — Z833 Family history of diabetes mellitus: Secondary | ICD-10-CM

## 2022-08-10 DIAGNOSIS — G9612 Meningeal adhesions (cerebral) (spinal): Principal | ICD-10-CM | POA: Diagnosis present

## 2022-08-10 DIAGNOSIS — I509 Heart failure, unspecified: Secondary | ICD-10-CM | POA: Diagnosis present

## 2022-08-10 DIAGNOSIS — E119 Type 2 diabetes mellitus without complications: Secondary | ICD-10-CM | POA: Diagnosis present

## 2022-08-10 DIAGNOSIS — M5416 Radiculopathy, lumbar region: Secondary | ICD-10-CM | POA: Diagnosis present

## 2022-08-10 LAB — CBC WITH DIFFERENTIAL/PLATELET
Abs Immature Granulocytes: 0.04 10*3/uL (ref 0.00–0.07)
Basophils Absolute: 0.1 10*3/uL (ref 0.0–0.1)
Basophils Relative: 1 %
Eosinophils Absolute: 0.2 10*3/uL (ref 0.0–0.5)
Eosinophils Relative: 2 %
HCT: 45.9 % (ref 36.0–46.0)
Hemoglobin: 15.1 g/dL — ABNORMAL HIGH (ref 12.0–15.0)
Immature Granulocytes: 1 %
Lymphocytes Relative: 29 %
Lymphs Abs: 2.2 10*3/uL (ref 0.7–4.0)
MCH: 31.5 pg (ref 26.0–34.0)
MCHC: 32.9 g/dL (ref 30.0–36.0)
MCV: 95.6 fL (ref 80.0–100.0)
Monocytes Absolute: 0.5 10*3/uL (ref 0.1–1.0)
Monocytes Relative: 7 %
Neutro Abs: 4.4 10*3/uL (ref 1.7–7.7)
Neutrophils Relative %: 60 %
Platelets: 309 10*3/uL (ref 150–400)
RBC: 4.8 MIL/uL (ref 3.87–5.11)
RDW: 15 % (ref 11.5–15.5)
WBC: 7.4 10*3/uL (ref 4.0–10.5)
nRBC: 0 % (ref 0.0–0.2)

## 2022-08-10 LAB — BASIC METABOLIC PANEL
Anion gap: 10 (ref 5–15)
BUN: 16 mg/dL (ref 6–20)
CO2: 27 mmol/L (ref 22–32)
Calcium: 8.7 mg/dL — ABNORMAL LOW (ref 8.9–10.3)
Chloride: 101 mmol/L (ref 98–111)
Creatinine, Ser: 0.97 mg/dL (ref 0.44–1.00)
GFR, Estimated: >60 mL/min (ref >60)
Glucose, Bld: 124 mg/dL — ABNORMAL HIGH (ref 70–99)
Potassium: 4.1 mmol/L (ref 3.5–5.1)
Sodium: 138 mmol/L (ref 135–145)

## 2022-08-10 NOTE — ED Provider Notes (Signed)
[place] EMERGENCY DEPARTMENT AT [facility] Provider Note   CSN: [id number] Arrival date & time: 08/10/22  1724     History  Chief Complaint  Patient presents with   burning sensation on body    [name] is a 62 y.o. female.  With PMH of DM, HTN, HLD, CHF, COPD, s/p two-level C5-6 and C6-7 ACDF  in 2020 who presents with burning sensation of right side of body and face ongoing for the past month but worsening over the past few days.  Patient says she has burning sensation in the right side of her face and right arm ongoing for the past month but worsening over the past few days.  She takes gabapentin 300 mg 3 times daily.  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  She has had no recent falls or injuries.  No fevers, no chills, no neck pain, no vomiting.  Denies any weakness or numbness in the legs.  Has restless leg at baseline.  Denies any history of stroke in the past.  Denies chest pain or shortness of breath  HPI     Home Medications Prior to Admission medications   Medication Sig Start Date End Date Taking? Authorizing Provider  JARDIANCE 25 MG TABS tablet Take 25 mg by mouth daily. 11/11/21  Yes [provider]  ACCU-CHEK GUIDE test strip 1 each by Other route. 11/04/20   [provider]  albuterol (VENTOLIN HFA) 108 (90 Base) MCG/ACT inhaler INHALE 2 PUFFS INTO THE LUNGS EVERY 6 (SIX) HOURS AS NEEDED FOR WHEEZING OR SHORTNESS OF BREATH. 08/10/22   Birder Robson, MD  atorvastatin (LIPITOR) 20 MG tablet Take 20 mg by mouth daily. 04/27/21   [provider]  azelastine (ASTELIN) 0.1 % nasal spray Place 1 spray into both nostrils 2 (two) times daily as needed for rhinitis. Use in each nostril as directed 07/18/22   Birder Robson, MD  blood glucose meter kit and supplies Dispense based on patient and insurance  preference. Use up to four times daily as directed. (FOR ICD-9 250.00, 250.01). 11/29/16   Zannie Cove, MD  Budeson-Glycopyrrol-Formoterol (BREZTRI AEROSPHERE) 160-9-4.8 MCG/ACT AERO Inhale 2 puffs into the lungs in the morning and at bedtime. 07/18/22   Birder Robson, MD  calcium carbonate (SUPER CALCIUM) 1500 (600 Ca) MG TABS tablet Take 600 mg of elemental calcium by mouth daily with breakfast. 01/28/20   [provider]  cetirizine (ZYRTEC ALLERGY) 10 MG tablet Take 1 tablet (10 mg total) by mouth daily as needed for rhinitis. 07/18/22   Birder Robson, MD  FLUoxetine (PROZAC) 20 MG capsule Take 1 capsule (20 mg total) by mouth daily. 12/20/16   Vivianne Master, PA-C  fluticasone (FLONASE) 50 MCG/ACT nasal spray Place 2 sprays into both nostrils daily. 07/18/22   Birder Robson, MD  gabapentin (NEURONTIN) 300 MG capsule Take 300 mg by mouth at bedtime. 09/06/18   [provider]  hydrochlorothiazide (HYDRODIURIL) 25 MG tablet Take 25 mg by mouth daily. 09/14/21   [provider]  hydrOXYzine (ATARAX) 50 MG tablet Take 50 mg by mouth at bedtime. 01/24/22   [provider]  ipratropium-albuterol (DUONEB) 0.5-2.5 (3) MG/3ML SOLN Take 3 mLs by nebulization every 6 (six) hours as needed (shortness of breath).  08/26/18   [provider]  levothyroxine (SYNTHROID,  LEVOTHROID) 125 MCG tablet Take 1 tablet (125 mcg total) by mouth daily before breakfast. 12/20/16   Vivianne Master, PA-C  Magnesium Oxide -Mg Supplement 500 MG CAPS Take 1 capsule by mouth daily. 03/18/21   [provider]  metFORMIN (GLUCOPHAGE) 1000 MG tablet Take 1,000 mg by mouth 2 (two) times daily with a meal. Patient not taking: Reported on 07/18/2022 11/11/21   [provider]  methocarbamol (ROBAXIN) 500 MG tablet Take 1 tablet (500 mg total) by mouth 2 (two) times daily. 01/31/22   Marita Kansas, PA-C  omeprazole (PRILOSEC) 20 MG capsule Take 1 capsule (20 mg total) by mouth  daily. 12/20/16   Vivianne Master, PA-C  oxyCODONE (OXY IR/ROXICODONE) 5 MG immediate release tablet Take 1-2 tablets (5-10 mg total) by mouth every 4 (four) hours as needed (pain). Patient not taking: Reported on 07/18/2022 11/01/18   Shirlean Kelly, MD  oxyCODONE-acetaminophen (PERCOCET) 10-325 MG tablet Take 0.5 tablets by mouth 4 (four) times daily as needed for severe pain. 10/21/18   [provider]  OZEMPIC, 0.25 OR 0.5 MG/DOSE, 2 MG/1.5ML SOPN Inject 0.25-0.5 mg into the skin See admin instructions. Inject 0.25mg  under the skin every Monday for four weeks, then inject 0.5mg  weekly. 10/19/18   [provider]  TRUEPLUS LANCETS 28G MISC Use as directed 12/20/16   Vivianne Master, PA-C      Allergies    Motrin [ibuprofen], Protective adhesive powder, and Tape    Review of Systems   Review of Systems  Physical Exam Updated Vital Signs BP 131/64 Comment: right lower leg  Pulse 70   Temp 98.2 F (36.8 C) (Oral)   Resp 15   Ht 5\' 4"  (1.626 m)   Wt 104.1 kg   LMP 09/10/2017 (LMP Unknown)   SpO2 96%   BMI 39.39 kg/m  Physical Exam Constitutional: Alert and oriented. Well appearing and in no distress. Eyes: Conjunctivae are normal. ENT      Head: Normocephalic and atraumatic.      Ears: bilateral cerumen, no erythema, warmth or vesicles present Cardiovascular: S1, S2,  Normal and symmetric distal pulses are present in all extremities.Warm and well perfused. Respiratory: Normal respiratory effort. Breath sounds are normal. Gastrointestinal: Soft  Musculoskeletal: no edema Neurologic: Normal speech and language. PERRL. EOMI. No facial droop. 4/5 right shoulder/RUE strength and deformity of hand from contractures of fingers. 5/5 strength left upper extremity and bilateral lower extremities.  Reported decrease sensation of right face and RUE. Skin: Skin is warm, dry and intact. No rash noted. Psychiatric: Mood and affect are normal. Speech and behavior are  normal.  ED Results / Procedures / Treatments   Labs (all labs ordered are listed, but only abnormal results are displayed) Labs Reviewed  SURGICAL PCR SCREEN - Abnormal; Notable for the following components:      Result Value   MRSA, PCR POSITIVE (*)    Staphylococcus aureus POSITIVE (*)    All other components within normal limits  BASIC METABOLIC PANEL - Abnormal; Notable for the following components:   Glucose, Bld 124 (*)    Calcium 8.7 (*)    All other components within normal limits  CBC WITH DIFFERENTIAL/PLATELET - Abnormal; Notable for the following components:   Hemoglobin 15.1 (*)    All other components within normal limits  GLUCOSE, CAPILLARY - Abnormal; Notable for the following components:   Glucose-Capillary 107 (*)    All other components within normal limits  MAGNESIUM  HIV ANTIBODY (ROUTINE TESTING  W REFLEX)    EKG None  Radiology MR THORACIC SPINE W WO CONTRAST  Result Date: 08/11/2022 CLINICAL DATA:  62 year old female with paresthesia. Noncontrast brain and cervical spine MRI 0115 hours today demonstrating spinal cord syrinx. Prior ACDF. EXAM: MRI THORACIC WITHOUT AND WITH CONTRAST TECHNIQUE: Multiplanar and multiecho pulse sequences of the thoracic spine were obtained without and with intravenous contrast. CONTRAST:  10mL GADAVIST GADOBUTROL 1 MMOL/ML IV SOLN in conjunction with contrast enhanced imaging of the brain and cervical spine reported separately. COMPARISON:  Brain and cervical spine MRI this morning reported separately. FINDINGS: Limited cervical spine imaging: C5 through C7 ACDF hardware artifact. Cervical spinal cord syrinx, reported separately. Thoracic spine segmentation:  Appears to be normal. Alignment: Maintained thoracic kyphosis. No significant scoliosis or spondylolisthesis. Vertebrae: ACDF hardware artifact through C7. Thoracic Visualized bone marrow signal is within normal limits. No marrow edema or evidence of acute osseous abnormality.  Cord: Highly abnormal spinal cord continuing from the lower cervical levels, with relatively large central cord syrinx, up to 7 mm diameter (series 18, image 66 and series 15, image 11) and some upper associated cord expansion. Below the T2 level, this syrinx starts to become multilocular in appearance, and the spinal cord ventral surface starts to become inseparable from the ventral thecal sac. At the T6-T7 level over a segment of about 12 mm the cord morphology becomes unrecognizable (series 18, image 21) with expansion and generalized T2 and STIR hyperintensity. However, there is no associated spinal cord enhancement at any of these levels. Abruptly then at the mid T7 vertebral level the cord appears ventrally angulated and distorted (series 15, images 9 and 10) and there is abundant dorsal CSF, but no ventral CSF visible. The cord is inseparable from the back of the T7 vertebra (series 18, image 23). No abnormal enhancement here. No dural thickening or enhancement is identified. And just below that level from T8 through the conus medullaris at L1 the spinal cord location, signal, and morphology then are normal Paraspinal and other soft tissues: Thoracic paraspinal soft tissues appear negative. Visible chest and upper abdominal viscera are within normal limits. Disc levels: Generally mild for age thoracic spine degeneration, although intermittent mild thoracic disc bulging is present. And there is a central disc herniation at C7-T1 on series 18, image 3. But no significant thoracic spinal stenosis. IMPRESSION: 1. Pronounced Syrinx - as previously demonstrated from the cervicomedullary junction and throughout the cervical spine - continues in the upper thoracic spine and terminates abruptly at the mid T7 level, where the spinal cord is abruptly distorted, angulated ventrally, and inseparable from the back of the T7 vertebral body. Immediately above this cord deformity, the spinal cord is expanded and diffusely T2  hyperintense. But there is no abnormal enhancement of the cord or dura anywhere. And below T7 the thoracic spinal cord is normal to the conus medullaris. 2. 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 Recommend Neurosurgery consultation. Other considerations including a pronounced Dorsal Thoracic Arachnoid Web, and a short segment T7 level Low-grade Intramedullary Neoplasm were considered, but are less likely. 3. Generally mild for age thoracic spine degeneration underlying. No significant thoracic spinal stenosis. Electronically Signed   By: Odessa Fleming M.D.   On: 08/11/2022 05:43   MR CERVICAL SPINE W CONTRAST  Result Date: 08/11/2022 CLINICAL DATA:  62 year old female with paresthesia. Noncontrast brain and cervical spine MRI 0115 hours today demonstrating spinal cord syrinx. Prior ACDF. EXAM: MRI CERVICAL SPINE WITH CONTRAST TECHNIQUE: Multiplanar, multisequence  MR imaging of the cervical spine was performed following the administration of intravenous contrast. CONTRAST:  10 mL Gadavist in conjunction with contrast enhanced imaging of the brain reported separately. COMPARISON:  Noncontrast cervical spine MRI 0125 hours today. FINDINGS: Mild ACDF hardware susceptibility artifact C5-C6 and C6-C7. No convincing abnormal marrow enhancement. Long segment abnormal decreased central spinal cord T1 signal in keeping with the slightly asymmetric and heterogeneous syrinx demonstrated earlier today. No abnormal cervical spinal cord enhancement. No dural thickening or enhancement. Other visible neck soft tissues are within normal limits. IMPRESSION: 1. Long segment cervical spinal cord syrinx with no associated postcontrast enhancement. 2. C5-C6 and C6-C7 ACDF hardware. Electronically Signed   By: Odessa Fleming M.D.   On: 08/11/2022 05:16   MR BRAIN W CONTRAST  Result Date: 08/11/2022 CLINICAL DATA:  62 year old female with paresthesia. Noncontrast brain and cervical spine MRI 0115 hours today demonstrating  spinal cord syrinx. EXAM: MRI HEAD WITH CONTRAST TECHNIQUE: Multiplanar, multiecho pulse sequences of the brain and surrounding structures were obtained with intravenous contrast. CONTRAST:  10mL GADAVIST GADOBUTROL 1 MMOL/ML IV SOLN COMPARISON:  Noncontrast brain MRI 0115 hours today. FINDINGS: Stable cerebral morphology with no evidence of intracranial mass effect or ventriculomegaly. Small chronic infarcts in the left cerebellum, right corona radiata, and perivascular spaces versus chronic lacunae at the bilateral globus pallidus again noted. No abnormal enhancement identified. No dural thickening. Compared to the earlier noncontrast exam the major dural venous sinuses appear to be enhancing and patent. IMPRESSION: No abnormal enhancement of the brain. Electronically Signed   By: Odessa Fleming M.D.   On: 08/11/2022 05:13   MR BRAIN WO CONTRAST  Result Date: 08/11/2022 CLINICAL DATA:  Burning paresthesias on the right side the body EXAM: MRI HEAD WITHOUT CONTRAST MRI CERVICAL SPINE WITHOUT CONTRAST TECHNIQUE: Multiplanar, multiecho pulse sequences of the brain and surrounding structures, and cervical spine, to include the craniocervical junction and cervicothoracic junction, were obtained without intravenous contrast. COMPARISON:  01/06/2022 MRI head, 06/28/2017 MRI cervical spine FINDINGS: MRI HEAD FINDINGS Brain: No restricted diffusion to suggest acute or subacute infarct. No acute hemorrhage, mass, mass effect, or midline shift. No hydrocephalus or extra-axial collection. Normal pituitary and craniocervical junction. No hemosiderin deposition to suggest remote hemorrhage. Scattered T2 hyperintense signal in the periventricular white matter, likely the sequela of mild to moderate chronic small vessel ischemic disease. Vascular: Normal arterial flow voids. Skull and upper cervical spine: Normal marrow signal. Sinuses/Orbits: Mucosal thickening in the right maxillary sinus. Small mucous retention cyst in the left  maxillary sinus. No acute finding in the orbits. Other: Trace fluid in the mastoid air cells. MRI CERVICAL SPINE FINDINGS Evaluation is somewhat limited by motion artifact. Alignment: No significant listhesis. Vertebrae: No acute fracture, evidence of discitis, or suspicious osseous lesion. Status post ACDF C5-C7, which limits evaluation of these levels. Cord: Widening of the central canal of the spinal cord throughout the imaged cervical spine, compatible with a syrinx, which is new from prior MRI cervical spine but was visible at the inferior aspect of the field of view on the 01/06/2022 MRI head. The syrinx extends superiorly into the medulla (series 5, image 9; series 10, image 6 from the same day MRI head) and inferiorly off the inferior field of view, which terminates at the T3 level. The syrinx measures up to 6 x 11 mm (AP by TR) at the level of C5 (series 7, image 21), but may be larger in the thoracic spine, which is not included fully on  the axial field of view. No evidence of hemorrhage in the spinal cord. Posterior Fossa, vertebral arteries, paraspinal tissues: Negative. Disc levels: C2-C3: Left facet arthropathy. No spinal canal stenosis. Mild left neural foraminal narrowing. C3-C4: Mild disc bulge. Facet and uncovertebral hypertrophy. No spinal canal stenosis. Mild left neural foraminal narrowing. C4-C5: Mild disc bulge. No spinal canal stenosis or neural foraminal narrowing. C5-C6: Status post fusion. No significant spinal canal stenosis or neural foraminal narrowing. C6-C7: Status post fusion. No spinal canal stenosis. Mild bilateral neural foraminal narrowing. C7-T1: Central disc extrusion with minimal cranial migration, which indents the thecal sac and abuts the spinal cord. No spinal canal stenosis or neural foraminal narrowing. IMPRESSION: 1. Syrinx throughout the imaged cervical spine, which is new from prior MRI cervical spine from 2019 but was visible at the inferior aspect of the field of  view on the 01/06/2022 MRI head. The syrinx extends superiorly into the medulla and inferiorly into the thoracic spine. Recommend MRI of the thoracic spine without and with contrast to further evaluate the syrinx. 2. Status post ACDF C5-C7, with mild bilateral neural foraminal narrowing at C6-C7. 3. C2-C3 and C3-C4 mild left neural foraminal narrowing. 4. No acute intracranial process. No evidence of acute or subacute infarct. These results were called by telephone at the time of interpretation on 08/11/2022 at 2:05 am to provider Vivien Rossetti , who verbally acknowledged these results. Electronically Signed   By: Wiliam Ke M.D.   On: 08/11/2022 02:08   MR Cervical Spine Wo Contrast  Result Date: 08/11/2022 CLINICAL DATA:  Burning paresthesias on the right side the body EXAM: MRI HEAD WITHOUT CONTRAST MRI CERVICAL SPINE WITHOUT CONTRAST TECHNIQUE: Multiplanar, multiecho pulse sequences of the brain and surrounding structures, and cervical spine, to include the craniocervical junction and cervicothoracic junction, were obtained without intravenous contrast. COMPARISON:  01/06/2022 MRI head, 06/28/2017 MRI cervical spine FINDINGS: MRI HEAD FINDINGS Brain: No restricted diffusion to suggest acute or subacute infarct. No acute hemorrhage, mass, mass effect, or midline shift. No hydrocephalus or extra-axial collection. Normal pituitary and craniocervical junction. No hemosiderin deposition to suggest remote hemorrhage. Scattered T2 hyperintense signal in the periventricular white matter, likely the sequela of mild to moderate chronic small vessel ischemic disease. Vascular: Normal arterial flow voids. Skull and upper cervical spine: Normal marrow signal. Sinuses/Orbits: Mucosal thickening in the right maxillary sinus. Small mucous retention cyst in the left maxillary sinus. No acute finding in the orbits. Other: Trace fluid in the mastoid air cells. MRI CERVICAL SPINE FINDINGS Evaluation is somewhat limited by  motion artifact. Alignment: No significant listhesis. Vertebrae: No acute fracture, evidence of discitis, or suspicious osseous lesion. Status post ACDF C5-C7, which limits evaluation of these levels. Cord: Widening of the central canal of the spinal cord throughout the imaged cervical spine, compatible with a syrinx, which is new from prior MRI cervical spine but was visible at the inferior aspect of the field of view on the 01/06/2022 MRI head. The syrinx extends superiorly into the medulla (series 5, image 9; series 10, image 6 from the same day MRI head) and inferiorly off the inferior field of view, which terminates at the T3 level. The syrinx measures up to 6 x 11 mm (AP by TR) at the level of C5 (series 7, image 21), but may be larger in the thoracic spine, which is not included fully on the axial field of view. No evidence of hemorrhage in the spinal cord. Posterior Fossa, vertebral arteries, paraspinal tissues: Negative. Disc levels:  C2-C3: Left facet arthropathy. No spinal canal stenosis. Mild left neural foraminal narrowing. C3-C4: Mild disc bulge. Facet and uncovertebral hypertrophy. No spinal canal stenosis. Mild left neural foraminal narrowing. C4-C5: Mild disc bulge. No spinal canal stenosis or neural foraminal narrowing. C5-C6: Status post fusion. No significant spinal canal stenosis or neural foraminal narrowing. C6-C7: Status post fusion. No spinal canal stenosis. Mild bilateral neural foraminal narrowing. C7-T1: Central disc extrusion with minimal cranial migration, which indents the thecal sac and abuts the spinal cord. No spinal canal stenosis or neural foraminal narrowing. IMPRESSION: 1. Syrinx throughout the imaged cervical spine, which is new from prior MRI cervical spine from 2019 but was visible at the inferior aspect of the field of view on the 01/06/2022 MRI head. The syrinx extends superiorly into the medulla and inferiorly into the thoracic spine. Recommend MRI of the thoracic spine  without and with contrast to further evaluate the syrinx. 2. Status post ACDF C5-C7, with mild bilateral neural foraminal narrowing at C6-C7. 3. C2-C3 and C3-C4 mild left neural foraminal narrowing. 4. No acute intracranial process. No evidence of acute or subacute infarct. These results were called by telephone at the time of interpretation on 08/11/2022 at 2:05 am to provider Vivien Rossetti , who verbally acknowledged these results. Electronically Signed   By: Wiliam Ke M.D.   On: 08/11/2022 02:08   CT Angio Head Neck W WO CM  Result Date: 08/11/2022 CLINICAL DATA:  Right-sided paresthesias, burning sensation EXAM: CT ANGIOGRAPHY HEAD AND NECK WITH AND WITHOUT CONTRAST TECHNIQUE: Multidetector CT imaging of the head and neck was performed using the standard protocol during bolus administration of intravenous contrast. Multiplanar CT image reconstructions and MIPs were obtained to evaluate the vascular anatomy. Carotid stenosis measurements (when applicable) are obtained utilizing NASCET criteria, using the distal internal carotid diameter as the denominator. RADIATION DOSE REDUCTION: This exam was performed according to the departmental dose-optimization program which includes automated exposure control, adjustment of the mA and/or kV according to patient size and/or use of iterative reconstruction technique. CONTRAST:  75mL OMNIPAQUE IOHEXOL 350 MG/ML SOLN COMPARISON:  No prior CT head or CTA head and neck available; correlation is made with MRI head 01/06/2022 FINDINGS: CT HEAD FINDINGS Brain: No evidence of acute infarct, hemorrhage, mass, mass effect, or midline shift. No hydrocephalus or extra-axial fluid collection. Basal ganglia calcifications. Vascular: No hyperdense vessel. Skull: Negative for fracture or focal lesion. Sinuses/Orbits: Mucosal thickening in the right maxillary sinus, with some osseous hypertrophy, likely chronic right maxillary sinusitis. Small left maxillary mucous retention  cyst. No acute finding in the orbits. Other: The mastoid air cells are well aerated. CTA NECK FINDINGS Aortic arch: Standard branching. Imaged portion shows no evidence of aneurysm or dissection. No significant stenosis of the major arch vessel origins. Aortic atherosclerosis. Right carotid system: No evidence of dissection, occlusion, or hemodynamically significant stenosis (greater than 50%). Atherosclerotic disease at the bifurcation and in the proximal ICA is not hemodynamically significant. Left carotid system: No evidence of dissection, occlusion, or hemodynamically significant stenosis (greater than 50%). Atherosclerotic disease at the bifurcation and in the proximal ICA is not hemodynamically significant. Vertebral arteries: Evaluation of the proximal vertebral arteries is limited by beam hardening artifact. Within this limitation, no evidence of hemodynamically significant stenosis, dissection, or occlusion. Skeleton: No acute osseous abnormality. Degenerative changes in the cervical spine. ACDF C5-C7. Other neck: 6 negative. Upper chest: Centrilobular and paraseptal emphysema. Bronchial wall thickening. Review of the MIP images confirms the above findings CTA HEAD FINDINGS  Anterior circulation: Both internal carotid arteries are patent to the termini, with mild stenosis in distal right cavernous segment. A1 segments patent. Normal anterior communicating artery. Anterior cerebral arteries are patent to their distal aspects without significant stenosis. No M1 stenosis or occlusion. MCA branches perfused to their distal aspects without significant stenosis. Posterior circulation: Vertebral arteries patent to the vertebrobasilar junction without significant stenosis. Basilar patent to its distal aspect without significant stenosis. Superior cerebellar arteries patent proximally. Patent P1 segments. Near fetal origin of the left PCA from a prominent left posterior communicating artery. The right posterior  communicating artery is also patent. PCAs perfused to their distal aspects without significant stenosis. Venous sinuses: As permitted by contrast timing, patent. Anatomic variants: None significant. Review of the MIP images confirms the above findings IMPRESSION: 1. No acute intracranial process. 2. No intracranial large vessel occlusion or significant stenosis. Mild stenosis in the distal right cavernous segment. 3. Evaluation of the proximal vertebral arteries is limited by artifact. Within this limitation, no hemodynamically significant stenosis in the neck. 4. Aortic atherosclerosis and emphysema. Aortic Atherosclerosis (ICD10-I70.0) and Emphysema (ICD10-J43.9). Electronically Signed   By: Wiliam Ke M.D.   On: 08/11/2022 01:05    Procedures Procedures    Medications Ordered in ED Medications  acetaminophen (TYLENOL) tablet 650 mg ( Oral MAR Hold 08/11/22 1455)    Or  acetaminophen (TYLENOL) suppository 650 mg ( Rectal MAR Hold 08/11/22 1455)  HYDROmorphone (DILAUDID) injection 0.5-1 mg ( Intravenous MAR Hold 08/11/22 1455)  methocarbamol (ROBAXIN) 500 mg in dextrose 5 % 50 mL IVPB ( Intravenous MAR Hold 08/11/22 1455)  ondansetron (ZOFRAN) tablet 4 mg ( Oral MAR Hold 08/11/22 1455)    Or  ondansetron (ZOFRAN) injection 4 mg ( Intravenous MAR Hold 08/11/22 1455)  lactated ringers infusion ( Intravenous Restarted 08/11/22 1600)  mupirocin ointment (BACTROBAN) 2 % 1 Application (has no administration in time range)  Chlorhexidine Gluconate Cloth 2 % PADS 6 each (has no administration in time range)  insulin aspart (novoLOG) injection 0-14 Units ( Subcutaneous Not Given 08/11/22 1546)  0.9 % irrigation (POUR BTL) (1,000 mLs Irrigation Given 08/11/22 1655)  Surgifoam 100 with Thrombin 20,000 units (20 ml) topical solution ( Topical Given 08/11/22 1655)  Surgifoam 1 Gm with Thrombin 5,000 units (5 ml) topical solution ( Topical Given 08/11/22 1704)  iohexol (OMNIPAQUE) 350 MG/ML injection 75 mL (75 mLs  Intravenous Contrast Given 08/11/22 0046)  gadobutrol (GADAVIST) 1 MMOL/ML injection 10 mL (10 mLs Intravenous Contrast Given 08/11/22 0503)  fentaNYL (SUBLIMAZE) injection 50 mcg (50 mcg Intravenous Given 08/11/22 0439)  ondansetron (ZOFRAN) injection 4 mg (4 mg Intravenous Given 08/11/22 0440)  ceFAZolin (ANCEF) IVPB 2g/100 mL premix (2 g Intravenous Given 08/11/22 1615)  chlorhexidine (PERIDEX) 0.12 % solution 15 mL (15 mLs Mouth/Throat Given 08/11/22 1528)    Or  Oral care mouth rinse ( Mouth Rinse See Alternative 08/11/22 1528)    ED Course/ Medical Decision Making/ A&P Clinical Course as of 08/11/22 1723  Fri Aug 11, 2022  0208 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  Recommending C-spine with contrast as well as T-spine with and without contrast. [VB]  0259 S/w Patrici Ranks PA on-call for neurosurgery who recommends outpatient follow-up with neurosurgery for possible drainage.  Also recommends neurology consult. [VB]  0305 Dr Amada Jupiter recommends neurosurgery follow up, lyrica or gabapentin for pain control.  [VB]  0716 Re-consulted neurosurgery for formal consult regarding findings MRI [VB]  0731 I spoke  to Hildred Priest NSGY PA who reports they will consult on patient this morning regarding MR findings [MT]    Clinical Course User Index [MT] Trifan, Kermit Balo, MD [VB] Mardene Sayer, MD                             Medical Decision Making  [name] is a 62 y.o. female.  With PMH of DM, HTN, HLD, CHF, COPD, s/p two-level C5-6 and C6-7 ACDF  in 2020 who presents with burning sensation of right side of body and face ongoing for the past month but worsening over the past few days.    Patient symptoms have been ongoing for many days to possibly months.  No code stroke called.  Her symptoms concerning for possible toxic metabolic etiology, CVA, severe cervical radiculopathy or impingement, discitis among multiple other etiologies.  Her  labs reviewed by me unremarkable with normal potassium 4.1 glucose 124 creatinine 0.97 within normal limits.  Hemoglobin 15.1.  Normal white blood cell count 7.4.  CTA head and neck obtained which showed no ICH personally viewed by me as well as no severe stenosis or aneurysm.  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  This is likely underlying cause of patient's symptoms.  I have consulted neurosurgery for formal evaluation and further recommendations.  Amount and/or Complexity of Data Reviewed Labs: ordered. Radiology: ordered.  Risk Prescription drug management. Decision regarding hospitalization.     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

## 2022-08-10 NOTE — ED Triage Notes (Signed)
Pt c/o burning sensation on right side of body and facex3d. Pt denies any other sx.

## 2022-08-11 ENCOUNTER — Inpatient Hospital Stay (HOSPITAL_COMMUNITY): Payer: Medicare HMO

## 2022-08-11 ENCOUNTER — Encounter (HOSPITAL_COMMUNITY): Payer: Self-pay | Admitting: Neurosurgery

## 2022-08-11 ENCOUNTER — Emergency Department (HOSPITAL_COMMUNITY): Payer: Medicare HMO

## 2022-08-11 ENCOUNTER — Encounter (HOSPITAL_COMMUNITY)
Admission: EM | Disposition: A | Discharge status: Home Health | Payer: Self-pay | Source: Home / Self Care | Attending: Neurosurgery

## 2022-08-11 ENCOUNTER — Inpatient Hospital Stay (HOSPITAL_COMMUNITY): Payer: Medicare HMO | Admitting: Anesthesiology

## 2022-08-11 ENCOUNTER — Other Ambulatory Visit: Payer: Self-pay

## 2022-08-11 DIAGNOSIS — K219 Gastro-esophageal reflux disease without esophagitis: Secondary | ICD-10-CM | POA: Diagnosis present

## 2022-08-11 DIAGNOSIS — M4714 Other spondylosis with myelopathy, thoracic region: Secondary | ICD-10-CM | POA: Diagnosis present

## 2022-08-11 DIAGNOSIS — Z7984 Long term (current) use of oral hypoglycemic drugs: Secondary | ICD-10-CM | POA: Diagnosis not present

## 2022-08-11 DIAGNOSIS — F17210 Nicotine dependence, cigarettes, uncomplicated: Secondary | ICD-10-CM | POA: Diagnosis present

## 2022-08-11 DIAGNOSIS — J449 Chronic obstructive pulmonary disease, unspecified: Secondary | ICD-10-CM

## 2022-08-11 DIAGNOSIS — I110 Hypertensive heart disease with heart failure: Secondary | ICD-10-CM

## 2022-08-11 DIAGNOSIS — E119 Type 2 diabetes mellitus without complications: Secondary | ICD-10-CM | POA: Diagnosis present

## 2022-08-11 DIAGNOSIS — E039 Hypothyroidism, unspecified: Secondary | ICD-10-CM | POA: Diagnosis present

## 2022-08-11 DIAGNOSIS — F32A Depression, unspecified: Secondary | ICD-10-CM | POA: Diagnosis present

## 2022-08-11 DIAGNOSIS — Z886 Allergy status to analgesic agent status: Secondary | ICD-10-CM | POA: Diagnosis not present

## 2022-08-11 DIAGNOSIS — G4730 Sleep apnea, unspecified: Secondary | ICD-10-CM | POA: Diagnosis present

## 2022-08-11 DIAGNOSIS — M50223 Other cervical disc displacement at C6-C7 level: Secondary | ICD-10-CM | POA: Diagnosis present

## 2022-08-11 DIAGNOSIS — Z8261 Family history of arthritis: Secondary | ICD-10-CM | POA: Diagnosis not present

## 2022-08-11 DIAGNOSIS — J4489 Other specified chronic obstructive pulmonary disease: Secondary | ICD-10-CM | POA: Diagnosis present

## 2022-08-11 DIAGNOSIS — Q068 Other specified congenital malformations of spinal cord: Secondary | ICD-10-CM | POA: Diagnosis not present

## 2022-08-11 DIAGNOSIS — Z91048 Other nonmedicinal substance allergy status: Secondary | ICD-10-CM | POA: Diagnosis not present

## 2022-08-11 DIAGNOSIS — M159 Polyosteoarthritis, unspecified: Secondary | ICD-10-CM | POA: Diagnosis present

## 2022-08-11 DIAGNOSIS — G2581 Restless legs syndrome: Secondary | ICD-10-CM | POA: Diagnosis present

## 2022-08-11 DIAGNOSIS — G950 Syringomyelia and syringobulbia: Secondary | ICD-10-CM

## 2022-08-11 DIAGNOSIS — Z79899 Other long term (current) drug therapy: Secondary | ICD-10-CM | POA: Diagnosis not present

## 2022-08-11 DIAGNOSIS — Z825 Family history of asthma and other chronic lower respiratory diseases: Secondary | ICD-10-CM | POA: Diagnosis not present

## 2022-08-11 DIAGNOSIS — E785 Hyperlipidemia, unspecified: Secondary | ICD-10-CM | POA: Diagnosis present

## 2022-08-11 DIAGNOSIS — Z79890 Hormone replacement therapy (postmenopausal): Secondary | ICD-10-CM | POA: Diagnosis not present

## 2022-08-11 DIAGNOSIS — I509 Heart failure, unspecified: Secondary | ICD-10-CM

## 2022-08-11 DIAGNOSIS — Z833 Family history of diabetes mellitus: Secondary | ICD-10-CM | POA: Diagnosis not present

## 2022-08-11 HISTORY — PX: LAMINECTOMY: SHX219

## 2022-08-11 LAB — GLUCOSE, CAPILLARY
Glucose-Capillary: 107 mg/dL — ABNORMAL HIGH (ref 70–99)
Glucose-Capillary: 150 mg/dL — ABNORMAL HIGH (ref 70–99)

## 2022-08-11 LAB — HIV ANTIBODY (ROUTINE TESTING W REFLEX): HIV Screen 4th Generation wRfx: NONREACTIVE

## 2022-08-11 LAB — HEMOGLOBIN A1C
Hgb A1c MFr Bld: 7.5 % — ABNORMAL HIGH (ref 4.8–5.6)
Mean Plasma Glucose: 168.55 mg/dL

## 2022-08-11 LAB — SURGICAL PCR SCREEN
MRSA, PCR: POSITIVE — AB
Staphylococcus aureus: POSITIVE — AB

## 2022-08-11 LAB — MAGNESIUM: Magnesium: 2.4 mg/dL (ref 1.7–2.4)

## 2022-08-11 SURGERY — THORACIC LAMINECTOMY FOR TUMOR
Anesthesia: General | Site: Back

## 2022-08-11 MED ORDER — PROPOFOL 10 MG/ML IV BOLUS
INTRAVENOUS | Status: AC
  Filled 2022-08-11: qty 20

## 2022-08-11 MED ORDER — THROMBIN 5000 UNITS EX SOLR
CUTANEOUS | Status: AC
  Filled 2022-08-11: qty 5000

## 2022-08-11 MED ORDER — EPHEDRINE 5 MG/ML INJ
INTRAVENOUS | Status: AC
  Filled 2022-08-11: qty 5

## 2022-08-11 MED ORDER — THROMBIN 20000 UNITS EX SOLR: CUTANEOUS | Status: DC | PRN

## 2022-08-11 MED ORDER — AMISULPRIDE (ANTIEMETIC) 5 MG/2ML IV SOLN: 10.0000 mg | Freq: Once | INTRAVENOUS | Status: DC | PRN

## 2022-08-11 MED ORDER — ONDANSETRON HCL 4 MG/2ML IJ SOLN
INTRAMUSCULAR | Status: DC | PRN
  Administered 2022-08-11: 4 mg via INTRAVENOUS

## 2022-08-11 MED ORDER — BUPIVACAINE HCL (PF) 0.25 % IJ SOLN
INTRAMUSCULAR | Status: AC
  Filled 2022-08-11: qty 30

## 2022-08-11 MED ORDER — LIDOCAINE 2% (20 MG/ML) 5 ML SYRINGE
INTRAMUSCULAR | Status: AC
  Filled 2022-08-11: qty 5

## 2022-08-11 MED ORDER — ACETAMINOPHEN 10 MG/ML IV SOLN
INTRAVENOUS | Status: AC
  Filled 2022-08-11: qty 100

## 2022-08-11 MED ORDER — HYDROMORPHONE HCL 1 MG/ML IJ SOLN
0.5000 mg | INTRAMUSCULAR | Status: DC | PRN
  Administered 2022-08-11 – 2022-08-15 (×8): 1 mg via INTRAVENOUS
  Filled 2022-08-11 (×8): qty 1

## 2022-08-11 MED ORDER — SODIUM CHLORIDE 0.9% FLUSH: 3.0000 mL | INTRAVENOUS | Status: DC | PRN

## 2022-08-11 MED ORDER — METHOCARBAMOL 1000 MG/10ML IJ SOLN: 500.0000 mg | Freq: Four times a day (QID) | INTRAVENOUS | Status: DC | PRN

## 2022-08-11 MED ORDER — LEVOTHYROXINE SODIUM 25 MCG PO TABS
125.0000 ug | ORAL_TABLET | Freq: Every day | ORAL | Status: DC
  Administered 2022-08-12 – 2022-08-16 (×5): 125 ug via ORAL
  Filled 2022-08-11 (×5): qty 1

## 2022-08-11 MED ORDER — ESMOLOL HCL 100 MG/10ML IV SOLN
INTRAVENOUS | Status: AC
  Filled 2022-08-11: qty 10

## 2022-08-11 MED ORDER — CHLORHEXIDINE GLUCONATE 0.12 % MT SOLN
OROMUCOSAL | Status: AC
  Administered 2022-08-11: 15 mL via OROMUCOSAL
  Filled 2022-08-11: qty 15

## 2022-08-11 MED ORDER — ACETAMINOPHEN 325 MG PO TABS: 650.0000 mg | ORAL_TABLET | Freq: Four times a day (QID) | ORAL | Status: DC | PRN

## 2022-08-11 MED ORDER — PHENOL 1.4 % MT LIQD: 1.0000 | OROMUCOSAL | Status: DC | PRN

## 2022-08-11 MED ORDER — DEXAMETHASONE 4 MG PO TABS
4.0000 mg | ORAL_TABLET | Freq: Four times a day (QID) | ORAL | Status: DC
  Administered 2022-08-12 – 2022-08-16 (×16): 4 mg via ORAL
  Filled 2022-08-11 (×16): qty 1

## 2022-08-11 MED ORDER — FENTANYL CITRATE (PF) 250 MCG/5ML IJ SOLN
INTRAMUSCULAR | Status: DC | PRN
  Administered 2022-08-11: 50 ug via INTRAVENOUS
  Administered 2022-08-11 (×2): 100 ug via INTRAVENOUS

## 2022-08-11 MED ORDER — ACETAMINOPHEN 650 MG RE SUPP: 650.0000 mg | Freq: Four times a day (QID) | RECTAL | Status: DC | PRN

## 2022-08-11 MED ORDER — GADOBUTROL 1 MMOL/ML IV SOLN
10.0000 mL | Freq: Once | INTRAVENOUS | Status: AC | PRN
  Administered 2022-08-11: 10 mL via INTRAVENOUS

## 2022-08-11 MED ORDER — LIDOCAINE 2% (20 MG/ML) 5 ML SYRINGE
INTRAMUSCULAR | Status: DC | PRN
  Administered 2022-08-11: 60 mg via INTRAVENOUS

## 2022-08-11 MED ORDER — HYDROXYZINE HCL 25 MG PO TABS
50.0000 mg | ORAL_TABLET | Freq: Every day | ORAL | Status: DC
  Administered 2022-08-11 – 2022-08-15 (×5): 50 mg via ORAL
  Filled 2022-08-11 (×5): qty 2

## 2022-08-11 MED ORDER — KETOROLAC TROMETHAMINE 15 MG/ML IJ SOLN
15.0000 mg | Freq: Four times a day (QID) | INTRAMUSCULAR | Status: AC
  Administered 2022-08-11 – 2022-08-12 (×3): 15 mg via INTRAVENOUS
  Filled 2022-08-11 (×3): qty 1

## 2022-08-11 MED ORDER — MENTHOL 3 MG MT LOZG: 1.0000 | LOZENGE | OROMUCOSAL | Status: DC | PRN

## 2022-08-11 MED ORDER — INSULIN ASPART 100 UNIT/ML IJ SOLN: 0.0000 [IU] | INTRAMUSCULAR | Status: DC | PRN

## 2022-08-11 MED ORDER — CEFAZOLIN SODIUM-DEXTROSE 1-4 GM/50ML-% IV SOLN
1.0000 g | Freq: Three times a day (TID) | INTRAVENOUS | Status: AC
  Administered 2022-08-11 – 2022-08-12 (×2): 1 g via INTRAVENOUS
  Filled 2022-08-11 (×2): qty 50

## 2022-08-11 MED ORDER — 0.9 % SODIUM CHLORIDE (POUR BTL) OPTIME
TOPICAL | Status: DC | PRN
  Administered 2022-08-11: 1000 mL

## 2022-08-11 MED ORDER — PHENYLEPHRINE HCL-NACL 20-0.9 MG/250ML-% IV SOLN
INTRAVENOUS | Status: AC
  Filled 2022-08-11: qty 250

## 2022-08-11 MED ORDER — DEXAMETHASONE SODIUM PHOSPHATE 10 MG/ML IJ SOLN
INTRAMUSCULAR | Status: AC
  Filled 2022-08-11: qty 1

## 2022-08-11 MED ORDER — CHLORHEXIDINE GLUCONATE 0.12 % MT SOLN: 15.0000 mL | Freq: Once | OROMUCOSAL | Status: AC

## 2022-08-11 MED ORDER — PHENYLEPHRINE HCL-NACL 20-0.9 MG/250ML-% IV SOLN
INTRAVENOUS | Status: DC | PRN
  Administered 2022-08-11: 20 ug/min via INTRAVENOUS

## 2022-08-11 MED ORDER — HYDROCHLOROTHIAZIDE 25 MG PO TABS
25.0000 mg | ORAL_TABLET | Freq: Every day | ORAL | Status: DC
  Administered 2022-08-12 – 2022-08-16 (×5): 25 mg via ORAL
  Filled 2022-08-11 (×5): qty 1

## 2022-08-11 MED ORDER — ONDANSETRON HCL 4 MG/2ML IJ SOLN: 4.0000 mg | Freq: Four times a day (QID) | INTRAMUSCULAR | Status: DC | PRN

## 2022-08-11 MED ORDER — LACTATED RINGERS IV SOLN: INTRAVENOUS | Status: DC

## 2022-08-11 MED ORDER — PROPOFOL 10 MG/ML IV BOLUS
INTRAVENOUS | Status: DC | PRN
  Administered 2022-08-11: 200 mg via INTRAVENOUS

## 2022-08-11 MED ORDER — THROMBIN 20000 UNITS EX SOLR
CUTANEOUS | Status: AC
  Filled 2022-08-11: qty 20000

## 2022-08-11 MED ORDER — MAGNESIUM OXIDE -MG SUPPLEMENT 400 (240 MG) MG PO TABS
400.0000 mg | ORAL_TABLET | Freq: Every day | ORAL | Status: DC
  Administered 2022-08-12 – 2022-08-16 (×5): 400 mg via ORAL
  Filled 2022-08-11 (×5): qty 1

## 2022-08-11 MED ORDER — CEFAZOLIN SODIUM-DEXTROSE 2-4 GM/100ML-% IV SOLN
INTRAVENOUS | Status: AC
  Filled 2022-08-11: qty 100

## 2022-08-11 MED ORDER — GLYCOPYRROLATE PF 0.2 MG/ML IJ SOSY
PREFILLED_SYRINGE | INTRAMUSCULAR | Status: AC
  Filled 2022-08-11: qty 1

## 2022-08-11 MED ORDER — HYDROCODONE-ACETAMINOPHEN 5-325 MG PO TABS: 1.0000 | ORAL_TABLET | ORAL | Status: DC | PRN

## 2022-08-11 MED ORDER — PHENYLEPHRINE 80 MCG/ML (10ML) SYRINGE FOR IV PUSH (FOR BLOOD PRESSURE SUPPORT)
PREFILLED_SYRINGE | INTRAVENOUS | Status: DC | PRN
  Administered 2022-08-11 (×2): 160 ug via INTRAVENOUS
  Administered 2022-08-11: 80 ug via INTRAVENOUS
  Administered 2022-08-11: 160 ug via INTRAVENOUS

## 2022-08-11 MED ORDER — BUDESON-GLYCOPYRROL-FORMOTEROL 160-9-4.8 MCG/ACT IN AERO: 2.0000 | INHALATION_SPRAY | Freq: Two times a day (BID) | RESPIRATORY_TRACT | Status: DC

## 2022-08-11 MED ORDER — ONDANSETRON HCL 4 MG/2ML IJ SOLN
INTRAMUSCULAR | Status: AC
  Filled 2022-08-11: qty 2

## 2022-08-11 MED ORDER — MUPIROCIN 2 % EX OINT
1.0000 | TOPICAL_OINTMENT | Freq: Two times a day (BID) | CUTANEOUS | Status: AC
  Administered 2022-08-11 – 2022-08-16 (×10): 1 via NASAL
  Filled 2022-08-11: qty 22

## 2022-08-11 MED ORDER — FLUTICASONE FUROATE-VILANTEROL 100-25 MCG/ACT IN AEPB
1.0000 | INHALATION_SPRAY | Freq: Every day | RESPIRATORY_TRACT | Status: DC
  Administered 2022-08-13 – 2022-08-16 (×4): 1 via RESPIRATORY_TRACT
  Filled 2022-08-11: qty 28

## 2022-08-11 MED ORDER — SUGAMMADEX SODIUM 200 MG/2ML IV SOLN
INTRAVENOUS | Status: DC | PRN
  Administered 2022-08-11: 200 mg via INTRAVENOUS

## 2022-08-11 MED ORDER — ORAL CARE MOUTH RINSE: 15.0000 mL | Freq: Once | OROMUCOSAL | Status: AC

## 2022-08-11 MED ORDER — METHOCARBAMOL 500 MG PO TABS
500.0000 mg | ORAL_TABLET | Freq: Four times a day (QID) | ORAL | Status: DC | PRN
  Administered 2022-08-12 – 2022-08-14 (×3): 500 mg via ORAL
  Filled 2022-08-11 (×3): qty 1

## 2022-08-11 MED ORDER — FLEET ENEMA 7-19 GM/118ML RE ENEM: 1.0000 | ENEMA | Freq: Once | RECTAL | Status: DC | PRN

## 2022-08-11 MED ORDER — HYDROMORPHONE HCL 1 MG/ML IJ SOLN
INTRAMUSCULAR | Status: AC
  Filled 2022-08-11: qty 2

## 2022-08-11 MED ORDER — HYDRALAZINE HCL 20 MG/ML IJ SOLN
INTRAMUSCULAR | Status: AC
  Filled 2022-08-11: qty 1

## 2022-08-11 MED ORDER — LABETALOL HCL 5 MG/ML IV SOLN
INTRAVENOUS | Status: AC
  Filled 2022-08-11: qty 4

## 2022-08-11 MED ORDER — ROCURONIUM BROMIDE 10 MG/ML (PF) SYRINGE
PREFILLED_SYRINGE | INTRAVENOUS | Status: DC | PRN
  Administered 2022-08-11: 30 mg via INTRAVENOUS
  Administered 2022-08-11 (×2): 50 mg via INTRAVENOUS

## 2022-08-11 MED ORDER — CALCIUM CARBONATE 1250 (500 CA) MG PO TABS
1250.0000 mg | ORAL_TABLET | Freq: Every day | ORAL | Status: DC
  Administered 2022-08-12 – 2022-08-16 (×5): 1250 mg via ORAL
  Filled 2022-08-11 (×5): qty 1

## 2022-08-11 MED ORDER — SODIUM CHLORIDE 0.9 % IV SOLN
250.0000 mL | INTRAVENOUS | Status: DC
  Administered 2022-08-11: 250 mL via INTRAVENOUS

## 2022-08-11 MED ORDER — ONDANSETRON HCL 4 MG PO TABS: 4.0000 mg | ORAL_TABLET | Freq: Four times a day (QID) | ORAL | Status: DC | PRN

## 2022-08-11 MED ORDER — OXYCODONE HCL 5 MG PO TABS
10.0000 mg | ORAL_TABLET | ORAL | Status: DC | PRN
  Administered 2022-08-12 – 2022-08-16 (×12): 10 mg via ORAL
  Filled 2022-08-11 (×12): qty 2

## 2022-08-11 MED ORDER — ALBUTEROL SULFATE (2.5 MG/3ML) 0.083% IN NEBU: 3.0000 mL | INHALATION_SOLUTION | Freq: Four times a day (QID) | RESPIRATORY_TRACT | Status: DC | PRN

## 2022-08-11 MED ORDER — FLUTICASONE PROPIONATE 50 MCG/ACT NA SUSP
2.0000 | Freq: Every day | NASAL | Status: DC
  Administered 2022-08-16: 2 via NASAL
  Filled 2022-08-11 (×2): qty 16

## 2022-08-11 MED ORDER — EMPAGLIFLOZIN 25 MG PO TABS
25.0000 mg | ORAL_TABLET | Freq: Every day | ORAL | Status: DC
  Administered 2022-08-12 – 2022-08-16 (×5): 25 mg via ORAL
  Filled 2022-08-11 (×5): qty 1

## 2022-08-11 MED ORDER — LABETALOL HCL 5 MG/ML IV SOLN
INTRAVENOUS | Status: DC | PRN
  Administered 2022-08-11: 5 mg via INTRAVENOUS

## 2022-08-11 MED ORDER — IOHEXOL 350 MG/ML SOLN
75.0000 mL | Freq: Once | INTRAVENOUS | Status: AC | PRN
  Administered 2022-08-11: 75 mL via INTRAVENOUS

## 2022-08-11 MED ORDER — INSULIN ASPART 100 UNIT/ML IJ SOLN
0.0000 [IU] | Freq: Three times a day (TID) | INTRAMUSCULAR | Status: DC
  Administered 2022-08-12: 15 [IU] via SUBCUTANEOUS
  Administered 2022-08-12 (×2): 7 [IU] via SUBCUTANEOUS
  Administered 2022-08-13: 15 [IU] via SUBCUTANEOUS
  Administered 2022-08-13: 7 [IU] via SUBCUTANEOUS
  Administered 2022-08-13: 15 [IU] via SUBCUTANEOUS
  Administered 2022-08-14: 20 [IU] via SUBCUTANEOUS
  Administered 2022-08-14: 11 [IU] via SUBCUTANEOUS
  Administered 2022-08-15: 7 [IU] via SUBCUTANEOUS
  Administered 2022-08-15: 11 [IU] via SUBCUTANEOUS
  Administered 2022-08-15: 20 [IU] via SUBCUTANEOUS
  Administered 2022-08-16: 7 [IU] via SUBCUTANEOUS

## 2022-08-11 MED ORDER — CHLORHEXIDINE GLUCONATE CLOTH 2 % EX PADS
6.0000 | MEDICATED_PAD | Freq: Every day | CUTANEOUS | Status: DC
  Administered 2022-08-12 – 2022-08-16 (×3): 6 via TOPICAL

## 2022-08-11 MED ORDER — AZELASTINE HCL 0.1 % NA SOLN: 1.0000 | Freq: Two times a day (BID) | NASAL | Status: DC | PRN

## 2022-08-11 MED ORDER — MIDAZOLAM HCL 2 MG/2ML IJ SOLN
INTRAMUSCULAR | Status: AC
  Filled 2022-08-11: qty 2

## 2022-08-11 MED ORDER — CEFAZOLIN SODIUM-DEXTROSE 2-4 GM/100ML-% IV SOLN
2.0000 g | INTRAVENOUS | Status: AC
  Administered 2022-08-11: 2 g via INTRAVENOUS
  Filled 2022-08-11: qty 100

## 2022-08-11 MED ORDER — SUCCINYLCHOLINE CHLORIDE 200 MG/10ML IV SOSY
PREFILLED_SYRINGE | INTRAVENOUS | Status: AC
  Filled 2022-08-11: qty 10

## 2022-08-11 MED ORDER — HYDROMORPHONE HCL 1 MG/ML IJ SOLN
0.2500 mg | INTRAMUSCULAR | Status: DC | PRN
  Administered 2022-08-11 (×4): 0.5 mg via INTRAVENOUS

## 2022-08-11 MED ORDER — UMECLIDINIUM BROMIDE 62.5 MCG/ACT IN AEPB
1.0000 | INHALATION_SPRAY | Freq: Every day | RESPIRATORY_TRACT | Status: DC
  Administered 2022-08-13 – 2022-08-16 (×4): 1 via RESPIRATORY_TRACT
  Filled 2022-08-11: qty 7

## 2022-08-11 MED ORDER — SODIUM CHLORIDE 0.9% FLUSH
3.0000 mL | Freq: Two times a day (BID) | INTRAVENOUS | Status: DC
  Administered 2022-08-11 – 2022-08-15 (×9): 3 mL via INTRAVENOUS

## 2022-08-11 MED ORDER — ONDANSETRON HCL 4 MG/2ML IJ SOLN
4.0000 mg | Freq: Once | INTRAMUSCULAR | Status: AC
  Administered 2022-08-11: 4 mg via INTRAVENOUS
  Filled 2022-08-11: qty 2

## 2022-08-11 MED ORDER — THROMBIN 5000 UNITS EX SOLR: OROMUCOSAL | Status: DC | PRN

## 2022-08-11 MED ORDER — ROCURONIUM BROMIDE 10 MG/ML (PF) SYRINGE
PREFILLED_SYRINGE | INTRAVENOUS | Status: AC
  Filled 2022-08-11: qty 10

## 2022-08-11 MED ORDER — DOCUSATE SODIUM 100 MG PO CAPS
100.0000 mg | ORAL_CAPSULE | Freq: Two times a day (BID) | ORAL | Status: DC
  Administered 2022-08-11 – 2022-08-16 (×9): 100 mg via ORAL
  Filled 2022-08-11 (×10): qty 1

## 2022-08-11 MED ORDER — PHENYLEPHRINE 80 MCG/ML (10ML) SYRINGE FOR IV PUSH (FOR BLOOD PRESSURE SUPPORT)
PREFILLED_SYRINGE | INTRAVENOUS | Status: AC
  Filled 2022-08-11: qty 10

## 2022-08-11 MED ORDER — FENTANYL CITRATE PF 50 MCG/ML IJ SOSY
50.0000 ug | PREFILLED_SYRINGE | Freq: Once | INTRAMUSCULAR | Status: AC
  Administered 2022-08-11: 50 ug via INTRAVENOUS
  Filled 2022-08-11: qty 1

## 2022-08-11 MED ORDER — BUPIVACAINE HCL (PF) 0.25 % IJ SOLN
INTRAMUSCULAR | Status: DC | PRN
  Administered 2022-08-11: 20 mL

## 2022-08-11 MED ORDER — ACETAMINOPHEN 10 MG/ML IV SOLN
INTRAVENOUS | Status: DC | PRN
  Administered 2022-08-11: 1000 mg via INTRAVENOUS

## 2022-08-11 MED ORDER — LORATADINE 10 MG PO TABS
10.0000 mg | ORAL_TABLET | Freq: Every day | ORAL | Status: DC
  Administered 2022-08-12 – 2022-08-16 (×5): 10 mg via ORAL
  Filled 2022-08-11 (×5): qty 1

## 2022-08-11 MED ORDER — ATORVASTATIN CALCIUM 10 MG PO TABS
20.0000 mg | ORAL_TABLET | Freq: Every day | ORAL | Status: DC
  Administered 2022-08-12 – 2022-08-16 (×5): 20 mg via ORAL
  Filled 2022-08-11 (×5): qty 2

## 2022-08-11 MED ORDER — PANTOPRAZOLE SODIUM 40 MG PO TBEC
40.0000 mg | DELAYED_RELEASE_TABLET | Freq: Every day | ORAL | Status: DC
  Administered 2022-08-12 – 2022-08-16 (×5): 40 mg via ORAL
  Filled 2022-08-11 (×5): qty 1

## 2022-08-11 MED ORDER — FENTANYL CITRATE (PF) 250 MCG/5ML IJ SOLN
INTRAMUSCULAR | Status: AC
  Filled 2022-08-11: qty 5

## 2022-08-11 MED ORDER — DEXAMETHASONE SODIUM PHOSPHATE 10 MG/ML IJ SOLN
INTRAMUSCULAR | Status: DC | PRN
  Administered 2022-08-11: 10 mg via INTRAVENOUS

## 2022-08-11 MED ORDER — DEXAMETHASONE SODIUM PHOSPHATE 4 MG/ML IJ SOLN
4.0000 mg | Freq: Four times a day (QID) | INTRAMUSCULAR | Status: DC
  Administered 2022-08-11 – 2022-08-15 (×3): 4 mg via INTRAVENOUS
  Filled 2022-08-11 (×4): qty 1

## 2022-08-11 MED ORDER — GABAPENTIN 300 MG PO CAPS
300.0000 mg | ORAL_CAPSULE | Freq: Every day | ORAL | Status: DC
  Administered 2022-08-11 – 2022-08-15 (×5): 300 mg via ORAL
  Filled 2022-08-11 (×5): qty 1

## 2022-08-11 MED ORDER — VASOPRESSIN 20 UNIT/ML IV SOLN
INTRAVENOUS | Status: AC
  Filled 2022-08-11: qty 1

## 2022-08-11 MED ORDER — POLYETHYLENE GLYCOL 3350 17 G PO PACK: 17.0000 g | PACK | Freq: Every day | ORAL | Status: DC | PRN

## 2022-08-11 MED ORDER — IPRATROPIUM-ALBUTEROL 0.5-2.5 (3) MG/3ML IN SOLN: 3.0000 mL | Freq: Four times a day (QID) | RESPIRATORY_TRACT | Status: DC | PRN

## 2022-08-11 MED ORDER — FLUOXETINE HCL 20 MG PO CAPS
20.0000 mg | ORAL_CAPSULE | Freq: Every day | ORAL | Status: DC
  Administered 2022-08-12 – 2022-08-16 (×5): 20 mg via ORAL
  Filled 2022-08-11 (×5): qty 1

## 2022-08-11 SURGICAL SUPPLY — 65 items
ADH SKN CLS APL DERMABOND .7 (GAUZE/BANDAGES/DRESSINGS) ×1
APL SKNCLS STERI-STRIP NONHPOA (GAUZE/BANDAGES/DRESSINGS) ×1
BAG COUNTER SPONGE SURGICOUNT (BAG) ×1 IMPLANT
BAG DECANTER FOR FLEXI CONT (MISCELLANEOUS) ×1 IMPLANT
BAG SPNG CNTER NS LX DISP (BAG) ×1
BENZOIN TINCTURE PRP APPL 2/3 (GAUZE/BANDAGES/DRESSINGS) ×1 IMPLANT
BLADE CLIPPER SURG (BLADE) IMPLANT
BLADE SURG 11 STRL SS (BLADE) IMPLANT
BUR CUTTER 7.0 ROUND (BURR) IMPLANT
BUR MATCHSTICK NEURO 3.0 LAGG (BURR) IMPLANT
CANISTER SUCT 3000ML PPV (MISCELLANEOUS) ×1 IMPLANT
DERMABOND ADVANCED .7 DNX12 (GAUZE/BANDAGES/DRESSINGS) ×1 IMPLANT
DRAPE LAPAROTOMY 100X72X124 (DRAPES) ×1 IMPLANT
DRAPE LAPAROTOMY T 102X78X121 (DRAPES) IMPLANT
DRAPE MICROSCOPE SLANT 54X150 (MISCELLANEOUS) ×1 IMPLANT
DRAPE SURG 17X23 STRL (DRAPES) ×2 IMPLANT
DRSG OPSITE POSTOP 4X6 (GAUZE/BANDAGES/DRESSINGS) IMPLANT
ELECT REM PT RETURN 9FT ADLT (ELECTROSURGICAL) ×1
ELECTRODE REM PT RTRN 9FT ADLT (ELECTROSURGICAL) ×1 IMPLANT
GAUZE 4X4 16PLY ~~LOC~~+RFID DBL (SPONGE) IMPLANT
GAUZE SPONGE 4X4 12PLY STRL (GAUZE/BANDAGES/DRESSINGS) ×1 IMPLANT
GLOVE BIO SURGEON STRL SZ 6.5 (GLOVE) ×1 IMPLANT
GLOVE BIOGEL PI IND STRL 6.5 (GLOVE) ×1 IMPLANT
GLOVE ECLIPSE 9.0 STRL (GLOVE) ×1 IMPLANT
GOWN STRL REUS W/ TWL LRG LVL3 (GOWN DISPOSABLE) IMPLANT
GOWN STRL REUS W/ TWL XL LVL3 (GOWN DISPOSABLE) IMPLANT
GOWN STRL REUS W/TWL 2XL LVL3 (GOWN DISPOSABLE) IMPLANT
GOWN STRL REUS W/TWL LRG LVL3 (GOWN DISPOSABLE)
GOWN STRL REUS W/TWL XL LVL3 (GOWN DISPOSABLE)
GRAFT DURAGEN MATRIX 1WX1L (Tissue) IMPLANT
HEMOSTAT POWDER KIT SURGIFOAM (HEMOSTASIS) IMPLANT
HEMOSTAT SURGICEL 2X14 (HEMOSTASIS) IMPLANT
KIT BASIN OR (CUSTOM PROCEDURE TRAY) ×1 IMPLANT
KIT TURNOVER KIT B (KITS) ×1 IMPLANT
NDL HYPO 25X1 1.5 SAFETY (NEEDLE) ×1 IMPLANT
NDL SPNL 20GX3.5 QUINCKE YW (NEEDLE) IMPLANT
NEEDLE HYPO 22GX1.5 SAFETY (NEEDLE) ×1 IMPLANT
NEEDLE HYPO 25X1 1.5 SAFETY (NEEDLE) IMPLANT
NEEDLE SPNL 20GX3.5 QUINCKE YW (NEEDLE) IMPLANT
NS IRRIG 1000ML POUR BTL (IV SOLUTION) ×1 IMPLANT
PACK LAMINECTOMY NEURO (CUSTOM PROCEDURE TRAY) ×1 IMPLANT
PATTIES SURGICAL .5 X.5 (GAUZE/BANDAGES/DRESSINGS) IMPLANT
PATTIES SURGICAL .5 X3 (DISPOSABLE) ×1 IMPLANT
SOL ELECTROSURG ANTI STICK (MISCELLANEOUS) ×1
SOLUTION ELECTROSURG ANTI STCK (MISCELLANEOUS) ×1 IMPLANT
SPONGE SURGIFOAM ABS GEL 100 (HEMOSTASIS) IMPLANT
SPONGE T-LAP 4X18 ~~LOC~~+RFID (SPONGE) IMPLANT
STAPLER VISISTAT 35W (STAPLE) ×1 IMPLANT
STRIP CLOSURE SKIN 1/2X4 (GAUZE/BANDAGES/DRESSINGS) ×1 IMPLANT
SUT BONE WAX W31G (SUTURE) IMPLANT
SUT ETHILON 4 0 PS 2 18 (SUTURE) ×1 IMPLANT
SUT NURALON 4 0 TR CR/8 (SUTURE) IMPLANT
SUT PROLENE 5 0 C 1 24 (SUTURE) IMPLANT
SUT PROLENE 5 0 C1 (SUTURE) IMPLANT
SUT PROLENE 6 0 BV (SUTURE) IMPLANT
SUT VIC AB 0 CT1 18XCR BRD8 (SUTURE) IMPLANT
SUT VIC AB 0 CT1 8-18 (SUTURE) ×2
SUT VIC AB 2-0 CT1 18 (SUTURE) ×1 IMPLANT
SUT VIC AB 3-0 SH 8-18 (SUTURE) IMPLANT
SUT VICRYL 4-0 PS2 18IN ABS (SUTURE) IMPLANT
TOWEL GREEN STERILE (TOWEL DISPOSABLE) ×1 IMPLANT
TOWEL GREEN STERILE FF (TOWEL DISPOSABLE) ×1 IMPLANT
TRAY FOLEY MTR SLVR 16FR STAT (SET/KITS/TRAYS/PACK) IMPLANT
TUBE CONNECTING 12X1/4 (SUCTIONS) IMPLANT
WATER STERILE IRR 1000ML POUR (IV SOLUTION) ×1 IMPLANT

## 2022-08-11 NOTE — Progress Notes (Addendum)
Patient report called to short stay area; patient for transport to OR for surgery this afternoon; patient has low O2 saturation upon reassessment; O2 via nasal cannula reapplied and O2 sat resolved to 98%.  Patient transported with oxygen via bed.  Patient states she does not use oxygen at home but uses Cpap at night; encouraged her to cough and deep breath.

## 2022-08-11 NOTE — Anesthesia Procedure Notes (Signed)
Arterial Line Insertion Start/End6/14/2024 3:20 PM, 08/11/2022 3:25 PM Performed by: De Nurse, CRNA  Preanesthetic checklist: patient identified, risks and benefits discussed and pre-op evaluation Lidocaine 1% used for infiltration Left, radial was placed Catheter size: 20 G Hand hygiene performed  and maximum sterile barriers used   Attempts: 1 Procedure performed without using ultrasound guided technique. Following insertion, dressing applied and Biopatch. Post procedure assessment: normal and unchanged  Patient tolerated the procedure well with no immediate complications.

## 2022-08-11 NOTE — H&P (Signed)
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 appears to understand.  She wishes to proceed with surgery.  Sherilyn Cooter A Charlesetta Milliron 08/11/2022, 8:19 AM

## 2022-08-11 NOTE — Plan of Care (Signed)
  Problem: Education: Goal: Knowledge of General Education information will improve Description: Including pain rating scale, medication(s)/side effects and non-pharmacologic comfort measures Outcome: Progressing   Problem: Health Behavior/Discharge Planning: Goal: Ability to manage health-related needs will improve Outcome: Progressing   Problem: Clinical Measurements: Goal: Ability to maintain clinical measurements within normal limits will improve Outcome: Progressing Goal: Will remain free from infection Outcome: Progressing Goal: Diagnostic test results will improve Outcome: Progressing Goal: Respiratory complications will improve Outcome: Progressing Goal: Cardiovascular complication will be avoided Outcome: Progressing   Problem: Activity: Goal: Risk for activity intolerance will decrease Outcome: Progressing   Problem: Nutrition: Goal: Adequate nutrition will be maintained Outcome: Progressing   Problem: Coping: Goal: Level of anxiety will decrease Outcome: Progressing   Problem: Elimination: Goal: Will not experience complications related to bowel motility Outcome: Progressing Goal: Will not experience complications related to urinary retention Outcome: Progressing   Problem: Pain Managment: Goal: General experience of comfort will improve Outcome: Progressing   Problem: Safety: Goal: Ability to remain free from injury will improve Outcome: Progressing   Problem: Skin Integrity: Goal: Risk for impaired skin integrity will decrease Outcome: Progressing   Problem: Education: Goal: Ability to describe self-care measures that may prevent or decrease complications (Diabetes Survival Skills Education) will improve Outcome: Progressing Goal: Individualized Educational Video(s) Outcome: Progressing   Problem: Coping: Goal: Ability to adjust to condition or change in health will improve Outcome: Progressing   Problem: Fluid Volume: Goal: Ability to  maintain a balanced intake and output will improve Outcome: Progressing   Problem: Health Behavior/Discharge Planning: Goal: Ability to identify and utilize available resources and services will improve Outcome: Progressing Goal: Ability to manage health-related needs will improve Outcome: Progressing   Problem: Metabolic: Goal: Ability to maintain appropriate glucose levels will improve Outcome: Progressing   Problem: Nutritional: Goal: Maintenance of adequate nutrition will improve Outcome: Progressing Goal: Progress toward achieving an optimal weight will improve Outcome: Progressing   Problem: Skin Integrity: Goal: Risk for impaired skin integrity will decrease Outcome: Progressing   Problem: Tissue Perfusion: Goal: Adequacy of tissue perfusion will improve Outcome: Progressing   Problem: Education: Goal: Ability to verbalize activity precautions or restrictions will improve Outcome: Progressing Goal: Knowledge of the prescribed therapeutic regimen will improve Outcome: Progressing Goal: Understanding of discharge needs will improve Outcome: Progressing   Problem: Activity: Goal: Ability to avoid complications of mobility impairment will improve Outcome: Progressing Goal: Ability to tolerate increased activity will improve Outcome: Progressing Goal: Will remain free from falls Outcome: Progressing   Problem: Bowel/Gastric: Goal: Gastrointestinal status for postoperative course will improve Outcome: Progressing   Problem: Clinical Measurements: Goal: Ability to maintain clinical measurements within normal limits will improve Outcome: Progressing Goal: Postoperative complications will be avoided or minimized Outcome: Progressing Goal: Diagnostic test results will improve Outcome: Progressing   Problem: Pain Management: Goal: Pain level will decrease Outcome: Progressing   Problem: Skin Integrity: Goal: Will show signs of wound healing Outcome:  Progressing   Problem: Health Behavior/Discharge Planning: Goal: Identification of resources available to assist in meeting health care needs will improve Outcome: Progressing   Problem: Bladder/Genitourinary: Goal: Urinary functional status for postoperative course will improve Outcome: Progressing   

## 2022-08-11 NOTE — Progress Notes (Signed)
CBG 107 

## 2022-08-11 NOTE — Brief Op Note (Signed)
08/11/2022  6:57 PM  PATIENT:  [name]  62 y.o. female  PRE-OPERATIVE DIAGNOSIS:  Thoracic seven spinal cord tethering  POST-OPERATIVE DIAGNOSIS:  Thoracic seven spinal cord tethering  PROCEDURE:  Procedure(s): Thoracic laminectomy with intradural exploration for spinal cord untethering/resection of arachnoid band (N/A)  SURGEON:  Surgeon(s) and Role:    Julio Sicks, MD - Primary  PHYSICIAN ASSISTANT:   ASSISTANTSMarland Mcalpine   ANESTHESIA:   general  EBL:  100 mL   BLOOD ADMINISTERED:none  DRAINS: none   LOCAL MEDICATIONS USED:  MARCAINE     SPECIMEN:  No Specimen  DISPOSITION OF SPECIMEN:  N/A  COUNTS:  YES  TOURNIQUET:  * No tourniquets in log *  DICTATION: .Dragon Dictation  PLAN OF CARE: Admit to inpatient   PATIENT DISPOSITION:  PACU - hemodynamically stable.   Delay start of Pharmacological VTE agent (>24hrs) due to surgical blood loss or risk of bleeding: yes

## 2022-08-11 NOTE — Op Note (Signed)
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  Microdissection  Surgeon:Donie Moulton A.Jacori Mulrooney, M.D.  Asst. Surgeon: Doran Durand, NP  Anesthesia: General  Indication: 62 year old female presents with bilateral upper extremity numbness paresthesias and pain right greater than left.  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  Patient is status post C5-6 and C6-7 anterior cervical discectomy and fusion.  She has a focal right paracentral disc herniation at C7-T1 with some mild cord compression.  MRI scanning of her thoracic spine demonstrates continuation of her syrinx down to the T7 level.  At this level the cord is obviously very constricted.  This appears most consistent with arachnoid webbing and tethering however the possibility of dural defect and spinal cord herniation is also possible.  The patient presents now for exploration of her thoracic intradural space with untethering of her spinal cord.  The syrinx will not be addressed during the surgery as I am hopeful that if the spinal cord is untethered the syrinx will resolve.  Operative note: After induction anesthesia, the patient was positioned prone onto bolsters and appropriately padded.  Thoracic region was prepped and draped sterilely.  Incision made from T6-T8.  Dissection performed bilaterally.  Retractor placed.  Fluoroscopy was used and the T6-T7 and T8 levels were confirmed.  Decompressive laminectomy was then performed using high-speed drill, Kerrison rongeurs and the Leksell rongeurs to remove the entire lamina of T6, T7 and the superior aspect lamina of T8.   Ligamentum flavum was elevated and resected.  Medial facetectomies were also performed for better exposure of the thecal sac and spinal cord.  Microscope was brought into the field and used for microdissection of the intradural space.  The dura was elevated and incised with an 11 blade.  CSF was allowed to drain.  The durotomy was then extended cephalad and caudad.  The arachnoid was very dense and opaque.  Caudally I was able to visualize normal spinal cord cranially I visualized the spinal cord which was somewhat expanded by syringomyelia.  Dense arachnoid webbing/banding markedly constricted the spinal cord at the T7 level.  This was carefully dissected free and incised with an 11 blade.  The arachnoid was stripped away from the underlying spinal cord and nerve roots.  There was no evidence of injury to the spinal cord or nerve roots during this process.  I visualized the lateral cord and confirmed that the dura was intact ventrally.  I saw no evidence of spinal cord herniation.  At this point I believe I have achieved good spinal cord untethering and the caliber and position of the cord return to a more normal anatomy.  The dura was then reapproximated using a running 5-0 Prolene suture.  DuraGen was placed over the dural repair as was Gelfoam.  Wound was then closed in layers with Vicryl sutures.  Steri-Strips and sterile dressing were applied no apparent complications.  Patient tolerated the procedure well and she returns to the recovery room postop.

## 2022-08-11 NOTE — Plan of Care (Signed)
Problem: Education: Goal: Knowledge of General Education information will improve Description: Including pain rating scale, medication(s)/side effects and non-pharmacologic comfort measures 08/11/2022 2335 by Hortencia Pilar, RN Outcome: Progressing 08/11/2022 2118 by Hortencia Pilar, RN Outcome: Progressing   Problem: Health Behavior/Discharge Planning: Goal: Ability to manage health-related needs will improve 08/11/2022 2335 by Hortencia Pilar, RN Outcome: Progressing 08/11/2022 2118 by Hortencia Pilar, RN Outcome: Progressing   Problem: Clinical Measurements: Goal: Ability to maintain clinical measurements within normal limits will improve 08/11/2022 2335 by Hortencia Pilar, RN Outcome: Progressing 08/11/2022 2118 by Hortencia Pilar, RN Outcome: Progressing Goal: Will remain free from infection 08/11/2022 2335 by Hortencia Pilar, RN Outcome: Progressing 08/11/2022 2118 by Hortencia Pilar, RN Outcome: Progressing Goal: Diagnostic test results will improve 08/11/2022 2335 by Hortencia Pilar, RN Outcome: Progressing 08/11/2022 2118 by Hortencia Pilar, RN Outcome: Progressing Goal: Respiratory complications will improve 08/11/2022 2335 by Hortencia Pilar, RN Outcome: Progressing 08/11/2022 2118 by Hortencia Pilar, RN Outcome: Progressing Goal: Cardiovascular complication will be avoided 08/11/2022 2335 by Hortencia Pilar, RN Outcome: Progressing 08/11/2022 2118 by Hortencia Pilar, RN Outcome: Progressing   Problem: Activity: Goal: Risk for activity intolerance will decrease 08/11/2022 2335 by Hortencia Pilar, RN Outcome: Progressing 08/11/2022 2118 by Hortencia Pilar, RN Outcome: Progressing   Problem: Nutrition: Goal: Adequate nutrition will be maintained 08/11/2022 2335 by Hortencia Pilar, RN Outcome: Progressing 08/11/2022 2118 by Hortencia Pilar, RN Outcome: Progressing   Problem: Coping: Goal: Level of anxiety will decrease 08/11/2022 2335 by Hortencia Pilar, RN Outcome: Progressing 08/11/2022 2118 by Hortencia Pilar, RN Outcome: Progressing   Problem: Elimination: Goal: Will not experience complications related to bowel motility 08/11/2022 2335 by Hortencia Pilar, RN Outcome: Progressing 08/11/2022 2118 by Hortencia Pilar, RN Outcome: Progressing Goal: Will not experience complications related to urinary retention 08/11/2022 2335 by Hortencia Pilar, RN Outcome: Progressing 08/11/2022 2118 by Hortencia Pilar, RN Outcome: Progressing   Problem: Pain Managment: Goal: General experience of comfort will improve 08/11/2022 2335 by Hortencia Pilar, RN Outcome: Progressing 08/11/2022 2118 by Hortencia Pilar, RN Outcome: Progressing   Problem: Safety: Goal: Ability to remain free from injury will improve 08/11/2022 2335 by Hortencia Pilar, RN Outcome: Progressing 08/11/2022 2118 by Hortencia Pilar, RN Outcome: Progressing   Problem: Skin Integrity: Goal: Risk for impaired skin integrity will decrease 08/11/2022 2335 by Hortencia Pilar, RN Outcome: Progressing 08/11/2022 2118 by Hortencia Pilar, RN Outcome: Progressing   Problem: Education: Goal: Ability to describe self-care measures that may prevent or decrease complications (Diabetes Survival Skills Education) will improve 08/11/2022 2335 by Hortencia Pilar, RN Outcome: Progressing 08/11/2022 2118 by Hortencia Pilar, RN Outcome: Progressing Goal: Individualized Educational Video(s) 08/11/2022 2335 by Hortencia Pilar, RN Outcome: Progressing 08/11/2022 2118 by Hortencia Pilar, RN Outcome: Progressing   Problem: Coping: Goal: Ability to adjust to condition or change in health will improve 08/11/2022 2335 by Hortencia Pilar, RN Outcome: Progressing 08/11/2022 2118 by Hortencia Pilar, RN Outcome: Progressing   Problem: Fluid Volume: Goal: Ability to maintain a balanced intake and output will improve 08/11/2022 2335 by Hortencia Pilar, RN Outcome:  Progressing 08/11/2022 2118 by Hortencia Pilar, RN Outcome: Progressing   Problem: Health Behavior/Discharge Planning: Goal: Ability to identify and utilize available resources and services will improve 08/11/2022 2335 by Hortencia Pilar, RN Outcome: Progressing 08/11/2022 2118 by Hortencia Pilar,  RN Outcome: Progressing Goal: Ability to manage health-related needs will improve 08/11/2022 2335 by Hortencia Pilar, RN Outcome: Progressing 08/11/2022 2118 by Hortencia Pilar, RN Outcome: Progressing   Problem: Metabolic: Goal: Ability to maintain appropriate glucose levels will improve 08/11/2022 2335 by Hortencia Pilar, RN Outcome: Progressing 08/11/2022 2118 by Hortencia Pilar, RN Outcome: Progressing   Problem: Nutritional: Goal: Maintenance of adequate nutrition will improve 08/11/2022 2335 by Hortencia Pilar, RN Outcome: Progressing 08/11/2022 2118 by Hortencia Pilar, RN Outcome: Progressing Goal: Progress toward achieving an optimal weight will improve 08/11/2022 2335 by Hortencia Pilar, RN Outcome: Progressing 08/11/2022 2118 by Hortencia Pilar, RN Outcome: Progressing   Problem: Skin Integrity: Goal: Risk for impaired skin integrity will decrease 08/11/2022 2335 by Hortencia Pilar, RN Outcome: Progressing 08/11/2022 2118 by Hortencia Pilar, RN Outcome: Progressing   Problem: Tissue Perfusion: Goal: Adequacy of tissue perfusion will improve 08/11/2022 2335 by Hortencia Pilar, RN Outcome: Progressing 08/11/2022 2118 by Hortencia Pilar, RN Outcome: Progressing   Problem: Education: Goal: Ability to verbalize activity precautions or restrictions will improve 08/11/2022 2335 by Hortencia Pilar, RN Outcome: Progressing 08/11/2022 2118 by Hortencia Pilar, RN Outcome: Progressing Goal: Knowledge of the prescribed therapeutic regimen will improve 08/11/2022 2335 by Hortencia Pilar, RN Outcome: Progressing 08/11/2022 2118 by Hortencia Pilar, RN Outcome:  Progressing Goal: Understanding of discharge needs will improve 08/11/2022 2335 by Hortencia Pilar, RN Outcome: Progressing 08/11/2022 2118 by Hortencia Pilar, RN Outcome: Progressing   Problem: Activity: Goal: Ability to avoid complications of mobility impairment will improve 08/11/2022 2335 by Hortencia Pilar, RN Outcome: Progressing 08/11/2022 2118 by Hortencia Pilar, RN Outcome: Progressing Goal: Ability to tolerate increased activity will improve 08/11/2022 2335 by Hortencia Pilar, RN Outcome: Progressing 08/11/2022 2118 by Hortencia Pilar, RN Outcome: Progressing Goal: Will remain free from falls 08/11/2022 2335 by Hortencia Pilar, RN Outcome: Progressing 08/11/2022 2118 by Hortencia Pilar, RN Outcome: Progressing   Problem: Bowel/Gastric: Goal: Gastrointestinal status for postoperative course will improve 08/11/2022 2335 by Hortencia Pilar, RN Outcome: Progressing 08/11/2022 2118 by Hortencia Pilar, RN Outcome: Progressing   Problem: Clinical Measurements: Goal: Ability to maintain clinical measurements within normal limits will improve 08/11/2022 2335 by Hortencia Pilar, RN Outcome: Progressing 08/11/2022 2118 by Hortencia Pilar, RN Outcome: Progressing Goal: Postoperative complications will be avoided or minimized 08/11/2022 2335 by Hortencia Pilar, RN Outcome: Progressing 08/11/2022 2118 by Hortencia Pilar, RN Outcome: Progressing Goal: Diagnostic test results will improve 08/11/2022 2335 by Hortencia Pilar, RN Outcome: Progressing 08/11/2022 2118 by Hortencia Pilar, RN Outcome: Progressing   Problem: Pain Management: Goal: Pain level will decrease 08/11/2022 2335 by Hortencia Pilar, RN Outcome: Progressing 08/11/2022 2118 by Hortencia Pilar, RN Outcome: Progressing   Problem: Skin Integrity: Goal: Will show signs of wound healing 08/11/2022 2335 by Hortencia Pilar, RN Outcome: Progressing 08/11/2022 2118 by Hortencia Pilar, RN Outcome:  Progressing   Problem: Health Behavior/Discharge Planning: Goal: Identification of resources available to assist in meeting health care needs will improve 08/11/2022 2335 by Hortencia Pilar, RN Outcome: Progressing 08/11/2022 2118 by Hortencia Pilar, RN Outcome: Progressing   Problem: Bladder/Genitourinary: Goal: Urinary functional status for postoperative course will improve 08/11/2022 2335 by Hortencia Pilar, RN Outcome: Progressing 08/11/2022 2118 by Hortencia Pilar, RN Outcome: Progressing

## 2022-08-11 NOTE — Anesthesia Procedure Notes (Signed)
Procedure Name: Intubation Date/Time: 08/11/2022 4:11 PM  Performed by: Alwyn Ren, CRNAPre-anesthesia Checklist: Patient identified, Emergency Drugs available, Suction available and Patient being monitored Patient Re-evaluated:Patient Re-evaluated prior to induction Oxygen Delivery Method: Circle system utilized Preoxygenation: Pre-oxygenation with 100% oxygen Induction Type: IV induction Ventilation: Mask ventilation without difficulty Laryngoscope Size: Glidescope and 3 Grade View: Grade I Tube type: Oral Tube size: 7.0 mm Number of attempts: 1 Airway Equipment and Method: Stylet and Oral airway Placement Confirmation: ETT inserted through vocal cords under direct vision, positive ETCO2 and breath sounds checked- equal and bilateral Secured at: 21 cm Tube secured with: Tape Dental Injury: Teeth and Oropharynx as per pre-operative assessment

## 2022-08-11 NOTE — Progress Notes (Signed)
Patient received from ER via stretcher; patient ambulated to bed from stretcher; patient is alert and oriented; oriented to room and unit routine; patient kept NPO and pre op check list begun.

## 2022-08-11 NOTE — ED Provider Notes (Signed)
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, right arm weakness and facial paresthesias, patient on gabapentin 300 mg TID  Dr Newell Coral prior neurosurgeon  MRI today concerning for T7 cord herniation, unusual appearance, pending now NSGY consultation - overnight EDP Dr Myra Gianotti has repaged neurosurgery service at 0700, awaiting callback  Physical Exam  BP 106/80 (BP Location: Left Arm)   Pulse 73   Temp 97.9 F (36.6 C) (Axillary)   Resp 19   Ht 5\' 4"  (1.626 m)   Wt 104.1 kg   LMP 09/10/2017 (LMP Unknown)   SpO2 94%   BMI 39.39 kg/m   Physical Exam  Procedures  Procedures  ED Course / MDM   Clinical Course as of 08/11/22 0844  Fri Aug 11, 2022  0208 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  Recommending C-spine with contrast as well as T-spine with and without contrast. [VB]  0259 S/w Patrici Ranks PA on-call for neurosurgery who recommends outpatient follow-up with neurosurgery for possible drainage.  Also recommends neurology consult. [VB]  0305 Dr Amada Jupiter recommends neurosurgery follow up, lyrica or gabapentin for pain control.  [VB]  0716 Re-consulted neurosurgery for formal consult regarding findings MRI [VB]  0731 I spoke to Hildred Priest NSGY PA who reports they will consult on patient this morning regarding MR findings [MT]    Clinical Course User Index [MT] Kaycen Whitworth, Kermit Balo, MD [VB] Mardene Sayer, MD   Medical Decision Making Amount and/or Complexity of Data Reviewed Labs: ordered. Radiology: ordered.  Risk Prescription drug management. Decision regarding hospitalization.   Admitted by neurosurgery service     Terald Sleeper, MD 08/11/22 [phone]

## 2022-08-11 NOTE — Discharge Instructions (Addendum)

## 2022-08-11 NOTE — Transfer of Care (Signed)
Immediate Anesthesia Transfer of Care Note  Patient: [name]  Procedure(s) Performed: Thoracic laminectomy with intradural exploration for spinal cord untethering/resection of arachnoid band (Back)  Patient Location: PACU  Anesthesia Type:General  Level of Consciousness: awake and alert   Airway & Oxygen Therapy: Patient Spontanous Breathing and Patient connected to face mask oxygen  Post-op Assessment: Report given to RN, Post -op Vital signs reviewed and stable, and Patient moving all extremities X 4  Post vital signs: Reviewed and stable  Last Vitals:  Vitals Value Taken Time  BP 155/122 08/11/22 1908  Temp    Pulse 74 08/11/22 1913  Resp 29 08/11/22 1913  SpO2 98 % 08/11/22 1913  Vitals shown include unvalidated device data.  Last Pain:  Vitals:   08/11/22 1515  TempSrc: Oral  PainSc: 8       Patients Stated Pain Goal: 2 (08/11/22 1515)  Complications: No notable events documented.

## 2022-08-11 NOTE — Progress Notes (Signed)
Postive MRSA, PCR result called by lab to the floor; called report to short stay RN.

## 2022-08-11 NOTE — Anesthesia Preprocedure Evaluation (Signed)
Anesthesia Evaluation  Patient identified by MRN, date of birth, ID band Patient awake    Reviewed: Allergy & Precautions, NPO status , Patient's Chart, lab work & pertinent test results  Airway Mallampati: II  TM Distance: >3 FB Neck ROM: Full    Dental  (+) Dental Advisory Given   Pulmonary asthma , sleep apnea , COPD, Current Smoker and Patient abstained from smoking.   breath sounds clear to auscultation       Cardiovascular hypertension, Pt. on medications +CHF   Rhythm:Regular Rate:Normal     Neuro/Psych  Neuromuscular disease    GI/Hepatic Neg liver ROS,GERD  ,,  Endo/Other  diabetesHypothyroidism    Renal/GU negative Renal ROS     Musculoskeletal  (+) Arthritis ,    Abdominal   Peds  Hematology negative hematology ROS (+)   Anesthesia Other Findings   Reproductive/Obstetrics                             Anesthesia Physical Anesthesia Plan  ASA: 3  Anesthesia Plan: General   Post-op Pain Management: Ofirmev IV (intra-op)*   Induction:   PONV Risk Score and Plan: 2 and Dexamethasone, Ondansetron and Treatment may vary due to age or medical condition  Airway Management Planned: Oral ETT  Additional Equipment: Arterial line  Intra-op Plan:   Post-operative Plan: Extubation in OR  Informed Consent: I have reviewed the patients History and Physical, chart, labs and discussed the procedure including the risks, benefits and alternatives for the proposed anesthesia with the patient or authorized representative who has indicated his/her understanding and acceptance.     Dental advisory given  Plan Discussed with: CRNA  Anesthesia Plan Comments:        Anesthesia Quick Evaluation

## 2022-08-12 ENCOUNTER — Encounter (HOSPITAL_COMMUNITY): Payer: Self-pay | Admitting: Neurosurgery

## 2022-08-12 LAB — GLUCOSE, CAPILLARY
Glucose-Capillary: 214 mg/dL — ABNORMAL HIGH (ref 70–99)
Glucose-Capillary: 350 mg/dL — ABNORMAL HIGH (ref 70–99)
Glucose-Capillary: 250 mg/dL — ABNORMAL HIGH (ref 70–99)
Glucose-Capillary: 262 mg/dL — ABNORMAL HIGH (ref 70–99)

## 2022-08-12 NOTE — Progress Notes (Signed)
Postop day 1.  Patient states that she feels significantly better.  No facial pain.  Upper extremity numbness and dysesthesias improved.  Lower extremity function also improved.  Patient has been up standing and ambulating.  She is happy with her progress.  She is awake and alert.  She is oriented and appropriate.  Her motor examination is improved and very near normal bilaterally.  Sensory examination is also improved.  Wound is clean and dry.  Chest and abdomen benign.

## 2022-08-12 NOTE — Evaluation (Signed)
Physical Therapy Evaluation Patient Details Name: [name] MRN: [id number] DOB: 06-04-1960 Today's Date: 08/12/2022  History of Present Illness  Pt is a 62 y.o. F who presents with difficulty with ambulation and R sided weakness/numbness/dyesthesias. 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 Significant PMH: prior C5-7 ACDF, asthma, DM.  Clinical Impression  PTA, pt lives with her cousin in a two level residence and is independent. Pt reports she is unsure if she can d/c back to this residence and has no other option for living; CSW/CM notified to follow up. Pt overall is mobilizing well. Ambulating 300 ft with no assistive device and negotiated stairs without physical assist or difficulty. Denies radicular pain. Of note, pt with O2 out upon entrance and satting 80%. RN notified and O2 placed back on 3L with rebound >/= 88%. Will continue to follow acutely to progress mobility as tolerated. Don't anticipate need for follow up PT.      Recommendations for follow up therapy are one component of a multi-disciplinary discharge planning process, led by the attending physician.  Recommendations may be updated based on patient status, additional functional criteria and insurance authorization.  Follow Up Recommendations       Assistance Recommended at Discharge PRN  Patient can return home with the following  Assist for transportation    Equipment Recommendations None recommended by PT  Recommendations for Other Services       Functional Status Assessment Patient has had a recent decline in their functional status and demonstrates the ability to make significant improvements in function in a reasonable and predictable amount of time.     Precautions / Restrictions Precautions Precautions: Back;Other (comment) Precaution Booklet Issued: Yes (comment) Precaution Comments:  Watch O2 Restrictions Weight Bearing Restrictions: No      Mobility  Bed Mobility Overal bed mobility: Modified Independent             General bed mobility comments: HOB elevated, cues for log roll technique    Transfers Overall transfer level: Independent Equipment used: None                    Ambulation/Gait Ambulation/Gait assistance: Supervision Gait Distance (Feet): 300 Feet Assistive device: None Gait Pattern/deviations: Step-through pattern, Decreased stride length, Wide base of support Gait velocity: decreased     General Gait Details: Slow and steady pace, cues for pursed lip breathing  Stairs Stairs: Yes Stairs assistance: Min guard Stair Management: Two rails, No rails Number of Stairs: 5 General stair comments: step by step pattern  Wheelchair Mobility    Modified Rankin (Stroke Patients Only)       Balance Overall balance assessment: Mild deficits observed, not formally tested                                           Pertinent Vitals/Pain Pain Assessment Pain Assessment: Faces Faces Pain Scale: Hurts a little bit Pain Location: back Pain Descriptors / Indicators: Operative site guarding Pain Intervention(s): Monitored during session    Home Living Family/patient expects to be discharged to:: Private residence Living Arrangements: Other relatives (cousin) Available Help at Discharge: Family Type of Home: House Home Access: Level entry     Alternate Level Stairs-Number of Steps:  (flight) Home Layout: Two level Home Equipment: BSC/3in1 Additional Comments: No 1/2  bath downstairs    Prior Function Prior Level of Function : Independent/Modified Independent             Mobility Comments: doesn't drive       Hand Dominance        Extremity/Trunk Assessment   Upper Extremity Assessment Upper Extremity Assessment: Defer to OT evaluation    Lower Extremity Assessment Lower Extremity  Assessment: Overall WFL for tasks assessed    Cervical / Trunk Assessment Cervical / Trunk Assessment: Back Surgery  Communication   Communication: No difficulties  Cognition Arousal/Alertness: Awake/alert Behavior During Therapy: WFL for tasks assessed/performed Overall Cognitive Status: Within Functional Limits for tasks assessed                                          General Comments      Exercises     Assessment/Plan    PT Assessment Patient needs continued PT services  PT Problem List Decreased activity tolerance;Decreased mobility;Decreased balance;Pain       PT Treatment Interventions DME instruction;Gait training;Stair training;Functional mobility training;Therapeutic activities;Therapeutic exercise;Balance training;Patient/family education    PT Goals (Current goals can be found in the Care Plan section)  Acute Rehab PT Goals Patient Stated Goal: find a place to live PT Goal Formulation: With patient Time For Goal Achievement: 08/26/22 Potential to Achieve Goals: Good    Frequency Min 5X/week     Co-evaluation               AM-PAC PT "6 Clicks" Mobility  Outcome Measure Help needed turning from your back to your side while in a flat bed without using bedrails?: None Help needed moving from lying on your back to sitting on the side of a flat bed without using bedrails?: None Help needed moving to and from a bed to a chair (including a wheelchair)?: None Help needed standing up from a chair using your arms (e.g., wheelchair or bedside chair)?: None Help needed to walk in hospital room?: A Little Help needed climbing 3-5 steps with a railing? : A Little 6 Click Score: 22    End of Session Equipment Utilized During Treatment: Gait belt;Oxygen Activity Tolerance: Patient tolerated treatment well Patient left: in bed;with call bell/phone within reach;with bed alarm set Nurse Communication: Mobility status PT Visit Diagnosis:  Unsteadiness on feet (R26.81);Pain Pain - part of body:  (back)    Time: 1610-9604 PT Time Calculation (min) (ACUTE ONLY): 20 min   Charges:   PT Evaluation $PT Eval Low Complexity: 1 Low          Lillia Pauls, PT, DPT Acute Rehabilitation Services Office [phone]   Norval Morton 08/12/2022, 4:35 PM

## 2022-08-12 NOTE — Progress Notes (Signed)
OT Cancellation Note  Patient Details Name: [name] MRN: [id number] DOB: [birth year]   Cancelled Treatment:    Reason Eval/Treat Not Completed: Fatigue/lethargy limiting ability to participate- pt lethargic and asking OT to return later. Will follow and see as able.   Barry Brunner, OT Acute Rehabilitation Services Office [phone]   Chancy Milroy 08/12/2022, 1:43 PM

## 2022-08-12 NOTE — Progress Notes (Signed)
Inpatient Rehab Admissions Coordinator:  Consult received. Await therapy evaluations and recommendations to help determine appropriate rehab venue.  Caylor Tallarico Graves Madden, MS, CCC-SLP Admissions Coordinator [phone]  

## 2022-08-13 LAB — GLUCOSE, CAPILLARY
Glucose-Capillary: 306 mg/dL — ABNORMAL HIGH (ref 70–99)
Glucose-Capillary: 314 mg/dL — ABNORMAL HIGH (ref 70–99)
Glucose-Capillary: 241 mg/dL — ABNORMAL HIGH (ref 70–99)
Glucose-Capillary: 267 mg/dL — ABNORMAL HIGH (ref 70–99)

## 2022-08-13 MED ORDER — HYDROCORTISONE 1 % EX CREA
1.0000 | TOPICAL_CREAM | Freq: Three times a day (TID) | CUTANEOUS | Status: DC | PRN
  Filled 2022-08-13: qty 28

## 2022-08-13 NOTE — Progress Notes (Signed)
Inpatient Rehab Admissions Coordinator:  Not PT is not recommending any PT follow up. TOC made aware. AC will sign off.   Wolfgang Phoenix, MS, CCC-SLP Admissions Coordinator [phone]

## 2022-08-13 NOTE — Evaluation (Signed)
Occupational Therapy Evaluation Patient Details Name: [name] MRN: [id number] DOB: [birth year] Today's Date: 08/13/2022   History of Present Illness Pt is a 62 y.o. F who presents with difficulty with ambulation and R sided weakness/numbness/dyesthesias. 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 T6, T7, T8 laminectomy with spinal cord untethering via resection of adherent arachnoid webbing performed 08/11/22. Significant PMH: prior C5-7 ACDF, asthma, DM.   Clinical Impression   Pt evaluated s/p above admission list. Pt reports independence with functional mobility without use of AD and receives assistance from daily aide for ADL/IADLs at baseline. Pt presents with decreased activity tolerance, diminished RUE sensation, limited RUE ROM, and RUE incoordination. Pt reports RUE deficits are baseline from a previous surgery about 1 year ago. Pt is currently min A for seated UB ADLs and max A for LB ADLs. Pt completed STS transfer and functional mobility without use of AD with generalized supervision for safety. Pt with VSS on RA with seated and static standing tasks, SpO2 desat to 79% with hallway level mobility, pt asymptomatic and quickly recovered with 3L O2 and seated rest. Pt educated on use of O2 for mobility at this time and how to monitor SpO2 with pt verbalizing understanding. Pt educated on back precautions with pt verbalizing understanding. Pt would benefit from continued acute OT services to maximize functional independence with AE and facilitate transition to pt's natural home environment. Pt with no follow up OT needs upon discharge.       Recommendations for follow up therapy are one component of a multi-disciplinary discharge planning process, led by the attending physician.  Recommendations may be updated based on patient status, additional functional criteria and  insurance authorization.   Assistance Recommended at Discharge Intermittent Supervision/Assistance  Patient can return home with the following A lot of help with bathing/dressing/bathroom;Assistance with cooking/housework;Assist for transportation;Help with stairs or ramp for entrance    Functional Status Assessment  Patient has had a recent decline in their functional status and demonstrates the ability to make significant improvements in function in a reasonable and predictable amount of time.  Equipment Recommendations  Tub/shower seat    Recommendations for Other Services       Precautions / Restrictions Precautions Precautions: Back;Other (comment) Precaution Booklet Issued: Yes (comment) Precaution Comments: Watch O2 Required Braces or Orthoses: Other Brace Other Brace: no brace required Restrictions Weight Bearing Restrictions: No      Mobility Bed Mobility               General bed mobility comments: Not assessed, pt seated EOB upon arriva;    Transfers Overall transfer level: Needs assistance Equipment used: None Transfers: Sit to/from Stand Sit to Stand: Supervision           General transfer comment: STS transfer from EOB with generalized supervision. Room/hallway level mobility with generalized supervision for safety.      Balance Overall balance assessment: Needs assistance Sitting-balance support: Feet supported, No upper extremity supported Sitting balance-Leahy Scale: Good Sitting balance - Comments: sitting EOB   Standing balance support: No upper extremity supported Standing balance-Leahy Scale: Good                             ADL either performed or assessed with clinical judgement   ADL Overall ADL's : Needs assistance/impaired Eating/Feeding: Set up;Sitting   Grooming: Wash/dry face;Oral care;Standing;Supervision/safety Grooming  Details (indicate cue type and reason): standing sinkside without BUE support; primarily  using non dominant LUE Upper Body Bathing: Sitting;Minimal assistance   Lower Body Bathing: Maximal assistance;Sit to/from stand   Upper Body Dressing : Minimal assistance;Sitting   Lower Body Dressing: Maximal assistance;Sit to/from stand Lower Body Dressing Details (indicate cue type and reason): Pt required max A to don bilat socks while seated EOB, reports needing assistance with LB ADLs at baseline. Pt education on potential LB AE. Toilet Transfer: Supervision/safety;Ambulation;Regular Toilet   Toileting- Architect and Hygiene: Supervision/safety;Sit to/from stand       Functional mobility during ADLs: Supervision/safety General ADL Comments: limited secondary to baseline RUE function, needs assist at baseline.     Vision Baseline Vision/History: 0 No visual deficits Ability to See in Adequate Light: 0 Adequate Vision Assessment?: No apparent visual deficits     Perception Perception Perception Tested?: No   Praxis Praxis Praxis tested?: Not tested    Pertinent Vitals/Pain Pain Assessment Pain Assessment: No/denies pain Pain Intervention(s): Monitored during session     Hand Dominance Right   Extremity/Trunk Assessment Upper Extremity Assessment Upper Extremity Assessment: LUE deficits/detail;RUE deficits/detail RUE Deficits / Details: incoordination noted with reciprocal finger opposition, shoulder flexion limited to approx 90 degrees. Pt reports this is baseline from about 1 year ago after a previous surgery. Pt reports difficulty managing ADL/IADL tasks 2/2 RUE deficits. RUE Sensation: decreased light touch (from elbow to digits) RUE Coordination: decreased gross motor;decreased fine motor LUE Deficits / Details: WFL LUE Sensation: WNL LUE Coordination: WNL   Lower Extremity Assessment Lower Extremity Assessment: Defer to PT evaluation   Cervical / Trunk Assessment Cervical / Trunk Assessment: Back Surgery   Communication  Communication Communication: No difficulties   Cognition Arousal/Alertness: Awake/alert Behavior During Therapy: WFL for tasks assessed/performed Overall Cognitive Status: Within Functional Limits for tasks assessed                                 General Comments: A+O x4, pt able to recall 2/3 "BLT" back precautions. Pt receptive to education.     General Comments  On RA with VSS during seated and static standing, SpO2 desat to 79% on RA with mobility. Pt quickly recovered with seated rest break and 3L [place]. Pt educated on use of O2 for mobility    Exercises     Shoulder Instructions      Home Living Family/patient expects to be discharged to:: Private residence Living Arrangements: Other relatives (cousin) Available Help at Discharge: Family;Personal care attendant Type of Home: House Home Access: Level entry     Home Layout: Two level Alternate Level Stairs-Number of Steps: flight Alternate Level Stairs-Rails:  (broken rail) Bathroom Shower/Tub: Tub/shower unit         Home Equipment: BSC/3in1   Additional Comments: No 1/2 bath downstairs; Pt reports poor living conditions at cousin's house, she is hopeful to stay with sister.      Prior Functioning/Environment Prior Level of Function : Needs assist             Mobility Comments: indepenent with mobility without use of AD ADLs Comments: Requires assist for ADL/IADLs at baseline from aide who comes 7 days/week. Pt does not drive.        OT Problem List: Decreased strength;Decreased range of motion;Decreased activity tolerance;Impaired balance (sitting and/or standing);Cardiopulmonary status limiting activity      OT Treatment/Interventions: Self-care/ADL training;Energy conservation;DME and/or AE  instruction;Patient/family education;Balance training    OT Goals(Current goals can be found in the care plan section) Acute Rehab OT Goals Patient Stated Goal: to go home OT Goal Formulation: With  patient Time For Goal Achievement: 08/27/22 Potential to Achieve Goals: Good ADL Goals Pt Will Perform Lower Body Dressing: with adaptive equipment;sit to/from stand;with supervision Additional ADL Goal #1: Pt will independently complete RUE fine motor HEP to increase independence with ADLs.  OT Frequency: Min 2X/week    Co-evaluation              AM-PAC OT "6 Clicks" Daily Activity     Outcome Measure Help from another person eating meals?: A Little Help from another person taking care of personal grooming?: A Little Help from another person toileting, which includes using toliet, bedpan, or urinal?: A Little Help from another person bathing (including washing, rinsing, drying)?: A Lot Help from another person to put on and taking off regular upper body clothing?: A Little Help from another person to put on and taking off regular lower body clothing?: A Lot 6 Click Score: 16   End of Session Equipment Utilized During Treatment: Oxygen Nurse Communication: Mobility status  Activity Tolerance: Patient tolerated treatment well Patient left: in bed;with call bell/phone within reach  OT Visit Diagnosis: Muscle weakness (generalized) (M62.81);Other (comment) (back surgery)                Time: 2440-1027 OT Time Calculation (min): 23 min Charges:  OT General Charges $OT Visit: 1 Visit OT Evaluation $OT Eval Moderate Complexity: 1 Mod OT Treatments $Self Care/Home Management : 8-22 mins  Sherley Bounds, OTS Acute Rehabilitation Services Office [phone] Secure Chat Communication Preferred   Sherley Bounds 08/13/2022, 12:50 PM

## 2022-08-13 NOTE — Progress Notes (Signed)
Postop day 2.  Incisional pain well-controlled.  Patient does feel some tightness around her chest.  Preoperative numbness and dysesthesias markedly improved although still has some right shoulder discomfort and some right upper extremity numbness.  She mobilized reasonably well yesterday.  She is awake and alert.  She is oriented and reasonably appropriate.  Motor examination 5/5 bilaterally.  Sensory examination with some patchy sensory loss in her right upper extremity but overall sensory exam significantly proved from preop.  Her dressing is clean and dry..  Overall progressing well.  Continue efforts at mobilization.  Will need social work consult for discharge planning.

## 2022-08-13 NOTE — Anesthesia Postprocedure Evaluation (Signed)
Anesthesia Post Note  Patient: [name]  Procedure(s) Performed: Thoracic laminectomy with intradural exploration for spinal cord untethering/resection of arachnoid band (Back)     Patient location during evaluation: PACU Anesthesia Type: General Level of consciousness: awake and alert Pain management: pain level controlled Vital Signs Assessment: post-procedure vital signs reviewed and stable Respiratory status: spontaneous breathing, nonlabored ventilation, respiratory function stable and patient connected to nasal cannula oxygen Cardiovascular status: blood pressure returned to baseline and stable Postop Assessment: no apparent nausea or vomiting Anesthetic complications: no   No notable events documented.  Last Vitals:  Vitals:   08/13/22 0715 08/13/22 0745  BP:  132/63  Pulse:  77  Resp:  18  Temp:  36.4 C  SpO2: 97% 95%    Last Pain:  Vitals:   08/13/22 0745  TempSrc: Oral  PainSc:                  Myleka Moncure S

## 2022-08-14 LAB — GLUCOSE, CAPILLARY
Glucose-Capillary: 293 mg/dL — ABNORMAL HIGH (ref 70–99)
Glucose-Capillary: 447 mg/dL — ABNORMAL HIGH (ref 70–99)
Glucose-Capillary: 353 mg/dL — ABNORMAL HIGH (ref 70–99)
Glucose-Capillary: 265 mg/dL — ABNORMAL HIGH (ref 70–99)

## 2022-08-14 LAB — GLUCOSE, RANDOM: Glucose, Bld: 461 mg/dL — ABNORMAL HIGH (ref 70–99)

## 2022-08-14 MED ORDER — INSULIN ASPART 100 UNIT/ML IJ SOLN
20.0000 [IU] | Freq: Once | INTRAMUSCULAR | Status: AC
  Administered 2022-08-14: 20 [IU] via SUBCUTANEOUS

## 2022-08-14 MED ORDER — LIDOCAINE HCL (PF) 1 % IJ SOLN
5.0000 mL | Freq: Once | INTRAMUSCULAR | Status: AC
  Administered 2022-08-14: 5 mL via INTRADERMAL
  Filled 2022-08-14: qty 5

## 2022-08-14 NOTE — Progress Notes (Signed)
   08/14/22 2258  BiPAP/CPAP/SIPAP  BiPAP/CPAP/SIPAP Pt Type Adult  BiPAP/CPAP/SIPAP Resmed  Mask Type Nasal mask  Mask Size Small  Respiratory Rate 18 breaths/min  EPAP 10 cmH2O  Patient Home Equipment No  Auto Titrate No  BiPAP/CPAP /SiPAP Vitals  Pulse Rate 80  Resp 18  SpO2 94 %  Bilateral Breath Sounds Clear;Diminished  MEWS Score/Color  MEWS Score 0  MEWS Score Color [place] Si

## 2022-08-14 NOTE — Progress Notes (Signed)
Physical Therapy Treatment Patient Details Name: [name] MRN: [id number] DOB: 02-17-1961 Today's Date: 08/14/2022   History of Present Illness Pt is a 62 y.o. F who presents with difficulty with ambulation and R sided weakness/numbness/dyesthesias. 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 T6, T7, T8 laminectomy with spinal cord untethering via resection of adherent arachnoid webbing performed 08/11/22. Significant PMH: prior C5-7 ACDF, asthma, DM.    PT Comments    Today pt with mod I to supervision for all functional mobility, amb 250' with no AD. O2 needs with activities assessed today. Pt at 94% O2 sat on RA at rest, desats to 80% during amb with one standing rest break, supplemental 2L O2 provided and rebound to 94% within 1-2 minutes. Pt educated on need for additional O2 at home for activity. Pt will continue to benefit from skilled PT during their acute admission to facilitate improvements in functional endurance, gait training, and stair training.   Recommendations for follow up therapy are one component of a multi-disciplinary discharge planning process, led by the attending physician.  Recommendations may be updated based on patient status, additional functional criteria and insurance authorization.  Follow Up Recommendations       Assistance Recommended at Discharge PRN  Patient can return home with the following Assist for transportation   Equipment Recommendations  None recommended by PT    Recommendations for Other Services       Precautions / Restrictions Precautions Precautions: Back;Other (comment) Precaution Booklet Issued: Yes (comment) Precaution Comments: Watch O2 Required Braces or Orthoses: Other Brace Other Brace: no brace required Restrictions Weight Bearing Restrictions: No     Mobility  Bed Mobility Overal bed mobility:  Modified Independent             General bed mobility comments:  (Performs log roll with decreased time, pushes to upright positioning through R elbow to achieve seated EOB)    Transfers Overall transfer level: Modified independent Equipment used: None Transfers: Sit to/from Stand Sit to Stand: Supervision           General transfer comment: Adequate push off to achieve stand    Ambulation/Gait Ambulation/Gait assistance: Supervision Gait Distance (Feet): 250 Feet Assistive device: None Gait Pattern/deviations: Step-through pattern, Decreased stride length, Wide base of support Gait velocity: decreased Gait velocity interpretation: 1.31 - 2.62 ft/sec, indicative of limited community ambulator   General Gait Details: Pt amb on RA initially, requires one standing rest break to catch breath and supplement with 2L O2, recovers with 1-2 min standing rest.   Stairs             Wheelchair Mobility    Modified Rankin (Stroke Patients Only)       Balance Overall balance assessment: Mild deficits observed, not formally tested Sitting-balance support: Feet supported, No upper extremity supported Sitting balance-Leahy Scale: Good     Standing balance support: No upper extremity supported Standing balance-Leahy Scale: Good                              Cognition Arousal/Alertness: Awake/alert Behavior During Therapy: WFL for tasks assessed/performed Overall Cognitive Status: Within Functional Limits for tasks assessed                                 General Comments: Pt  recites back precautions, slightly impulsive/impatient with mobility        Exercises      General Comments General comments (skin integrity, edema, etc.): Pt at 94% O2 at rest on RA, donned [place] for ambulation prep. Desats to 80 during amb, supplemented with 2L O2 and rebounds to 94 in 1-2 min.      Pertinent Vitals/Pain Pain Assessment Pain Assessment: No/denies  pain    Home Living                          Prior Function            PT Goals (current goals can now be found in the care plan section) Acute Rehab PT Goals Patient Stated Goal: ready to get out of here PT Goal Formulation: With patient Time For Goal Achievement: 08/26/22 Potential to Achieve Goals: Good Progress towards PT goals: Progressing toward goals    Frequency    Min 5X/week      PT Plan Current plan remains appropriate    Co-evaluation              AM-PAC PT "6 Clicks" Mobility   Outcome Measure  Help needed turning from your back to your side while in a flat bed without using bedrails?: None Help needed moving from lying on your back to sitting on the side of a flat bed without using bedrails?: None Help needed moving to and from a bed to a chair (including a wheelchair)?: None Help needed standing up from a chair using your arms (e.g., wheelchair or bedside chair)?: None Help needed to walk in hospital room?: A Little Help needed climbing 3-5 steps with a railing? : A Little 6 Click Score: 22    End of Session Equipment Utilized During Treatment: Gait belt;Oxygen Activity Tolerance: Patient tolerated treatment well Patient left: in bed;with call bell/phone within reach;with bed alarm set Nurse Communication: Mobility status PT Visit Diagnosis: Unsteadiness on feet (R26.81);Pain     Time: 1545-1600 PT Time Calculation (min) (ACUTE ONLY): 15 min  Charges:  $Therapeutic Activity: 8-22 mins                     Hendricks Milo, SPT  Acute Rehabilitation Services    Hendricks Milo 08/14/2022, 4:45 PM

## 2022-08-14 NOTE — TOC Progression Note (Signed)
Transition of Care [facility]) - Progression Note    Patient Details  Name: [name] MRN: [id number] Date of Birth: 1960/06/01  Transition [facility]) CM/SW Contact  Leone Haven, RN Phone Number: 08/14/2022, 4:17 PM  Clinical Narrative:    NCM notified by physical therapy she will need home oxygen, NCM notified Dr. Jordan Likes and Doran Durand for oxygen orders.  NCM made referral to [facility] with [employer], they await oxygen orders.        Expected Discharge Plan and Services                                               Social Determinants of Health (SDOH) Interventions SDOH Screenings   Food Insecurity: Food Insecurity Present (08/11/2022)  Housing: High Risk (08/11/2022)  Transportation Needs: No Transportation Needs (08/11/2022)  Utilities: Not At Risk (08/11/2022)  Tobacco Use: High Risk (08/12/2022)    Readmission Risk Interventions     No data to display

## 2022-08-14 NOTE — Progress Notes (Signed)
Patient's fingerstick/CBG noted 447. Writer notified Julio Sicks, MD - awaiting orders. Writer ordered STAT glucose level, per order.

## 2022-08-14 NOTE — Progress Notes (Signed)
   Providing Compassionate, Quality Care - Together   Subjective: Patient reports drainage from her surgical wound.  Objective: Vital signs in last 24 hours: Temp:  [97.9 F (36.6 C)-98.5 F (36.9 C)] 98 F (36.7 C) (06/17 0720) Pulse Rate:  [68-78] 68 (06/17 0814) Resp:  [18-20] 18 (06/17 0814) BP: (125-172)/(63-84) 172/84 (06/17 0720) SpO2:  [90 %-97 %] 94 % (06/17 0814)  Intake/Output from previous day: No intake/output data recorded. Intake/Output this shift: Total I/O In: 3 [I.V.:3] Out: -   Alert and oriented x 4 PERRLA CN II-XII grossly intact MAE, Strength and sensation intact Incision is covered with ABD over a Honeycomb dressing and Steri Strips; Dressing is saturated with serosanguinous drainage  Lab Results: No results for input(s): "WBC", "HGB", "HCT", "PLT" in the last 72 hours. BMET No results for input(s): "NA", "K", "CL", "CO2", "GLUCOSE", "BUN", "CREATININE", "CALCIUM" in the last 72 hours.  Studies/Results: No results found.  Assessment/Plan: Patient underwent a T6, T7, and T8 laminectomy with spinal cord untethering by Dr. Jordan Likes on 08/11/2022. She developed drainage over night and surgical wound was over sewn this morning with 3-0 Nylon running suture.   LOS: 3 days   -Monitor surgical wound for further drainage. Can d/c home tomorrow if no further drainage.   Val Eagle, DNP, AGNP-C Nurse Practitioner  [facility] [address], [address], [place], [place] [address] P: [phone]    F: [phone]  08/14/2022, 11:03 AM

## 2022-08-14 NOTE — Care Management Important Message (Signed)
Important Message  Patient Details  Name: [name] MRN: [id number] Date of Birth: 08-31-1960   Medicare Important Message Given:  Yes     Rishabh Rinkenberger Stefan Church 08/14/2022, 3:52 PM

## 2022-08-14 NOTE — Progress Notes (Signed)
SATURATION QUALIFICATIONS: (This note is used to comply with regulatory documentation for home oxygen)  Patient Saturations on Room Air at Rest = 94%  Patient Saturations on Room Air while Ambulating = 80%  Patient Saturations on 2 Liters of oxygen while Ambulating = 94%  Please briefly explain why patient needs home oxygen: To maintain oxygen saturations > 88% when ambulating.  Lillia Pauls, PT, DPT Acute Rehabilitation Services Office [phone]

## 2022-08-15 LAB — GLUCOSE, CAPILLARY
Glucose-Capillary: 223 mg/dL — ABNORMAL HIGH (ref 70–99)
Glucose-Capillary: 277 mg/dL — ABNORMAL HIGH (ref 70–99)
Glucose-Capillary: 384 mg/dL — ABNORMAL HIGH (ref 70–99)
Glucose-Capillary: 269 mg/dL — ABNORMAL HIGH (ref 70–99)
Glucose-Capillary: 414 mg/dL — ABNORMAL HIGH (ref 70–99)
Glucose-Capillary: 425 mg/dL — ABNORMAL HIGH (ref 70–99)

## 2022-08-15 NOTE — Progress Notes (Signed)
Overall progressing reasonably well.  Pain well-controlled.  Preoperative symptoms remain significantly improved.  Standing ambulating and voiding.  No further wound issues.  Patient would like 1 more day in the hospital before going home tomorrow.  She is afebrile.  Vital signs are stable.  Her wound is healing well.  Suture line dry.  Chest and abdomen benign.  Motor examination intact.  Progressing well following spinal cord untethering.  Continue efforts at mobilization.  Plan plan for discharge tomorrow.

## 2022-08-15 NOTE — Progress Notes (Signed)
   08/15/22 2240  BiPAP/CPAP/SIPAP  BiPAP/CPAP/SIPAP Pt Type Adult  BiPAP/CPAP/SIPAP Resmed  Mask Type Nasal mask  Mask Size Small  Respiratory Rate 18 breaths/min  EPAP 10 cmH2O  Patient Home Equipment No  Auto Titrate No  BiPAP/CPAP /SiPAP Vitals  Pulse Rate 78  Resp 18  SpO2 95 %  Bilateral Breath Sounds Clear  MEWS Score/Color  MEWS Score 0  MEWS Score Color Chilton Si

## 2022-08-15 NOTE — Plan of Care (Signed)

## 2022-08-15 NOTE — Progress Notes (Signed)
Occupational Therapy Treatment Patient Details Name: [name] MRN: [id number] DOB: 01-26-1961 Today's Date: 08/15/2022   History of present illness Pt is a 62 y.o. F who presents with difficulty with ambulation and R sided weakness/numbness/dyesthesias. 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 T6, T7, T8 laminectomy with spinal cord untethering via resection of adherent arachnoid webbing performed 08/11/22. Significant PMH: prior C5-7 ACDF, asthma, DM.   OT comments  Pt progressing well towards therapy goals. Pt with complaints of fatigue upon arrival, however pt agreeable to EOB activity. Pt educated on energy conservation strategies including seated showers at shower chair to maximize independence with ADLs upon discharge with pt verbalizing understanding. Pt educated on importance of wearing O2 with OOB activity, monitoring SpO2 and safety managing O2 within the house, pt verbalized understanding. Pt educated and demonstrated use of sock aid and reacher for donning/doffing bilat socks at EOB with min A progressing to supervision with min cues. Pt educated on use of reacher for pants/garments and long-handled sponge for bathing with pt verbalizing understanding. Pt would continue to benefit from acute OT services to maximize functional independence and facilitate transition to pt's natural home environment. Pt does not require follow up OT services upon discharge.    Recommendations for follow up therapy are one component of a multi-disciplinary discharge planning process, led by the attending physician.  Recommendations may be updated based on patient status, additional functional criteria and insurance authorization.    Assistance Recommended at Discharge Intermittent Supervision/Assistance  Patient can return home with the following  A little help with  bathing/dressing/bathroom;Assistance with cooking/housework;Assist for transportation;Help with stairs or ramp for entrance   Equipment Recommendations  Tub/shower seat    Recommendations for Other Services      Precautions / Restrictions Precautions Precautions: Back;Other (comment) Precaution Booklet Issued: Yes (comment) Precaution Comments: Watch O2 Required Braces or Orthoses: Other Brace Other Brace: no brace required Restrictions Weight Bearing Restrictions: No       Mobility Bed Mobility Overal bed mobility: Modified Independent             General bed mobility comments: HOB elevated, use of log roll    Transfers                   General transfer comment: transfers not assessed this session     Balance Overall balance assessment: Needs assistance Sitting-balance support: No upper extremity supported, Feet supported Sitting balance-Leahy Scale: Good Sitting balance - Comments: sitting EOB                                   ADL either performed or assessed with clinical judgement   ADL Overall ADL's : Needs assistance/impaired                     Lower Body Dressing: Sit to/from stand;Supervision/safety;Cueing for sequencing Lower Body Dressing Details (indicate cue type and reason): Pt educated and demonstrated use of sock aid and reacher for donning/doffing bilat socks at EOB, pt required min A for first trial progressing to supervision with min cues for sequencing. Pt educated on use of reacher for donning/doffing pants/undergarments, pt verbalized understanding.               General ADL Comments: limited secondary to fatigue, generalized weakness, baseline RUE deficits    Extremity/Trunk  Assessment Upper Extremity Assessment RUE Deficits / Details: RUE incoordination and limited AROM, baseline from surgery 1 year ago LUE Deficits / Details: WFL   Lower Extremity Assessment Lower Extremity Assessment: Defer to PT  evaluation        Vision   Vision Assessment?: No apparent visual deficits   Perception Perception Perception: Not tested   Praxis Praxis Praxis: Not tested    Cognition Arousal/Alertness: Awake/alert Behavior During Therapy: WFL for tasks assessed/performed Overall Cognitive Status: Within Functional Limits for tasks assessed                                 General Comments: Pt able to independently recall 3/3 BLT back precautions        Exercises      Shoulder Instructions       General Comments SpO2 at 94% on RA after seated activity    Pertinent Vitals/ Pain       Pain Assessment Pain Assessment: Faces Faces Pain Scale: Hurts a little bit Pain Location: back and R shoulder Pain Descriptors / Indicators: Operative site guarding Pain Intervention(s): Monitored during session, Limited activity within patient's tolerance  Home Living                                          Prior Functioning/Environment              Frequency  Min 2X/week        Progress Toward Goals  OT Goals(current goals can now be found in the care plan section)  Progress towards OT goals: Progressing toward goals  Acute Rehab OT Goals Patient Stated Goal: to get better OT Goal Formulation: With patient Time For Goal Achievement: 08/27/22 Potential to Achieve Goals: Good ADL Goals Pt Will Perform Lower Body Dressing: with adaptive equipment;sit to/from stand;with supervision Additional ADL Goal #1: Pt will independently complete RUE fine motor HEP to increase independence with ADLs.  Plan Discharge plan remains appropriate;Frequency remains appropriate    Co-evaluation                 AM-PAC OT "6 Clicks" Daily Activity     Outcome Measure   Help from another person eating meals?: A Little Help from another person taking care of personal grooming?: A Little Help from another person toileting, which includes using toliet, bedpan,  or urinal?: A Little Help from another person bathing (including washing, rinsing, drying)?: A Little Help from another person to put on and taking off regular upper body clothing?: A Little (with AE) Help from another person to put on and taking off regular lower body clothing?: A Little (with AE) 6 Click Score: 18    End of Session Equipment Utilized During Treatment: Other (comment) (sock aid and reacher)  OT Visit Diagnosis: Muscle weakness (generalized) (M62.81);Other (comment)   Activity Tolerance Patient tolerated treatment well   Patient Left in bed;with call bell/phone within reach   Nurse Communication Mobility status        Time: 1550-1606 OT Time Calculation (min): 16 min  Charges: OT General Charges $OT Visit: 1 Visit OT Treatments $Self Care/Home Management : 8-22 mins  Sherley Bounds, OTS Acute Rehabilitation Services Office [phone] Secure Chat Communication Preferred   Sherley Bounds 08/15/2022, 4:30 PM

## 2022-08-15 NOTE — TOC Progression Note (Addendum)
Transition of Care [facility]) - Progression Note    Patient Details  Name: [name] MRN: [id number] Date of Birth: [birth year]  Transition [facility]) CM/SW Contact  Baldemar Lenis, [place] Phone Number: 08/15/2022, 10:08 AM  Clinical Narrative:   CSW contacted by [facility] and bedside RN that patient asking for assistance with housing. CSW met with patient to provide resources on boarding homes or shelters at discharge. Patient asking for SNF placement, and CSW explained that patient is doing too well for that to be covered. Patient asking about using her Medicaid, but CSW explained she would have to turn over her check and she did not want to do that. Patient said she cannot go back to where she has been staying while she heals from surgery because she does not feel that it is a clean environment. CSW informed patient that she would have to find another place to stay, and friend at bedside said she would assist the patient with calling some places and trying to locate a discharge plan for patient.     Expected Discharge Plan: Home/Self Care Barriers to Discharge: Continued Medical Work up  Expected Discharge Plan and Services                                               Social Determinants of Health (SDOH) Interventions SDOH Screenings   Food Insecurity: Food Insecurity Present (08/11/2022)  Housing: High Risk (08/11/2022)  Transportation Needs: No Transportation Needs (08/11/2022)  Utilities: Not At Risk (08/11/2022)  Tobacco Use: High Risk (08/12/2022)    Readmission Risk Interventions     No data to display

## 2022-08-15 NOTE — Progress Notes (Addendum)
Physical Therapy Treatment Patient Details Name: [name] MRN: [id number] DOB: [birth year] Today's Date: 08/15/2022   History of Present Illness Pt is a 62 y.o. F who presents with difficulty with ambulation and R sided weakness/numbness/dyesthesias. 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 T6, T7, T8 laminectomy with spinal cord untethering via resection of adherent arachnoid webbing performed 08/11/22. Significant PMH: prior C5-7 ACDF, asthma, DM.    PT Comments    Today's session focused on gait training, stairs, and assessing O2 needs during activity. At rest on RA pt at 92%+ O2 sats, 2L [place] donned for ambulation. Pt with supervision for amb 100' with no AD, sat at 90% with one standing break, immediately rebounds to 92%+. Post ambulation seated EOB O2 sats 94%+. Pt will continue to benefit from skilled PT during their acute admission to facilitate improvements in gait, stairs, and functional endurance.   Recommendations for follow up therapy are one component of a multi-disciplinary discharge planning process, led by the attending physician.  Recommendations may be updated based on patient status, additional functional criteria and insurance authorization.  Follow Up Recommendations       Assistance Recommended at Discharge PRN  Patient can return home with the following Assist for transportation   Equipment Recommendations  None recommended by PT    Recommendations for Other Services       Precautions / Restrictions Precautions Precautions: Back;Other (comment) Precaution Booklet Issued: Yes (comment) Precaution Comments: Watch O2 Required Braces or Orthoses: Other Brace Other Brace: no brace required Restrictions Weight Bearing Restrictions: No     Mobility  Bed Mobility Overal bed mobility: Modified Independent             General bed  mobility comments: HOB elevated, use of log roll    Transfers Overall transfer level: Needs assistance Equipment used: None Transfers: Sit to/from Stand Sit to Stand: Supervision                Ambulation/Gait Ambulation/Gait assistance: Supervision Gait Distance (Feet): 100 Feet Assistive device: None Gait Pattern/deviations: Step-through pattern, Decreased stride length, Wide base of support Gait velocity: decreased Gait velocity interpretation: 1.31 - 2.62 ft/sec, indicative of limited community ambulator   General Gait Details: O2 sats 94%+ on RA, 2L [place] O2 for ambulation, 90% and one standing rest break, rebounds to 94%+ in seated post amb.   Stairs Stairs: Yes Stairs assistance: Min guard Stair Management: Two rails, No rails Number of Stairs: 5 General stair comments: step-to pattern for ascent and descent   Wheelchair Mobility    Modified Rankin (Stroke Patients Only)       Balance Overall balance assessment: Needs assistance Sitting-balance support: No upper extremity supported, Feet supported Sitting balance-Leahy Scale: Good     Standing balance support: No upper extremity supported Standing balance-Leahy Scale: Good                              Cognition Arousal/Alertness: Awake/alert Behavior During Therapy: WFL for tasks assessed/performed Overall Cognitive Status: Within Functional Limits for tasks assessed                                          Exercises      General Comments General comments (skin integrity, edema, etc.):  SpO2 at 94% on RA after seated activity      Pertinent Vitals/Pain Pain Assessment Pain Assessment: No/denies pain    Home Living                          Prior Function            PT Goals (current goals can now be found in the care plan section) Acute Rehab PT Goals Patient Stated Goal: i need to sleep PT Goal Formulation: With patient Time For Goal  Achievement: 08/26/22 Potential to Achieve Goals: Good Progress towards PT goals: Progressing toward goals    Frequency    Min 5X/week      PT Plan Current plan remains appropriate    Co-evaluation              AM-PAC PT "6 Clicks" Mobility   Outcome Measure  Help needed turning from your back to your side while in a flat bed without using bedrails?: None Help needed moving from lying on your back to sitting on the side of a flat bed without using bedrails?: None Help needed moving to and from a bed to a chair (including a wheelchair)?: None Help needed standing up from a chair using your arms (e.g., wheelchair or bedside chair)?: None Help needed to walk in hospital room?: A Little Help needed climbing 3-5 steps with a railing? : A Little 6 Click Score: 22    End of Session Equipment Utilized During Treatment: Gait belt;Oxygen Activity Tolerance: Patient tolerated treatment well Patient left: in bed;with call bell/phone within reach;with bed alarm set Nurse Communication: Mobility status PT Visit Diagnosis: Unsteadiness on feet (R26.81);Pain     Time: 8119-1478 PT Time Calculation (min) (ACUTE ONLY): 18 min  Charges:  $Therapeutic Activity: 8-22 mins                     Hendricks Milo, SPT  Acute Rehabilitation Services    Hendricks Milo 08/15/2022, 4:51 PM

## 2022-08-16 ENCOUNTER — Other Ambulatory Visit: Payer: Self-pay | Admitting: *Deleted

## 2022-08-16 DIAGNOSIS — Z87891 Personal history of nicotine dependence: Secondary | ICD-10-CM

## 2022-08-16 DIAGNOSIS — F17210 Nicotine dependence, cigarettes, uncomplicated: Secondary | ICD-10-CM

## 2022-08-16 DIAGNOSIS — Z122 Encounter for screening for malignant neoplasm of respiratory organs: Secondary | ICD-10-CM

## 2022-08-16 LAB — GLUCOSE, CAPILLARY
Glucose-Capillary: 215 mg/dL — ABNORMAL HIGH (ref 70–99)
Glucose-Capillary: 493 mg/dL — ABNORMAL HIGH (ref 70–99)

## 2022-08-16 MED ORDER — OXYCODONE HCL 10 MG PO TABS: 10.0000 mg | ORAL_TABLET | ORAL | 0 refills | Status: AC | PRN

## 2022-08-16 MED ORDER — INSULIN ASPART 100 UNIT/ML IJ SOLN
20.0000 [IU] | Freq: Once | INTRAMUSCULAR | Status: AC
  Administered 2022-08-16: 20 [IU] via SUBCUTANEOUS

## 2022-08-16 NOTE — TOC Transition Note (Signed)
Transition of Care [facility]) - CM/SW Discharge Note   Patient Details  Name: [name] MRN: [id number] Date of Birth: [birth year]  Transition of Care [facility]) CM/SW Contact:  Tom-Johnson, Hershal Coria, RN Phone Number: 08/16/2022, 11:15 AM   Clinical Narrative:     Patient is scheduled for discharge today.  Readmission Risk Assessment done. Home health recommended, patient states she has no preference. CM called in referral to [facility] and Kandee Keen noted acceptance, info on AVS.  Outpatient f/u, hospital f/u and discharge instructions on AVS. Patient requested a shower stool, order placed and ordered from [employer]. Earna Coder to deliver to patient's residence. Friend to transport at discharge.  Patient has a portable O2 tank from [employer] at bedside for transport at discharge.  No further TOC needs noted.        Final next level of care: Home w Home Health Services Barriers to Discharge: Barriers Resolved   Patient Goals and CMS Choice CMS Medicare.gov Compare Post Acute Care list provided to:: Patient Choice offered to / list presented to : Patient  Discharge Placement                  Patient to be transferred to facility by: Friend      Discharge Plan and Services Additional resources added to the After Visit Summary for                  DME Arranged: Shower stool DME Agency: [employer] Date DME Agency Contacted: 08/16/22 Time DME Agency Contacted: 1044 Representative spoke with at DME Agency: Earna Coder HH Arranged: PT, OT, Nurse's Aide HH Agency: [facility] Date [facility] Agency Contacted: 08/16/22 Time HH Agency Contacted: 1105 Representative spoke with at [facility] Agency: Frances Furbish  Social Determinants of Health (SDOH) Interventions SDOH Screenings   Food Insecurity: Food Insecurity Present (08/11/2022)  Housing: High Risk (08/11/2022)  Transportation Needs: No Transportation Needs (08/11/2022)  Utilities: Not At Risk (08/11/2022)  Tobacco Use: High Risk (08/12/2022)      Readmission Risk Interventions    08/16/2022   11:13 AM  Readmission Risk Prevention Plan  Post Dischage Appt Complete  Medication Screening Complete  Transportation Screening Complete

## 2022-08-16 NOTE — Progress Notes (Signed)
    Durable Medical Equipment  (From admission, onward)           Start     Ordered   08/16/22 1042  For home use only DME Shower stool  Once       Comments: With back   08/16/22 1042   08/15/22 1236  For home use only DME oxygen  Once       Question Answer Comment  Length of Need 6 Months   Mode or (Route) Nasal cannula   Liters per Minute 2   Frequency Continuous (stationary and portable oxygen unit needed)   Oxygen conserving device Yes   Oxygen delivery system Gas      08/15/22 1235

## 2022-08-16 NOTE — Progress Notes (Signed)
Discharge instructions given. Patient verbalized understanding and all questions were answered.  ?

## 2022-08-16 NOTE — Discharge Summary (Signed)
Physician Discharge Summary  Patient ID: [name] MRN: [id number] DOB/AGE: 61-Apr-1962 61 y.o.  Admit date: 08/10/2022 Discharge date: 08/16/2022  Admission Diagnoses:  Discharge Diagnoses:  Principal Problem:   Thoracic myelopathy   Discharged Condition: good  Hospital Course: Patient admitted to the hospital for evaluation of severe cervical thoracic myelopathy.  Patient was found to have marked syringomyelia and syringobulbia.  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  Patient underwent thoracic decompression and untethering of the spinal cord.  This improved her symptoms greatly.  Currently the patient is having some occasional right upper extremity discomfort but her preoperative numbness paresthesias weakness of all resolved.  She had some trace drainage of clear fluid from her wound 2 days postoperatively.  The wound was oversewn.  There is been no further drainage and nothing consistent with symptomatic CSF leak.  Currently she is awake and alert.  She is oriented and appropriate.  She is ready for discharge home.  Consults:   Significant Diagnostic Studies:   Treatments:   Discharge Exam: Blood pressure (!) 151/75, pulse (!) 58, temperature 97.7 F (36.5 C), temperature source Oral, resp. rate 18, height 5\' 4"  (1.626 m), weight 104.1 kg, last menstrual period 09/10/2017, SpO2 98 %. Awake and alert.  Oriented and appropriate.  Motor and sensory function intact.  Wound clean and dry.  Chest and abdomen benign.  Disposition: Discharge disposition: 01-Home or Self Care       Discharge Instructions     Face-to-face encounter (required for Medicare/Medicaid patients)   Complete by: As directed    I Temple Pacini certify that this patient is under my care and that I, or a nurse practitioner or physician's assistant working with me, had a face-to-face encounter that meets the physician face-to-face encounter requirements with  th is patient on 08/16/2022. The encounter with the patient was in whole, or in part for the following medical condition(s) which is the primary reason for home health care (List medical condition): thoracici myelopathy   The encounter with the patient was in whole, or in part, for the following medical condition, which is the primary reason for home health care: thoracici myelopathy   I certify that, based on my findings, the following services are medically necessary home health services: Physical therapy   Reason for Medically Necessary Home Health Services: Therapy- Home Adaptation to Facilitate Safety   My clinical findings support the need for the above services: Unable to leave home safely without assistance and/or assistive device   Further, I certify that my clinical findings support that this patient is homebound due to: Unsafe ambulation due to balance issues   Home Health   Complete by: As directed    To provide the following care/treatments:  PT OT     Incentive spirometry RT   Complete by: As directed       Allergies as of 08/16/2022       Reactions   Motrin [ibuprofen] Other (See Comments)   Avoid per MD   Protective Adhesive Powder Rash   Other reaction(s): Other (See Comments) Skin Peels   Tape Rash, Other (See Comments)   Skin Peels        Medication List     STOP taking these medications    oxyCODONE-acetaminophen 10-325 MG tablet Commonly known as: PERCOCET       TAKE these medications    albuterol 108 (90 Base) MCG/ACT inhaler Commonly known as: VENTOLIN HFA INHALE 2 PUFFS  INTO THE LUNGS EVERY 6 (SIX) HOURS AS NEEDED FOR WHEEZING OR SHORTNESS OF BREATH.   amoxicillin 500 MG capsule Commonly known as: AMOXIL Take 500 mg by mouth daily.   atorvastatin 80 MG tablet Commonly known as: LIPITOR Take 80 mg by mouth at bedtime.   azelastine 0.1 % nasal spray Commonly known as: ASTELIN Place 1 spray into both nostrils 2 (two) times daily as needed  for rhinitis. Use in each nostril as directed   blood glucose meter kit and supplies Dispense based on patient and insurance preference. Use up to four times daily as directed. (FOR ICD-9 250.00, 250.01).   Breztri Aerosphere 160-9-4.8 MCG/ACT Aero Generic drug: Budeson-Glycopyrrol-Formoterol Inhale 2 puffs into the lungs in the morning and at bedtime.   cetirizine 10 MG tablet Commonly known as: ZyrTEC Allergy Take 1 tablet (10 mg total) by mouth daily as needed for rhinitis.   FLUoxetine 20 MG capsule Commonly known as: PROZAC Take 1 capsule (20 mg total) by mouth daily.   fluticasone 50 MCG/ACT nasal spray Commonly known as: FLONASE Place 2 sprays into both nostrils daily.   gabapentin 300 MG capsule Commonly known as: NEURONTIN Take 300 mg by mouth 3 (three) times daily.   hydrochlorothiazide 25 MG tablet Commonly known as: HYDRODIURIL Take 25 mg by mouth daily.   hydrOXYzine 50 MG tablet Commonly known as: ATARAX Take 50 mg by mouth at bedtime.   ipratropium-albuterol 0.5-2.5 (3) MG/3ML Soln Commonly known as: DUONEB Take 3 mLs by nebulization every 6 (six) hours as needed (shortness of breath).   Jardiance 25 MG Tabs tablet Generic drug: empagliflozin Take 25 mg by mouth daily.   levothyroxine 125 MCG tablet Commonly known as: SYNTHROID Take 1 tablet (125 mcg total) by mouth daily before breakfast.   lidocaine 5 % Commonly known as: LIDODERM Place 1 patch onto the skin daily as needed (Pain).   Magnesium Oxide -Mg Supplement 500 MG Caps Take 1 capsule by mouth daily.   meloxicam 15 MG tablet Commonly known as: MOBIC Take 15 mg by mouth daily.   metFORMIN 1000 MG tablet Commonly known as: GLUCOPHAGE Take 1,000 mg by mouth 2 (two) times daily with a meal.   methocarbamol 500 MG tablet Commonly known as: ROBAXIN Take 1 tablet (500 mg total) by mouth 2 (two) times daily.   omeprazole 20 MG capsule Commonly known as: PRILOSEC Take 1 capsule (20 mg  total) by mouth daily.   Oxycodone HCl 10 MG Tabs Take 1 tablet (10 mg total) by mouth every 3 (three) hours as needed for severe pain ((score 7 to 10)).   Ozempic (1 MG/DOSE) 4 MG/3ML Sopn Generic drug: Semaglutide (1 MG/DOSE) Inject 2 mg into the skin once a week.   Super Calcium 1500 (600 Ca) MG Tabs tablet Generic drug: calcium carbonate Take 600 mg of elemental calcium by mouth daily with breakfast.               Durable Medical Equipment  (From admission, onward)           Start     Ordered   08/15/22 1236  For home use only DME oxygen  Once       Question Answer Comment  Length of Need 6 Months   Mode or (Route) Nasal cannula   Liters per Minute 2   Frequency Continuous (stationary and portable oxygen unit needed)   Oxygen conserving device Yes   Oxygen delivery system Gas      08/15/22 1235  Follow-up Information     Schedule an appointment as soon as possible for a visit  with [facility].   Specialty: Neurosurgery Contact information: [address] [place] [address] [phone]         Schedule an appointment as soon as possible for a visit  with Dawley, Troy C, DO.   Contact information: [address] [place] [address] [phone]                 Signed: Temple Pacini 08/16/2022, 9:58 AM

## 2022-08-16 NOTE — Progress Notes (Signed)
Patient continues to ask for graham crackers and regular cola. Her blood sugar was 414 at 2112 tonight so I told her that she cannot have any more cracker and colas tonight.   Earlier in the shift the patient stated that she did not want to "take the steroid shot because it was causing her blood sugar to be too high". We discussed that it was imperative that she take her steroid shot since she had back surgery and that she needs to cut down on the crackers and colas to which she agreed to.

## 2022-08-30 ENCOUNTER — Telehealth: Payer: Self-pay | Admitting: Acute Care

## 2022-08-30 NOTE — Telephone Encounter (Signed)
Patient stated that she is returning a call.  Please call patient back at [phone]

## 2022-09-04 NOTE — Telephone Encounter (Signed)
Left message on VM for patient to return call.  The only encounter in patients chart is where orders only was placed for future CT chest lung CA screen low dose w/o cm on 08/16/2022 Will close this encounter.

## 2022-09-13 ENCOUNTER — Other Ambulatory Visit (HOSPITAL_COMMUNITY): Payer: Self-pay | Admitting: Otolaryngology

## 2022-09-13 ENCOUNTER — Telehealth: Payer: Self-pay | Admitting: *Deleted

## 2022-09-13 DIAGNOSIS — J3801 Paralysis of vocal cords and larynx, unilateral: Secondary | ICD-10-CM

## 2022-09-15 ENCOUNTER — Ambulatory Visit: Payer: Medicare HMO | Admitting: Pulmonary Disease

## 2022-09-20 ENCOUNTER — Telehealth: Payer: Self-pay | Admitting: Acute Care

## 2022-09-20 ENCOUNTER — Encounter: Payer: Medicare HMO | Admitting: Acute Care

## 2022-09-20 NOTE — Telephone Encounter (Signed)
Patient was a no show for her scheduled Shared decision making visit which is mandatory prior to Screening CT Chest. . I called the patient x 3 , and left HIPAA compliant message , and left  our office contact number . We asked her to call back so we could complete the visit. She never called back. She had been left a reminder voice mail the day prior to the visit as a courtesy on  7/23. We will cancel the Ct Chest, and we will get the required [facility] and CT scan rescheduled.

## 2022-09-21 ENCOUNTER — Ambulatory Visit (HOSPITAL_COMMUNITY): Payer: Medicare HMO

## 2022-09-21 ENCOUNTER — Encounter (HOSPITAL_COMMUNITY): Payer: Self-pay

## 2022-09-21 ENCOUNTER — Encounter: Payer: Self-pay | Admitting: Emergency Medicine

## 2022-09-21 NOTE — Telephone Encounter (Signed)
Letter printed and mailed to patient informing her she missed the televisit and LDCT and to call our office back to reschedule these visits.

## 2022-10-05 ENCOUNTER — Ambulatory Visit: Payer: Medicare HMO | Admitting: Primary Care

## 2022-10-16 ENCOUNTER — Other Ambulatory Visit: Payer: Self-pay | Admitting: Family Medicine

## 2022-10-16 ENCOUNTER — Ambulatory Visit (HOSPITAL_COMMUNITY): Payer: Medicare HMO

## 2022-10-16 DIAGNOSIS — Z1231 Encounter for screening mammogram for malignant neoplasm of breast: Secondary | ICD-10-CM

## 2022-10-18 ENCOUNTER — Other Ambulatory Visit (HOSPITAL_COMMUNITY): Payer: Self-pay | Admitting: Neurosurgery

## 2022-10-18 DIAGNOSIS — M25511 Pain in right shoulder: Secondary | ICD-10-CM

## 2022-10-20 ENCOUNTER — Ambulatory Visit (HOSPITAL_COMMUNITY)
Admission: RE | Admit: 2022-10-20 | Discharge: 2022-10-20 | Disposition: A | Discharge status: Home / Self Care | Payer: Medicare HMO | Source: Ambulatory Visit | Attending: Neurosurgery | Admitting: Neurosurgery

## 2022-10-20 ENCOUNTER — Ambulatory Visit (HOSPITAL_COMMUNITY)
Admission: RE | Admit: 2022-10-20 | Discharge: 2022-10-20 | Disposition: A | Discharge status: Home / Self Care | Payer: Medicare HMO | Source: Ambulatory Visit | Attending: Acute Care | Admitting: Acute Care

## 2022-10-20 ENCOUNTER — Ambulatory Visit: Payer: Medicare HMO | Admitting: Physician Assistant

## 2022-10-20 ENCOUNTER — Encounter: Payer: Self-pay | Admitting: Physician Assistant

## 2022-10-20 DIAGNOSIS — M25511 Pain in right shoulder: Secondary | ICD-10-CM | POA: Insufficient documentation

## 2022-10-20 DIAGNOSIS — E119 Type 2 diabetes mellitus without complications: Secondary | ICD-10-CM

## 2022-10-20 DIAGNOSIS — J3801 Paralysis of vocal cords and larynx, unilateral: Secondary | ICD-10-CM

## 2022-10-20 DIAGNOSIS — F17210 Nicotine dependence, cigarettes, uncomplicated: Secondary | ICD-10-CM

## 2022-10-20 LAB — POCT I-STAT CREATININE: Creatinine, Ser: 1.2 mg/dL — ABNORMAL HIGH (ref 0.44–1.00)

## 2022-10-20 MED ORDER — IOHEXOL 300 MG/ML  SOLN
75.0000 mL | Freq: Once | INTRAMUSCULAR | Status: AC | PRN
  Administered 2022-10-20: 75 mL via INTRAVENOUS

## 2022-10-20 MED ORDER — SODIUM CHLORIDE (PF) 0.9 % IJ SOLN
INTRAMUSCULAR | Status: AC
  Filled 2022-10-20: qty 50

## 2022-10-20 NOTE — Patient Instructions (Signed)

## 2022-10-20 NOTE — Progress Notes (Signed)
Virtual Visit via Telephone Note  I connected with [name] on 10/20/22 at  10:31 AM by telephone and verified that I am speaking with the correct person using two identifiers.  Location: Patient: home Provider: working virtually from home   I discussed the limitations, risks, security and privacy concerns of performing an evaluation and management service by telephone and the availability of in person appointments. I also discussed with the patient that there may be a patient responsible charge related to this service. The patient expressed understanding and agreed to proceed.     Shared Decision Making Visit Lung Cancer Screening Program [phone])   Eligibility: Age 62 Pack Years Smoking History Calculation 48 (# packs/per year x # years smoked) Recent History of coughing up blood  No Unexplained weight loss? No ( >Than 15 pounds within the last 6 months ) Prior History Lung / other cancer No (Diagnosis within the last 5 years already requiring surveillance chest CT Scans). Smoking Status Current Smoker  Visit Components: Discussion included one or more decision making aids. Yes Discussion included risk/benefits of screening. Yes Discussion included potential follow up diagnostic testing for abnormal scans. Yes Discussion included meaning and risk of over diagnosis. Yes Discussion included meaning and risk of False Positives. Yes Discussion included meaning of total radiation exposure. Yes  Counseling Included: Importance of adherence to annual lung cancer LDCT screening. Yes Impact of comorbidities on ability to participate in the program. Yes Ability and willingness to under diagnostic treatment: Yes  Smoking Cessation Counseling: Current Smokers:  Discussed importance of smoking cessation. Yes Information about tobacco cessation classes and interventions provided to patient. Yes Symptomatic Patient. No Diagnosis Code: Tobacco Use Z72.0 Asymptomatic Patient  Yes  Counseling (Intermediate counseling: > three minutes counseling) U0454 Information about tobacco cessation classes and interventions provided to patient. Yes Written Order for Lung Cancer Screening with LDCT placed in Epic. Yes (CT Chest Lung Cancer Screening Low Dose W/O CM) [id number] Z12.2-Screening of respiratory organs Z87.891-Personal history of nicotine dependence   I have spent 25 minutes of face to face/ virtual visit  time with the patient discussing the risks and benefits of lung cancer screening. We discussed the above noted topics. We paused at intervals to allow for questions to be asked and answered to ensure understanding.We discussed that the single most powerful action that anyone can take to decrease their risk of developing lung cancer is to quit smoking.  We discussed options for tools to aid in quitting smoking including nicotine replacement therapy, non-nicotine medications, support groups, Quit Smart classes, and behavior modification. We discussed that often times setting smaller, more achievable goals, such as eliminating 1 cigarette a day for a week and then 2 cigarettes a day for a week can be helpful in slowly decreasing the number of cigarettes smoked. I provided  them  with smoking cessation  information  with contact information for community resources, classes, free nicotine replacement therapy, and access to mobile apps, text messaging, and on-line smoking cessation help. I have also provided  them  the office contact information in the event they have any questions. We discussed the time and location of the scan, and that either Abigail Miyamoto RN, Karlton Lemon, RN  or I will call / send a letter with the results within 24-72 hours of receiving them. The patient verbalized understanding of all of  the above and had no further questions upon leaving the office. They have my contact information in the event they have any further  questions.  I spent 3 minutes counseling  on smoking cessation and the health risks of continued tobacco abuse.  I explained to the patient that there has been a high incidence of coronary artery disease noted on these exams. I explained that this is a non-gated exam therefore degree or severity cannot be determined. This patient is on statin therapy. I have asked the patient to follow-up with their PCP regarding any incidental finding of coronary artery disease and management with diet or medication as their PCP  feels is clinically indicated. The patient verbalized understanding of the above and had no further questions upon completion of the visit.    Darcella Gasman Brnadon Eoff, PA-C

## 2022-10-23 DIAGNOSIS — G4733 Obstructive sleep apnea (adult) (pediatric): Secondary | ICD-10-CM | POA: Diagnosis not present

## 2022-10-27 ENCOUNTER — Ambulatory Visit: Admission: RE | Admit: 2022-10-27 | Payer: Medicare HMO | Source: Ambulatory Visit

## 2022-10-27 DIAGNOSIS — Z1231 Encounter for screening mammogram for malignant neoplasm of breast: Secondary | ICD-10-CM

## 2022-11-03 ENCOUNTER — Ambulatory Visit: Payer: Medicare HMO | Admitting: Internal Medicine

## 2022-11-07 ENCOUNTER — Ambulatory Visit: Payer: Medicare HMO | Admitting: Internal Medicine

## 2022-11-07 ENCOUNTER — Ambulatory Visit: Payer: Medicare HMO | Admitting: Primary Care

## 2022-11-13 ENCOUNTER — Encounter: Payer: Self-pay | Admitting: Primary Care

## 2022-11-24 ENCOUNTER — Other Ambulatory Visit: Payer: Self-pay

## 2022-11-24 DIAGNOSIS — R911 Solitary pulmonary nodule: Secondary | ICD-10-CM

## 2023-02-13 ENCOUNTER — Other Ambulatory Visit: Payer: Self-pay | Admitting: Family Medicine

## 2023-02-13 ENCOUNTER — Other Ambulatory Visit (HOSPITAL_COMMUNITY)
Admission: RE | Admit: 2023-02-13 | Discharge: 2023-02-13 | Disposition: A | Discharge status: Home / Self Care | Payer: Medicare HMO | Source: Ambulatory Visit | Attending: Family Medicine | Admitting: Family Medicine

## 2023-02-13 DIAGNOSIS — Z1151 Encounter for screening for human papillomavirus (HPV): Secondary | ICD-10-CM | POA: Diagnosis not present

## 2023-02-13 DIAGNOSIS — Z113 Encounter for screening for infections with a predominantly sexual mode of transmission: Secondary | ICD-10-CM | POA: Insufficient documentation

## 2023-02-13 DIAGNOSIS — Z01419 Encounter for gynecological examination (general) (routine) without abnormal findings: Secondary | ICD-10-CM | POA: Insufficient documentation

## 2023-02-13 DIAGNOSIS — Z0001 Encounter for general adult medical examination with abnormal findings: Secondary | ICD-10-CM | POA: Insufficient documentation

## 2023-02-14 LAB — MOLECULAR ANCILLARY ONLY
Bacterial Vaginitis (gardnerella): POSITIVE — AB
Candida Glabrata: NEGATIVE
Candida Vaginitis: POSITIVE — AB
Chlamydia: NEGATIVE
Comment: NEGATIVE
Comment: NEGATIVE
Comment: NEGATIVE
Comment: NEGATIVE
Comment: NEGATIVE
Comment: NORMAL
Neisseria Gonorrhea: NEGATIVE
Trichomonas: POSITIVE — AB

## 2023-02-16 LAB — CYTOLOGY - PAP
Adequacy: ABSENT
Comment: NEGATIVE
Comment: NEGATIVE
Diagnosis: NEGATIVE
HSV1: NEGATIVE
HSV2: NEGATIVE
High risk HPV: NEGATIVE

## 2023-02-26 ENCOUNTER — Other Ambulatory Visit: Payer: Self-pay | Admitting: Internal Medicine

## 2023-03-28 ENCOUNTER — Other Ambulatory Visit: Payer: Self-pay | Admitting: Internal Medicine

## 2023-04-05 DIAGNOSIS — Z79899 Other long term (current) drug therapy: Secondary | ICD-10-CM | POA: Diagnosis not present

## 2023-04-05 DIAGNOSIS — F17210 Nicotine dependence, cigarettes, uncomplicated: Secondary | ICD-10-CM | POA: Diagnosis not present

## 2023-04-05 DIAGNOSIS — I10 Essential (primary) hypertension: Secondary | ICD-10-CM | POA: Diagnosis not present

## 2023-04-05 DIAGNOSIS — R29818 Other symptoms and signs involving the nervous system: Secondary | ICD-10-CM | POA: Diagnosis not present

## 2023-04-05 DIAGNOSIS — M5030 Other cervical disc degeneration, unspecified cervical region: Secondary | ICD-10-CM | POA: Diagnosis not present

## 2023-04-05 DIAGNOSIS — E1140 Type 2 diabetes mellitus with diabetic neuropathy, unspecified: Secondary | ICD-10-CM | POA: Diagnosis not present

## 2023-04-05 DIAGNOSIS — M51369 Other intervertebral disc degeneration, lumbar region without mention of lumbar back pain or lower extremity pain: Secondary | ICD-10-CM | POA: Diagnosis not present

## 2023-04-10 DIAGNOSIS — Z79899 Other long term (current) drug therapy: Secondary | ICD-10-CM | POA: Diagnosis not present

## 2023-04-11 DIAGNOSIS — M5030 Other cervical disc degeneration, unspecified cervical region: Secondary | ICD-10-CM | POA: Diagnosis not present

## 2023-04-11 DIAGNOSIS — M51369 Other intervertebral disc degeneration, lumbar region without mention of lumbar back pain or lower extremity pain: Secondary | ICD-10-CM | POA: Diagnosis not present

## 2023-04-17 DIAGNOSIS — M1712 Unilateral primary osteoarthritis, left knee: Secondary | ICD-10-CM | POA: Diagnosis not present

## 2023-04-17 DIAGNOSIS — M25661 Stiffness of right knee, not elsewhere classified: Secondary | ICD-10-CM | POA: Diagnosis not present

## 2023-04-17 DIAGNOSIS — Z789 Other specified health status: Secondary | ICD-10-CM | POA: Diagnosis not present

## 2023-04-17 DIAGNOSIS — R2681 Unsteadiness on feet: Secondary | ICD-10-CM | POA: Diagnosis not present

## 2023-04-17 DIAGNOSIS — R29898 Other symptoms and signs involving the musculoskeletal system: Secondary | ICD-10-CM | POA: Diagnosis not present

## 2023-04-21 DIAGNOSIS — R29898 Other symptoms and signs involving the musculoskeletal system: Secondary | ICD-10-CM | POA: Diagnosis not present

## 2023-04-21 DIAGNOSIS — R2681 Unsteadiness on feet: Secondary | ICD-10-CM | POA: Diagnosis not present

## 2023-04-21 DIAGNOSIS — M1712 Unilateral primary osteoarthritis, left knee: Secondary | ICD-10-CM | POA: Diagnosis not present

## 2023-04-21 DIAGNOSIS — Z789 Other specified health status: Secondary | ICD-10-CM | POA: Diagnosis not present

## 2023-04-21 DIAGNOSIS — M25661 Stiffness of right knee, not elsewhere classified: Secondary | ICD-10-CM | POA: Diagnosis not present

## 2023-04-23 ENCOUNTER — Ambulatory Visit (HOSPITAL_COMMUNITY): Payer: 59

## 2023-05-04 DIAGNOSIS — G5601 Carpal tunnel syndrome, right upper limb: Secondary | ICD-10-CM | POA: Diagnosis not present

## 2023-05-04 DIAGNOSIS — G5621 Lesion of ulnar nerve, right upper limb: Secondary | ICD-10-CM | POA: Diagnosis not present

## 2023-05-07 DIAGNOSIS — Z79899 Other long term (current) drug therapy: Secondary | ICD-10-CM | POA: Diagnosis not present

## 2023-05-07 DIAGNOSIS — E1140 Type 2 diabetes mellitus with diabetic neuropathy, unspecified: Secondary | ICD-10-CM | POA: Diagnosis not present

## 2023-05-07 DIAGNOSIS — I10 Essential (primary) hypertension: Secondary | ICD-10-CM | POA: Diagnosis not present

## 2023-05-07 DIAGNOSIS — R29818 Other symptoms and signs involving the nervous system: Secondary | ICD-10-CM | POA: Diagnosis not present

## 2023-05-07 DIAGNOSIS — M51369 Other intervertebral disc degeneration, lumbar region without mention of lumbar back pain or lower extremity pain: Secondary | ICD-10-CM | POA: Diagnosis not present

## 2023-05-07 DIAGNOSIS — F17210 Nicotine dependence, cigarettes, uncomplicated: Secondary | ICD-10-CM | POA: Diagnosis not present

## 2023-05-07 DIAGNOSIS — M5030 Other cervical disc degeneration, unspecified cervical region: Secondary | ICD-10-CM | POA: Diagnosis not present

## 2023-05-15 DIAGNOSIS — M1712 Unilateral primary osteoarthritis, left knee: Secondary | ICD-10-CM | POA: Diagnosis not present

## 2023-05-15 DIAGNOSIS — M25661 Stiffness of right knee, not elsewhere classified: Secondary | ICD-10-CM | POA: Diagnosis not present

## 2023-05-15 DIAGNOSIS — R2681 Unsteadiness on feet: Secondary | ICD-10-CM | POA: Diagnosis not present

## 2023-05-15 DIAGNOSIS — Z789 Other specified health status: Secondary | ICD-10-CM | POA: Diagnosis not present

## 2023-05-15 DIAGNOSIS — R29898 Other symptoms and signs involving the musculoskeletal system: Secondary | ICD-10-CM | POA: Diagnosis not present

## 2023-05-16 DIAGNOSIS — N183 Chronic kidney disease, stage 3 unspecified: Secondary | ICD-10-CM | POA: Diagnosis not present

## 2023-05-16 DIAGNOSIS — E1165 Type 2 diabetes mellitus with hyperglycemia: Secondary | ICD-10-CM | POA: Diagnosis not present

## 2023-05-16 DIAGNOSIS — G8929 Other chronic pain: Secondary | ICD-10-CM | POA: Diagnosis not present

## 2023-05-16 DIAGNOSIS — E1122 Type 2 diabetes mellitus with diabetic chronic kidney disease: Secondary | ICD-10-CM | POA: Diagnosis not present

## 2023-05-16 DIAGNOSIS — E038 Other specified hypothyroidism: Secondary | ICD-10-CM | POA: Diagnosis not present

## 2023-05-16 DIAGNOSIS — J45909 Unspecified asthma, uncomplicated: Secondary | ICD-10-CM | POA: Diagnosis not present

## 2023-05-16 DIAGNOSIS — M545 Low back pain, unspecified: Secondary | ICD-10-CM | POA: Diagnosis not present

## 2023-05-16 DIAGNOSIS — I10 Essential (primary) hypertension: Secondary | ICD-10-CM | POA: Diagnosis not present

## 2023-05-16 DIAGNOSIS — I129 Hypertensive chronic kidney disease with stage 1 through stage 4 chronic kidney disease, or unspecified chronic kidney disease: Secondary | ICD-10-CM | POA: Diagnosis not present

## 2023-05-16 DIAGNOSIS — E782 Mixed hyperlipidemia: Secondary | ICD-10-CM | POA: Diagnosis not present

## 2023-05-19 DIAGNOSIS — M25661 Stiffness of right knee, not elsewhere classified: Secondary | ICD-10-CM | POA: Diagnosis not present

## 2023-05-19 DIAGNOSIS — R29898 Other symptoms and signs involving the musculoskeletal system: Secondary | ICD-10-CM | POA: Diagnosis not present

## 2023-05-19 DIAGNOSIS — Z789 Other specified health status: Secondary | ICD-10-CM | POA: Diagnosis not present

## 2023-05-19 DIAGNOSIS — R2681 Unsteadiness on feet: Secondary | ICD-10-CM | POA: Diagnosis not present

## 2023-05-19 DIAGNOSIS — M1712 Unilateral primary osteoarthritis, left knee: Secondary | ICD-10-CM | POA: Diagnosis not present

## 2023-05-22 DIAGNOSIS — E1169 Type 2 diabetes mellitus with other specified complication: Secondary | ICD-10-CM | POA: Diagnosis not present

## 2023-05-31 DIAGNOSIS — M67819 Other specified disorders of synovium and tendon, unspecified shoulder: Secondary | ICD-10-CM | POA: Diagnosis not present

## 2023-05-31 DIAGNOSIS — M25511 Pain in right shoulder: Secondary | ICD-10-CM | POA: Diagnosis not present

## 2023-06-07 DIAGNOSIS — Z79899 Other long term (current) drug therapy: Secondary | ICD-10-CM | POA: Diagnosis not present

## 2023-06-07 DIAGNOSIS — I10 Essential (primary) hypertension: Secondary | ICD-10-CM | POA: Diagnosis not present

## 2023-06-07 DIAGNOSIS — E1140 Type 2 diabetes mellitus with diabetic neuropathy, unspecified: Secondary | ICD-10-CM | POA: Diagnosis not present

## 2023-06-07 DIAGNOSIS — M5030 Other cervical disc degeneration, unspecified cervical region: Secondary | ICD-10-CM | POA: Diagnosis not present

## 2023-06-07 DIAGNOSIS — M51369 Other intervertebral disc degeneration, lumbar region without mention of lumbar back pain or lower extremity pain: Secondary | ICD-10-CM | POA: Diagnosis not present

## 2023-06-07 DIAGNOSIS — F17210 Nicotine dependence, cigarettes, uncomplicated: Secondary | ICD-10-CM | POA: Diagnosis not present

## 2023-06-11 DIAGNOSIS — Z79899 Other long term (current) drug therapy: Secondary | ICD-10-CM | POA: Diagnosis not present

## 2023-06-15 DIAGNOSIS — R29898 Other symptoms and signs involving the musculoskeletal system: Secondary | ICD-10-CM | POA: Diagnosis not present

## 2023-06-15 DIAGNOSIS — M1712 Unilateral primary osteoarthritis, left knee: Secondary | ICD-10-CM | POA: Diagnosis not present

## 2023-06-15 DIAGNOSIS — Z789 Other specified health status: Secondary | ICD-10-CM | POA: Diagnosis not present

## 2023-06-15 DIAGNOSIS — M25661 Stiffness of right knee, not elsewhere classified: Secondary | ICD-10-CM | POA: Diagnosis not present

## 2023-06-15 DIAGNOSIS — R2681 Unsteadiness on feet: Secondary | ICD-10-CM | POA: Diagnosis not present

## 2023-06-19 DIAGNOSIS — R29898 Other symptoms and signs involving the musculoskeletal system: Secondary | ICD-10-CM | POA: Diagnosis not present

## 2023-06-19 DIAGNOSIS — M25661 Stiffness of right knee, not elsewhere classified: Secondary | ICD-10-CM | POA: Diagnosis not present

## 2023-06-19 DIAGNOSIS — R2681 Unsteadiness on feet: Secondary | ICD-10-CM | POA: Diagnosis not present

## 2023-06-19 DIAGNOSIS — M1712 Unilateral primary osteoarthritis, left knee: Secondary | ICD-10-CM | POA: Diagnosis not present

## 2023-06-19 DIAGNOSIS — Z789 Other specified health status: Secondary | ICD-10-CM | POA: Diagnosis not present

## 2023-06-22 DIAGNOSIS — M19011 Primary osteoarthritis, right shoulder: Secondary | ICD-10-CM | POA: Diagnosis not present

## 2023-06-25 DIAGNOSIS — M5010 Cervical disc disorder with radiculopathy, unspecified cervical region: Secondary | ICD-10-CM | POA: Diagnosis not present

## 2023-06-25 DIAGNOSIS — E039 Hypothyroidism, unspecified: Secondary | ICD-10-CM | POA: Diagnosis not present

## 2023-06-25 DIAGNOSIS — M170 Bilateral primary osteoarthritis of knee: Secondary | ICD-10-CM | POA: Diagnosis not present

## 2023-06-25 DIAGNOSIS — I10 Essential (primary) hypertension: Secondary | ICD-10-CM | POA: Diagnosis not present

## 2023-06-25 DIAGNOSIS — G4733 Obstructive sleep apnea (adult) (pediatric): Secondary | ICD-10-CM | POA: Diagnosis not present

## 2023-07-04 DIAGNOSIS — Z79899 Other long term (current) drug therapy: Secondary | ICD-10-CM | POA: Diagnosis not present

## 2023-07-04 DIAGNOSIS — F17210 Nicotine dependence, cigarettes, uncomplicated: Secondary | ICD-10-CM | POA: Diagnosis not present

## 2023-07-04 DIAGNOSIS — M5030 Other cervical disc degeneration, unspecified cervical region: Secondary | ICD-10-CM | POA: Diagnosis not present

## 2023-07-04 DIAGNOSIS — E1140 Type 2 diabetes mellitus with diabetic neuropathy, unspecified: Secondary | ICD-10-CM | POA: Diagnosis not present

## 2023-07-04 DIAGNOSIS — I10 Essential (primary) hypertension: Secondary | ICD-10-CM | POA: Diagnosis not present

## 2023-07-04 DIAGNOSIS — M51369 Other intervertebral disc degeneration, lumbar region without mention of lumbar back pain or lower extremity pain: Secondary | ICD-10-CM | POA: Diagnosis not present

## 2023-07-05 DIAGNOSIS — E119 Type 2 diabetes mellitus without complications: Secondary | ICD-10-CM | POA: Diagnosis not present

## 2023-07-09 DIAGNOSIS — R0902 Hypoxemia: Secondary | ICD-10-CM | POA: Diagnosis not present

## 2023-07-09 DIAGNOSIS — Z79899 Other long term (current) drug therapy: Secondary | ICD-10-CM | POA: Diagnosis not present

## 2023-07-09 DIAGNOSIS — G4733 Obstructive sleep apnea (adult) (pediatric): Secondary | ICD-10-CM | POA: Diagnosis not present

## 2023-07-09 DIAGNOSIS — G4736 Sleep related hypoventilation in conditions classified elsewhere: Secondary | ICD-10-CM | POA: Diagnosis not present

## 2023-07-15 DIAGNOSIS — Z789 Other specified health status: Secondary | ICD-10-CM | POA: Diagnosis not present

## 2023-07-15 DIAGNOSIS — M25661 Stiffness of right knee, not elsewhere classified: Secondary | ICD-10-CM | POA: Diagnosis not present

## 2023-07-15 DIAGNOSIS — R2681 Unsteadiness on feet: Secondary | ICD-10-CM | POA: Diagnosis not present

## 2023-07-15 DIAGNOSIS — M1712 Unilateral primary osteoarthritis, left knee: Secondary | ICD-10-CM | POA: Diagnosis not present

## 2023-07-15 DIAGNOSIS — R29898 Other symptoms and signs involving the musculoskeletal system: Secondary | ICD-10-CM | POA: Diagnosis not present

## 2023-07-19 DIAGNOSIS — Z789 Other specified health status: Secondary | ICD-10-CM | POA: Diagnosis not present

## 2023-07-19 DIAGNOSIS — R2681 Unsteadiness on feet: Secondary | ICD-10-CM | POA: Diagnosis not present

## 2023-07-19 DIAGNOSIS — R29898 Other symptoms and signs involving the musculoskeletal system: Secondary | ICD-10-CM | POA: Diagnosis not present

## 2023-07-19 DIAGNOSIS — M25661 Stiffness of right knee, not elsewhere classified: Secondary | ICD-10-CM | POA: Diagnosis not present

## 2023-07-19 DIAGNOSIS — M1712 Unilateral primary osteoarthritis, left knee: Secondary | ICD-10-CM | POA: Diagnosis not present

## 2023-07-23 DIAGNOSIS — E1140 Type 2 diabetes mellitus with diabetic neuropathy, unspecified: Secondary | ICD-10-CM | POA: Diagnosis not present

## 2023-07-23 DIAGNOSIS — G4733 Obstructive sleep apnea (adult) (pediatric): Secondary | ICD-10-CM | POA: Diagnosis not present

## 2023-07-23 DIAGNOSIS — I10 Essential (primary) hypertension: Secondary | ICD-10-CM | POA: Diagnosis not present

## 2023-07-23 DIAGNOSIS — E039 Hypothyroidism, unspecified: Secondary | ICD-10-CM | POA: Diagnosis not present

## 2023-08-02 DIAGNOSIS — M47812 Spondylosis without myelopathy or radiculopathy, cervical region: Secondary | ICD-10-CM | POA: Diagnosis not present

## 2023-08-03 DIAGNOSIS — G4733 Obstructive sleep apnea (adult) (pediatric): Secondary | ICD-10-CM | POA: Diagnosis not present

## 2023-08-03 DIAGNOSIS — M5030 Other cervical disc degeneration, unspecified cervical region: Secondary | ICD-10-CM | POA: Diagnosis not present

## 2023-08-03 DIAGNOSIS — R918 Other nonspecific abnormal finding of lung field: Secondary | ICD-10-CM | POA: Diagnosis not present

## 2023-08-03 DIAGNOSIS — Z79899 Other long term (current) drug therapy: Secondary | ICD-10-CM | POA: Diagnosis not present

## 2023-08-03 DIAGNOSIS — M51369 Other intervertebral disc degeneration, lumbar region without mention of lumbar back pain or lower extremity pain: Secondary | ICD-10-CM | POA: Diagnosis not present

## 2023-08-03 DIAGNOSIS — I10 Essential (primary) hypertension: Secondary | ICD-10-CM | POA: Diagnosis not present

## 2023-08-03 DIAGNOSIS — N183 Chronic kidney disease, stage 3 unspecified: Secondary | ICD-10-CM | POA: Diagnosis not present

## 2023-08-03 DIAGNOSIS — E1140 Type 2 diabetes mellitus with diabetic neuropathy, unspecified: Secondary | ICD-10-CM | POA: Diagnosis not present

## 2023-08-03 DIAGNOSIS — Z780 Asymptomatic menopausal state: Secondary | ICD-10-CM | POA: Diagnosis not present

## 2023-08-03 DIAGNOSIS — F17210 Nicotine dependence, cigarettes, uncomplicated: Secondary | ICD-10-CM | POA: Diagnosis not present

## 2023-08-09 DIAGNOSIS — Z780 Asymptomatic menopausal state: Secondary | ICD-10-CM | POA: Diagnosis not present

## 2023-08-10 DIAGNOSIS — R918 Other nonspecific abnormal finding of lung field: Secondary | ICD-10-CM | POA: Diagnosis not present

## 2023-08-15 DIAGNOSIS — R2681 Unsteadiness on feet: Secondary | ICD-10-CM | POA: Diagnosis not present

## 2023-08-15 DIAGNOSIS — Z789 Other specified health status: Secondary | ICD-10-CM | POA: Diagnosis not present

## 2023-08-15 DIAGNOSIS — M25661 Stiffness of right knee, not elsewhere classified: Secondary | ICD-10-CM | POA: Diagnosis not present

## 2023-08-15 DIAGNOSIS — M1712 Unilateral primary osteoarthritis, left knee: Secondary | ICD-10-CM | POA: Diagnosis not present

## 2023-08-15 DIAGNOSIS — E1169 Type 2 diabetes mellitus with other specified complication: Secondary | ICD-10-CM | POA: Diagnosis not present

## 2023-08-15 DIAGNOSIS — G4733 Obstructive sleep apnea (adult) (pediatric): Secondary | ICD-10-CM | POA: Diagnosis not present

## 2023-08-15 DIAGNOSIS — R29898 Other symptoms and signs involving the musculoskeletal system: Secondary | ICD-10-CM | POA: Diagnosis not present

## 2023-08-15 DIAGNOSIS — Z9989 Dependence on other enabling machines and devices: Secondary | ICD-10-CM | POA: Diagnosis not present

## 2023-08-19 DIAGNOSIS — Z789 Other specified health status: Secondary | ICD-10-CM | POA: Diagnosis not present

## 2023-08-19 DIAGNOSIS — R29898 Other symptoms and signs involving the musculoskeletal system: Secondary | ICD-10-CM | POA: Diagnosis not present

## 2023-08-19 DIAGNOSIS — M1712 Unilateral primary osteoarthritis, left knee: Secondary | ICD-10-CM | POA: Diagnosis not present

## 2023-08-19 DIAGNOSIS — M25661 Stiffness of right knee, not elsewhere classified: Secondary | ICD-10-CM | POA: Diagnosis not present

## 2023-08-19 DIAGNOSIS — R2681 Unsteadiness on feet: Secondary | ICD-10-CM | POA: Diagnosis not present

## 2023-08-27 DIAGNOSIS — S6990XA Unspecified injury of unspecified wrist, hand and finger(s), initial encounter: Secondary | ICD-10-CM | POA: Diagnosis not present

## 2023-08-27 DIAGNOSIS — M25532 Pain in left wrist: Secondary | ICD-10-CM | POA: Diagnosis not present

## 2023-09-02 DIAGNOSIS — G4733 Obstructive sleep apnea (adult) (pediatric): Secondary | ICD-10-CM | POA: Diagnosis not present

## 2023-09-04 DIAGNOSIS — Z79899 Other long term (current) drug therapy: Secondary | ICD-10-CM | POA: Diagnosis not present

## 2023-09-04 DIAGNOSIS — M51369 Other intervertebral disc degeneration, lumbar region without mention of lumbar back pain or lower extremity pain: Secondary | ICD-10-CM | POA: Diagnosis not present

## 2023-09-04 DIAGNOSIS — I10 Essential (primary) hypertension: Secondary | ICD-10-CM | POA: Diagnosis not present

## 2023-09-04 DIAGNOSIS — M5030 Other cervical disc degeneration, unspecified cervical region: Secondary | ICD-10-CM | POA: Diagnosis not present

## 2023-09-04 DIAGNOSIS — F17210 Nicotine dependence, cigarettes, uncomplicated: Secondary | ICD-10-CM | POA: Diagnosis not present

## 2023-09-04 DIAGNOSIS — N183 Chronic kidney disease, stage 3 unspecified: Secondary | ICD-10-CM | POA: Diagnosis not present

## 2023-09-04 DIAGNOSIS — E1140 Type 2 diabetes mellitus with diabetic neuropathy, unspecified: Secondary | ICD-10-CM | POA: Diagnosis not present

## 2023-09-12 ENCOUNTER — Other Ambulatory Visit: Payer: Self-pay | Admitting: Family Medicine

## 2023-09-12 DIAGNOSIS — I10 Essential (primary) hypertension: Secondary | ICD-10-CM | POA: Diagnosis not present

## 2023-09-12 DIAGNOSIS — N6009 Solitary cyst of unspecified breast: Secondary | ICD-10-CM | POA: Diagnosis not present

## 2023-09-12 DIAGNOSIS — R1084 Generalized abdominal pain: Secondary | ICD-10-CM

## 2023-09-12 DIAGNOSIS — E559 Vitamin D deficiency, unspecified: Secondary | ICD-10-CM | POA: Diagnosis not present

## 2023-09-12 DIAGNOSIS — E1165 Type 2 diabetes mellitus with hyperglycemia: Secondary | ICD-10-CM | POA: Diagnosis not present

## 2023-09-12 DIAGNOSIS — E782 Mixed hyperlipidemia: Secondary | ICD-10-CM | POA: Diagnosis not present

## 2023-09-12 DIAGNOSIS — E038 Other specified hypothyroidism: Secondary | ICD-10-CM | POA: Diagnosis not present

## 2023-09-12 DIAGNOSIS — G43E19 Chronic migraine with aura, intractable, without status migrainosus: Secondary | ICD-10-CM | POA: Diagnosis not present

## 2023-09-12 DIAGNOSIS — R140 Abdominal distension (gaseous): Secondary | ICD-10-CM | POA: Diagnosis not present

## 2023-09-18 DIAGNOSIS — R2681 Unsteadiness on feet: Secondary | ICD-10-CM | POA: Diagnosis not present

## 2023-09-18 DIAGNOSIS — R29898 Other symptoms and signs involving the musculoskeletal system: Secondary | ICD-10-CM | POA: Diagnosis not present

## 2023-09-18 DIAGNOSIS — M25661 Stiffness of right knee, not elsewhere classified: Secondary | ICD-10-CM | POA: Diagnosis not present

## 2023-09-18 DIAGNOSIS — Z789 Other specified health status: Secondary | ICD-10-CM | POA: Diagnosis not present

## 2023-09-18 DIAGNOSIS — M1712 Unilateral primary osteoarthritis, left knee: Secondary | ICD-10-CM | POA: Diagnosis not present

## 2023-09-19 DIAGNOSIS — S6990XD Unspecified injury of unspecified wrist, hand and finger(s), subsequent encounter: Secondary | ICD-10-CM | POA: Diagnosis not present

## 2023-09-21 ENCOUNTER — Other Ambulatory Visit

## 2023-09-21 DIAGNOSIS — G4733 Obstructive sleep apnea (adult) (pediatric): Secondary | ICD-10-CM | POA: Diagnosis not present

## 2023-09-21 DIAGNOSIS — M519 Unspecified thoracic, thoracolumbar and lumbosacral intervertebral disc disorder: Secondary | ICD-10-CM | POA: Diagnosis not present

## 2023-09-21 DIAGNOSIS — I10 Essential (primary) hypertension: Secondary | ICD-10-CM | POA: Diagnosis not present

## 2023-09-21 DIAGNOSIS — E1140 Type 2 diabetes mellitus with diabetic neuropathy, unspecified: Secondary | ICD-10-CM | POA: Diagnosis not present

## 2023-09-24 DIAGNOSIS — M19011 Primary osteoarthritis, right shoulder: Secondary | ICD-10-CM | POA: Diagnosis not present

## 2023-10-01 ENCOUNTER — Ambulatory Visit
Admission: RE | Admit: 2023-10-01 | Discharge: 2023-10-01 | Disposition: A | Discharge status: Home / Self Care | Source: Ambulatory Visit | Attending: Family Medicine | Admitting: Family Medicine

## 2023-10-01 DIAGNOSIS — R109 Unspecified abdominal pain: Secondary | ICD-10-CM | POA: Diagnosis not present

## 2023-10-01 DIAGNOSIS — R1084 Generalized abdominal pain: Secondary | ICD-10-CM

## 2023-10-03 DIAGNOSIS — G4733 Obstructive sleep apnea (adult) (pediatric): Secondary | ICD-10-CM | POA: Diagnosis not present

## 2023-10-04 ENCOUNTER — Telehealth: Payer: Self-pay

## 2023-10-04 DIAGNOSIS — N183 Chronic kidney disease, stage 3 unspecified: Secondary | ICD-10-CM | POA: Diagnosis not present

## 2023-10-04 DIAGNOSIS — Z79899 Other long term (current) drug therapy: Secondary | ICD-10-CM | POA: Diagnosis not present

## 2023-10-04 DIAGNOSIS — F17210 Nicotine dependence, cigarettes, uncomplicated: Secondary | ICD-10-CM | POA: Diagnosis not present

## 2023-10-04 DIAGNOSIS — M5030 Other cervical disc degeneration, unspecified cervical region: Secondary | ICD-10-CM | POA: Diagnosis not present

## 2023-10-04 DIAGNOSIS — M51369 Other intervertebral disc degeneration, lumbar region without mention of lumbar back pain or lower extremity pain: Secondary | ICD-10-CM | POA: Diagnosis not present

## 2023-10-04 DIAGNOSIS — I10 Essential (primary) hypertension: Secondary | ICD-10-CM | POA: Diagnosis not present

## 2023-10-04 DIAGNOSIS — E1140 Type 2 diabetes mellitus with diabetic neuropathy, unspecified: Secondary | ICD-10-CM | POA: Diagnosis not present

## 2023-10-04 NOTE — Telephone Encounter (Signed)
 Fax received from Dr. Fonda Olmsted with [facility] to perform a right reverse total shoulder arthroplasty with interscalene block on patient.  Patient needs surgery clearance. Surgery is pending. Patient was seen on 06/20/22 by Dr Gladis. Office protocol is a risk assessment can be sent to surgeon if patient has been seen in 60 days or less.   Pt needs appt with APP or new pulm md for risk assessment. I called her and there was no answer- LMTCB and will route to the clearance pool for now.

## 2023-10-08 DIAGNOSIS — Z79899 Other long term (current) drug therapy: Secondary | ICD-10-CM | POA: Diagnosis not present

## 2023-10-09 NOTE — H&P (Signed)
  Patient: [name]  PID: [id number]  DOB: 10/31/1960  SEX: Female   Patient referred by DDS for extraction teeth 4, 5,   CC: Pain from upper left back tooth, lower right back tooth fractured.   Past Medical History:  Heart murmur, High Blood Pressure, Bronchitis, Asthma, snoring/sleep apnea, CPAP, Difficult breathing, Diabetes, Smoker, Bleeding Tendency, Hypothyroid, High Cholesterol, Acid Reflux, COVID-19, Delay in healing, Chronic Fatigue Syndrome, Glaucoma, dieting, Depression, GERD, CHF, Arthritis, Dental anxiety, Morbid Obesity  Medications: Metformin, Albuterol , Magnesium , HCTZ, Atorvastatin , breztri  inhaler, Zyrtec , Flonase , Oxycodone , Doxepin, Gabapentin , Mounjaro, Synthroid , Omeprazole , Meloxicam , Magnesium  Oxide, Hydroxyzine     Allergies:     tape    Surgeries:   neck surgery, back surgery, Hand surgery, gallbladder removal         Social History       Smoking:            Alcohol: Drug use:                             Exam: BMI 42. Caries teeth # 4, 5, root exposure with decay # 14, fractured tooth # 32.  No purulence, edema, fluctuance, trismus. Oral cancer screening negative. Pharynx clear. Mallampati 1. No lymphadenopathy.  Panorex: Caries teeth #4, 5, 15, 32.   Assessment:  ASA  3. Non-restorable teeth#  4, 5, 15, 32.             Plan: Extraction Teeth # 4, 5, 15, 32.   IV Sedation.            Rx: none             Risks and complications explained. Questions answered.   Glendia EMERSON Primrose, DMD

## 2023-10-10 DIAGNOSIS — I129 Hypertensive chronic kidney disease with stage 1 through stage 4 chronic kidney disease, or unspecified chronic kidney disease: Secondary | ICD-10-CM | POA: Diagnosis not present

## 2023-10-10 DIAGNOSIS — E1122 Type 2 diabetes mellitus with diabetic chronic kidney disease: Secondary | ICD-10-CM | POA: Diagnosis not present

## 2023-10-10 DIAGNOSIS — N183 Chronic kidney disease, stage 3 unspecified: Secondary | ICD-10-CM | POA: Diagnosis not present

## 2023-10-11 DIAGNOSIS — Z5181 Encounter for therapeutic drug level monitoring: Secondary | ICD-10-CM | POA: Diagnosis not present

## 2023-10-12 ENCOUNTER — Encounter (HOSPITAL_COMMUNITY): Payer: Self-pay | Admitting: Oral Surgery

## 2023-10-12 ENCOUNTER — Other Ambulatory Visit: Payer: Self-pay

## 2023-10-12 NOTE — Progress Notes (Signed)
 PCP - Jerilyn CHRISTELLA Hummer, FNP Cardiologist - Denies Pulmonologist -  EKG - DOS Chest x-ray - 05/23/2021 [facility] ECHO - 5 years ago  Cardiac Cath - Denies  Sleep Study- 09/26/2022 CPAP - Yes   DM Type II Fasting Blood Sugar:  Not sure Checks Blood Sugar:  not checking  Blood Thinner Instructions: Denies Aspirin Instructions: Denies  ERAS Protcol - N/A COVID TEST- N/A  Anesthesia review: Yes  -------------  SDW INSTRUCTIONS:  Your procedure is scheduled on Monday Aug 18th. Please report to [facility] Main Entrance A at 0915 A.M., and check in at the Admitting office. Call this number if you have problems the morning of surgery: [phone]   Remember: Do not eat or drink after midnight the night before your surgery   Medications to take morning of surgery with a sip of water include: atorvastatin  (LIPITOR)  azelastine  (ASTELIN ) 0.1 % nasal spray  Budeson-Glycopyrrol-Formoterol  (BREZTRI  AEROSPHERE)  gabapentin  (NEURONTIN )  levothyroxine  (SYNTHROID )  omeprazole  (PRILOSEC)   IF NEEDED albuterol  (ACCUNEB )  ipratropium-albuterol  (DUONEB)  oxyCODONE    As of today, STOP taking any Aspirin (unless otherwise instructed by your surgeon), Aleve, Naproxen, Ibuprofen, Motrin, Advil, Goody's, BC's, all herbal medications, fish oil, and all vitamins. STOP meloxicam  (MOBIC )     The Morning of Surgery Do not wear jewelry, make-up or nail polish. Do not wear lotions, powders, or perfumes/colognes, or deodorant Do not bring valuables to the hospital. [facility] is not responsible for any belongings or valuables.  If you are a smoker, DO NOT Smoke 24 hours prior to surgery  If you wear a CPAP at night please bring your mask the morning of surgery   Remember that you must have someone to transport you home after your surgery, and remain with you for 24 hours if you are discharged the same day.  Please bring cases for contacts, glasses, hearing aids, dentures or bridgework  because it cannot be worn into surgery.   Patients discharged the day of surgery will not be allowed to drive home.   Please shower the NIGHT BEFORE/MORNING OF SURGERY (use antibacterial soap like DIAL soap if possible). Wear comfortable clothes the morning of surgery. Oral Hygiene is also important to reduce your risk of infection.  Remember - BRUSH YOUR TEETH THE MORNING OF SURGERY WITH YOUR REGULAR TOOTHPASTE  Patient denies shortness of breath, fever, cough and chest pain.
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
## 2023-10-12 NOTE — Anesthesia Preprocedure Evaluation (Signed)
 Anesthesia Evaluation    Airway        Dental   Pulmonary Current Smoker          Cardiovascular hypertension,      Neuro/Psych    GI/Hepatic   Endo/Other  diabetes    Renal/GU      Musculoskeletal   Abdominal   Peds  Hematology   Anesthesia Other Findings   Reproductive/Obstetrics                              Anesthesia Physical Anesthesia Plan  ASA:   Anesthesia Plan:    Post-op Pain Management:    Induction:   PONV Risk Score and Plan:   Airway Management Planned:   Additional Equipment:   Intra-op Plan:   Post-operative Plan:   Informed Consent:   Plan Discussed with:   Anesthesia Plan Comments: (PAT note written 10/12/2023 by Seymone Forlenza, PA-C.  )        Anesthesia Quick Evaluation

## 2023-10-15 ENCOUNTER — Encounter (HOSPITAL_COMMUNITY): Payer: Self-pay | Admitting: Vascular Surgery

## 2023-10-15 ENCOUNTER — Encounter (HOSPITAL_COMMUNITY)
Admission: RE | Disposition: A | Discharge status: Home / Self Care | Payer: Self-pay | Source: Home / Self Care | Attending: Oral Surgery

## 2023-10-15 ENCOUNTER — Ambulatory Visit (HOSPITAL_COMMUNITY)
Admission: RE | Admit: 2023-10-15 | Discharge: 2023-10-15 | Disposition: A | Discharge status: Home / Self Care | Attending: Oral Surgery | Admitting: Oral Surgery

## 2023-10-15 DIAGNOSIS — I509 Heart failure, unspecified: Secondary | ICD-10-CM | POA: Diagnosis not present

## 2023-10-15 DIAGNOSIS — K029 Dental caries, unspecified: Secondary | ICD-10-CM | POA: Insufficient documentation

## 2023-10-15 DIAGNOSIS — J439 Emphysema, unspecified: Secondary | ICD-10-CM | POA: Diagnosis not present

## 2023-10-15 DIAGNOSIS — E7800 Pure hypercholesterolemia, unspecified: Secondary | ICD-10-CM | POA: Diagnosis not present

## 2023-10-15 DIAGNOSIS — I2510 Atherosclerotic heart disease of native coronary artery without angina pectoris: Secondary | ICD-10-CM | POA: Insufficient documentation

## 2023-10-15 DIAGNOSIS — E039 Hypothyroidism, unspecified: Secondary | ICD-10-CM | POA: Insufficient documentation

## 2023-10-15 DIAGNOSIS — I700 Atherosclerosis of aorta: Secondary | ICD-10-CM | POA: Insufficient documentation

## 2023-10-15 DIAGNOSIS — Z5309 Procedure and treatment not carried out because of other contraindication: Secondary | ICD-10-CM | POA: Insufficient documentation

## 2023-10-15 DIAGNOSIS — E119 Type 2 diabetes mellitus without complications: Secondary | ICD-10-CM | POA: Diagnosis not present

## 2023-10-15 DIAGNOSIS — Z87891 Personal history of nicotine dependence: Secondary | ICD-10-CM | POA: Insufficient documentation

## 2023-10-15 DIAGNOSIS — Z6841 Body Mass Index (BMI) 40.0 and over, adult: Secondary | ICD-10-CM | POA: Diagnosis not present

## 2023-10-15 DIAGNOSIS — G4733 Obstructive sleep apnea (adult) (pediatric): Secondary | ICD-10-CM | POA: Diagnosis not present

## 2023-10-15 DIAGNOSIS — I110 Hypertensive heart disease with heart failure: Secondary | ICD-10-CM | POA: Insufficient documentation

## 2023-10-15 HISTORY — DX: Paralysis of vocal cords and larynx, unilateral: J38.01

## 2023-10-15 LAB — GLUCOSE, CAPILLARY: Glucose-Capillary: 288 mg/dL — ABNORMAL HIGH (ref 70–99)

## 2023-10-15 SURGERY — DENTAL RESTORATION/EXTRACTIONS
Anesthesia: General

## 2023-10-15 MED ORDER — LACTATED RINGERS IV SOLN: INTRAVENOUS | Status: DC

## 2023-10-15 MED ORDER — ORAL CARE MOUTH RINSE: 15.0000 mL | Freq: Once | OROMUCOSAL | Status: DC

## 2023-10-15 MED ORDER — INSULIN ASPART 100 UNIT/ML IJ SOLN
0.0000 [IU] | INTRAMUSCULAR | Status: DC | PRN
  Filled 2023-10-15: qty 1

## 2023-10-15 MED ORDER — CEFAZOLIN SODIUM-DEXTROSE 3-4 GM/150ML-% IV SOLN
3.0000 g | INTRAVENOUS | Status: DC
  Filled 2023-10-15: qty 150

## 2023-10-15 MED ORDER — CHLORHEXIDINE GLUCONATE 0.12 % MT SOLN
15.0000 mL | Freq: Once | OROMUCOSAL | Status: DC
  Filled 2023-10-15: qty 15

## 2023-10-15 NOTE — Progress Notes (Signed)
 Patient's room air SpO2  mostly in mid-high 80s % (nadir charted was 79% initially but I was unable to see anything that low during my interviews). Patient is asymptomatic. Patient does not wear oxygen  at home. Patient says she doesn't take her inhalers like she should. Last pulmonologist visit was last year, says she is due to see them again.  Also GLP1 agonist taken 5 days ago.  I advised patient that she is not optimized from a respiratory standpoint, and if she were to aspirate with her GLP1 use, it would be potentially catastrophic. I advised patient to schedule close follow up with either her primary doctor or her pulmonologist.  I relayed this to Dr. Sheryle as well and he is in agreement.

## 2023-10-15 NOTE — Progress Notes (Addendum)
 Patient's O2 sat was at 79% on room air.  Placed patient on 2L and she Vanisha up to 90%.  CBG was 288.  I have notified Dr. Boone and he is now at bedside.  After Dr. Boone and Dr. Sheryle spoke with the patient at bedside they have decided that the procedure will be canceled today.  Patient's caregiver brought into room and will take the patient back home.

## 2023-10-16 ENCOUNTER — Ambulatory Visit (INDEPENDENT_AMBULATORY_CARE_PROVIDER_SITE_OTHER): Admitting: Pulmonary Disease

## 2023-10-16 ENCOUNTER — Encounter: Payer: Self-pay | Admitting: Pulmonary Disease

## 2023-10-16 VITALS — BP 131/81 | HR 89 | Temp 97.8°F | Ht 64.0 in | Wt 243.6 lb

## 2023-10-16 DIAGNOSIS — J449 Chronic obstructive pulmonary disease, unspecified: Secondary | ICD-10-CM

## 2023-10-16 DIAGNOSIS — F17210 Nicotine dependence, cigarettes, uncomplicated: Secondary | ICD-10-CM

## 2023-10-16 DIAGNOSIS — J9610 Chronic respiratory failure, unspecified whether with hypoxia or hypercapnia: Secondary | ICD-10-CM

## 2023-10-16 MED ORDER — BREZTRI AEROSPHERE 160-9-4.8 MCG/ACT IN AERO: 2.0000 | INHALATION_SPRAY | Freq: Two times a day (BID) | RESPIRATORY_TRACT | 3 refills | Status: DC

## 2023-10-16 NOTE — Patient Instructions (Addendum)
 Use Breztri  twice a day  Albuterol  use as needed  Continue to work on quitting smoking  Quit smoking by Dasie Myron is the book I was showing you online  Ambulatory oximetry for oxygen  supplementation  I will see you back in about 6 weeks  Call us  with significant concerns
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
## 2023-10-19 DIAGNOSIS — R29898 Other symptoms and signs involving the musculoskeletal system: Secondary | ICD-10-CM | POA: Diagnosis not present

## 2023-10-19 DIAGNOSIS — Z789 Other specified health status: Secondary | ICD-10-CM | POA: Diagnosis not present

## 2023-10-19 DIAGNOSIS — R2681 Unsteadiness on feet: Secondary | ICD-10-CM | POA: Diagnosis not present

## 2023-10-19 DIAGNOSIS — M25661 Stiffness of right knee, not elsewhere classified: Secondary | ICD-10-CM | POA: Diagnosis not present

## 2023-10-19 DIAGNOSIS — M1712 Unilateral primary osteoarthritis, left knee: Secondary | ICD-10-CM | POA: Diagnosis not present

## 2023-10-25 DIAGNOSIS — M47812 Spondylosis without myelopathy or radiculopathy, cervical region: Secondary | ICD-10-CM | POA: Diagnosis not present

## 2023-10-26 DIAGNOSIS — Z1231 Encounter for screening mammogram for malignant neoplasm of breast: Secondary | ICD-10-CM | POA: Diagnosis not present

## 2023-10-26 NOTE — Telephone Encounter (Signed)
 Dr. Neda, will you please add a risk assessment for the right reverse total shoulder arthroplasty with interscalene block on patient? Pt seen on 10/16/23. Thanks!

## 2023-10-31 DIAGNOSIS — R0902 Hypoxemia: Secondary | ICD-10-CM | POA: Diagnosis not present

## 2023-10-31 DIAGNOSIS — E1165 Type 2 diabetes mellitus with hyperglycemia: Secondary | ICD-10-CM | POA: Diagnosis not present

## 2023-10-31 DIAGNOSIS — F17200 Nicotine dependence, unspecified, uncomplicated: Secondary | ICD-10-CM | POA: Diagnosis not present

## 2023-10-31 DIAGNOSIS — G43E19 Chronic migraine with aura, intractable, without status migrainosus: Secondary | ICD-10-CM | POA: Diagnosis not present

## 2023-11-05 DIAGNOSIS — Z1231 Encounter for screening mammogram for malignant neoplasm of breast: Secondary | ICD-10-CM | POA: Diagnosis not present

## 2023-11-05 DIAGNOSIS — Z79899 Other long term (current) drug therapy: Secondary | ICD-10-CM | POA: Diagnosis not present

## 2023-11-05 DIAGNOSIS — M5030 Other cervical disc degeneration, unspecified cervical region: Secondary | ICD-10-CM | POA: Diagnosis not present

## 2023-11-05 DIAGNOSIS — I10 Essential (primary) hypertension: Secondary | ICD-10-CM | POA: Diagnosis not present

## 2023-11-05 DIAGNOSIS — F17210 Nicotine dependence, cigarettes, uncomplicated: Secondary | ICD-10-CM | POA: Diagnosis not present

## 2023-11-05 DIAGNOSIS — M51369 Other intervertebral disc degeneration, lumbar region without mention of lumbar back pain or lower extremity pain: Secondary | ICD-10-CM | POA: Diagnosis not present

## 2023-11-05 DIAGNOSIS — N183 Chronic kidney disease, stage 3 unspecified: Secondary | ICD-10-CM | POA: Diagnosis not present

## 2023-11-05 DIAGNOSIS — E1140 Type 2 diabetes mellitus with diabetic neuropathy, unspecified: Secondary | ICD-10-CM | POA: Diagnosis not present

## 2023-11-06 NOTE — Telephone Encounter (Signed)
 Neda Jennet LABOR, MD to Me     11/05/23  8:15 PM I addended my note with the risk assessment   I have faxed ov note dated 10/16/23 to Beverley Millman.

## 2023-11-07 DIAGNOSIS — R0902 Hypoxemia: Secondary | ICD-10-CM | POA: Diagnosis not present

## 2023-11-07 DIAGNOSIS — Z794 Long term (current) use of insulin: Secondary | ICD-10-CM | POA: Diagnosis not present

## 2023-11-07 DIAGNOSIS — G43E19 Chronic migraine with aura, intractable, without status migrainosus: Secondary | ICD-10-CM | POA: Diagnosis not present

## 2023-11-07 DIAGNOSIS — E1165 Type 2 diabetes mellitus with hyperglycemia: Secondary | ICD-10-CM | POA: Diagnosis not present

## 2023-11-08 DIAGNOSIS — Z79899 Other long term (current) drug therapy: Secondary | ICD-10-CM | POA: Diagnosis not present

## 2023-11-19 DIAGNOSIS — M25661 Stiffness of right knee, not elsewhere classified: Secondary | ICD-10-CM | POA: Diagnosis not present

## 2023-11-19 DIAGNOSIS — R29898 Other symptoms and signs involving the musculoskeletal system: Secondary | ICD-10-CM | POA: Diagnosis not present

## 2023-11-19 DIAGNOSIS — R2681 Unsteadiness on feet: Secondary | ICD-10-CM | POA: Diagnosis not present

## 2023-11-19 DIAGNOSIS — Z789 Other specified health status: Secondary | ICD-10-CM | POA: Diagnosis not present

## 2023-12-03 DIAGNOSIS — Z79899 Other long term (current) drug therapy: Secondary | ICD-10-CM | POA: Diagnosis not present

## 2023-12-03 DIAGNOSIS — E1140 Type 2 diabetes mellitus with diabetic neuropathy, unspecified: Secondary | ICD-10-CM | POA: Diagnosis not present

## 2023-12-03 DIAGNOSIS — M5030 Other cervical disc degeneration, unspecified cervical region: Secondary | ICD-10-CM | POA: Diagnosis not present

## 2023-12-03 DIAGNOSIS — G4733 Obstructive sleep apnea (adult) (pediatric): Secondary | ICD-10-CM | POA: Diagnosis not present

## 2023-12-03 DIAGNOSIS — N183 Chronic kidney disease, stage 3 unspecified: Secondary | ICD-10-CM | POA: Diagnosis not present

## 2023-12-03 DIAGNOSIS — F17210 Nicotine dependence, cigarettes, uncomplicated: Secondary | ICD-10-CM | POA: Diagnosis not present

## 2023-12-03 DIAGNOSIS — I10 Essential (primary) hypertension: Secondary | ICD-10-CM | POA: Diagnosis not present

## 2023-12-03 DIAGNOSIS — M51369 Other intervertebral disc degeneration, lumbar region without mention of lumbar back pain or lower extremity pain: Secondary | ICD-10-CM | POA: Diagnosis not present

## 2023-12-03 DIAGNOSIS — Z1231 Encounter for screening mammogram for malignant neoplasm of breast: Secondary | ICD-10-CM | POA: Diagnosis not present

## 2023-12-05 DIAGNOSIS — Z5181 Encounter for therapeutic drug level monitoring: Secondary | ICD-10-CM | POA: Diagnosis not present

## 2023-12-12 DIAGNOSIS — R519 Headache, unspecified: Secondary | ICD-10-CM | POA: Diagnosis not present

## 2023-12-12 DIAGNOSIS — J45909 Unspecified asthma, uncomplicated: Secondary | ICD-10-CM | POA: Diagnosis not present

## 2023-12-12 DIAGNOSIS — Z0000 Encounter for general adult medical examination without abnormal findings: Secondary | ICD-10-CM | POA: Diagnosis not present

## 2023-12-12 DIAGNOSIS — E1165 Type 2 diabetes mellitus with hyperglycemia: Secondary | ICD-10-CM | POA: Diagnosis not present

## 2023-12-12 DIAGNOSIS — M4322 Fusion of spine, cervical region: Secondary | ICD-10-CM | POA: Diagnosis not present

## 2023-12-12 DIAGNOSIS — Z794 Long term (current) use of insulin: Secondary | ICD-10-CM | POA: Diagnosis not present

## 2023-12-18 DIAGNOSIS — E1140 Type 2 diabetes mellitus with diabetic neuropathy, unspecified: Secondary | ICD-10-CM | POA: Diagnosis not present

## 2023-12-18 DIAGNOSIS — I10 Essential (primary) hypertension: Secondary | ICD-10-CM | POA: Diagnosis not present

## 2023-12-18 DIAGNOSIS — G4733 Obstructive sleep apnea (adult) (pediatric): Secondary | ICD-10-CM | POA: Diagnosis not present

## 2023-12-18 DIAGNOSIS — K219 Gastro-esophageal reflux disease without esophagitis: Secondary | ICD-10-CM | POA: Diagnosis not present

## 2023-12-19 DIAGNOSIS — Z789 Other specified health status: Secondary | ICD-10-CM | POA: Diagnosis not present

## 2023-12-19 DIAGNOSIS — R2681 Unsteadiness on feet: Secondary | ICD-10-CM | POA: Diagnosis not present

## 2023-12-19 DIAGNOSIS — M1712 Unilateral primary osteoarthritis, left knee: Secondary | ICD-10-CM | POA: Diagnosis not present

## 2023-12-19 DIAGNOSIS — M25661 Stiffness of right knee, not elsewhere classified: Secondary | ICD-10-CM | POA: Diagnosis not present

## 2023-12-19 DIAGNOSIS — R29898 Other symptoms and signs involving the musculoskeletal system: Secondary | ICD-10-CM | POA: Diagnosis not present

## 2023-12-24 ENCOUNTER — Encounter: Payer: Self-pay | Admitting: Pulmonary Disease

## 2023-12-24 ENCOUNTER — Ambulatory Visit (INDEPENDENT_AMBULATORY_CARE_PROVIDER_SITE_OTHER): Admitting: Pulmonary Disease

## 2023-12-24 VITALS — BP 128/78 | HR 61 | Ht 64.0 in | Wt 257.0 lb

## 2023-12-24 DIAGNOSIS — G4733 Obstructive sleep apnea (adult) (pediatric): Secondary | ICD-10-CM | POA: Diagnosis not present

## 2023-12-24 DIAGNOSIS — J441 Chronic obstructive pulmonary disease with (acute) exacerbation: Secondary | ICD-10-CM

## 2023-12-24 DIAGNOSIS — J449 Chronic obstructive pulmonary disease, unspecified: Secondary | ICD-10-CM

## 2023-12-24 DIAGNOSIS — F17210 Nicotine dependence, cigarettes, uncomplicated: Secondary | ICD-10-CM

## 2023-12-24 DIAGNOSIS — J4489 Other specified chronic obstructive pulmonary disease: Secondary | ICD-10-CM | POA: Diagnosis not present

## 2023-12-24 MED ORDER — DOXYCYCLINE HYCLATE 100 MG PO TABS
100.0000 mg | ORAL_TABLET | Freq: Two times a day (BID) | ORAL | 0 refills | Status: AC
Start: 2023-12-24 — End: ?

## 2023-12-24 MED ORDER — PREDNISONE 10 MG PO TABS
10.0000 mg | ORAL_TABLET | Freq: Every day | ORAL | 0 refills | Status: AC
Start: 2023-12-24 — End: ?
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
## 2023-12-24 NOTE — Patient Instructions (Signed)
 We will give you some information to contact [employer] to set you up with oxygen   I will call in a course of antibiotics and low-dose of steroids to help with your cough and congestion  Continue using Breztri   Continue using your CPAP at night  Continue to work on quitting smoking  Follow-up in about 3 months

## 2023-12-25 ENCOUNTER — Other Ambulatory Visit: Payer: Self-pay | Admitting: Family Medicine

## 2023-12-25 DIAGNOSIS — I2510 Atherosclerotic heart disease of native coronary artery without angina pectoris: Secondary | ICD-10-CM

## 2023-12-26 ENCOUNTER — Encounter: Payer: Self-pay | Admitting: *Deleted

## 2023-12-26 NOTE — Progress Notes (Signed)
 [name]                                          MRN: [id number]   12/26/2023   The [facility] Quality Team Specialist reviewed this patient medical record for the purposes of chart review for care gap closure. The following were reviewed: chart review for care gap closure-kidney health evaluation for diabetes:eGFR  and uACR.    [facility] Quality Team

## 2024-01-16 ENCOUNTER — Other Ambulatory Visit: Payer: Self-pay | Admitting: Pulmonary Disease

## 2024-03-24 NOTE — Progress Notes (Signed)
 [name]                                          MRN: [id number]   03/24/2024   The [facility] Quality Team Specialist reviewed this patient medical record for the purposes of chart review for care gap closure. The following were reviewed: chart review for care gap closure-glycemic status assessment.    [facility] Quality Team

## 2024-04-01 ENCOUNTER — Ambulatory Visit: Admitting: Pulmonary Disease
